# Patient Record
Sex: Female | Born: 1985
Health system: Southern US, Community
[De-identification: ages and names within clinical notes are randomized; demographics above are authoritative.]

## PROBLEM LIST (undated history)

## (undated) DIAGNOSIS — F419 Anxiety disorder, unspecified: Secondary | ICD-10-CM

## (undated) DIAGNOSIS — I10 Essential (primary) hypertension: Secondary | ICD-10-CM

## (undated) HISTORY — PX: LEEP: SHX91

## (undated) HISTORY — PX: COLPOSCOPY: SHX161

---

## 2007-09-13 ENCOUNTER — Emergency Department (HOSPITAL_COMMUNITY): Admission: EM | Admit: 2007-09-13 | Discharge: 2007-09-13 | Payer: Self-pay | Admitting: Emergency Medicine

## 2008-07-02 ENCOUNTER — Emergency Department (HOSPITAL_COMMUNITY): Admission: EM | Admit: 2008-07-02 | Discharge: 2008-07-02 | Payer: Self-pay | Admitting: Emergency Medicine

## 2008-10-20 ENCOUNTER — Emergency Department (HOSPITAL_COMMUNITY): Admission: EM | Admit: 2008-10-20 | Discharge: 2008-10-20 | Payer: Self-pay | Admitting: Family Medicine

## 2009-05-24 ENCOUNTER — Emergency Department (HOSPITAL_COMMUNITY): Admission: EM | Admit: 2009-05-24 | Discharge: 2009-05-25 | Payer: Self-pay | Admitting: Emergency Medicine

## 2009-05-31 ENCOUNTER — Inpatient Hospital Stay (HOSPITAL_COMMUNITY): Admission: AD | Admit: 2009-05-31 | Discharge: 2009-06-01 | Payer: Self-pay | Admitting: Obstetrics and Gynecology

## 2009-07-08 DIAGNOSIS — I10 Essential (primary) hypertension: Secondary | ICD-10-CM | POA: Insufficient documentation

## 2009-07-08 LAB — CONVERTED CEMR LAB

## 2009-07-12 ENCOUNTER — Encounter (INDEPENDENT_AMBULATORY_CARE_PROVIDER_SITE_OTHER): Payer: Self-pay | Admitting: Nurse Practitioner

## 2009-07-14 ENCOUNTER — Emergency Department (HOSPITAL_COMMUNITY): Admission: EM | Admit: 2009-07-14 | Discharge: 2009-07-14 | Payer: Self-pay | Admitting: Emergency Medicine

## 2009-10-14 ENCOUNTER — Ambulatory Visit: Payer: Self-pay | Admitting: Nurse Practitioner

## 2009-10-14 DIAGNOSIS — F17200 Nicotine dependence, unspecified, uncomplicated: Secondary | ICD-10-CM | POA: Insufficient documentation

## 2009-10-20 ENCOUNTER — Encounter (INDEPENDENT_AMBULATORY_CARE_PROVIDER_SITE_OTHER): Payer: Self-pay | Admitting: Nurse Practitioner

## 2009-10-20 LAB — CONVERTED CEMR LAB
ALT: 12 units/L (ref 0–35)
AST: 17 units/L (ref 0–37)
Albumin: 4.3 g/dL (ref 3.5–5.2)
Alkaline Phosphatase: 47 units/L (ref 39–117)
BUN: 10 mg/dL (ref 6–23)
CO2: 17 meq/L — ABNORMAL LOW (ref 19–32)
Calcium: 8.5 mg/dL (ref 8.4–10.5)
Chloride: 108 meq/L (ref 96–112)
Cholesterol: 126 mg/dL (ref 0–200)
Creatinine, Ser: 0.72 mg/dL (ref 0.40–1.20)
Glucose, Bld: 84 mg/dL (ref 70–99)
HDL: 40 mg/dL (ref >39)
LDL Cholesterol: 77 mg/dL (ref 0–99)
Microalb, Ur: 0.5 mg/dL (ref 0.00–1.89)
Potassium: 4.3 meq/L (ref 3.5–5.3)
Sodium: 137 meq/L (ref 135–145)
TSH: 1.123 microintl units/mL (ref 0.350–4.500)
Total Bilirubin: 0.2 mg/dL — ABNORMAL LOW (ref 0.3–1.2)
Total CHOL/HDL Ratio: 3.2
Total Protein: 7 g/dL (ref 6.0–8.3)
Triglycerides: 47 mg/dL (ref <150)
VLDL: 9 mg/dL (ref 0–40)

## 2009-11-11 ENCOUNTER — Ambulatory Visit: Payer: Self-pay | Admitting: Nurse Practitioner

## 2010-02-02 ENCOUNTER — Ambulatory Visit: Payer: Self-pay | Admitting: Nurse Practitioner

## 2010-04-26 ENCOUNTER — Ambulatory Visit: Payer: Self-pay | Admitting: Nurse Practitioner

## 2010-05-22 ENCOUNTER — Emergency Department (HOSPITAL_COMMUNITY): Admission: EM | Admit: 2010-05-22 | Discharge: 2010-05-22 | Payer: Self-pay | Admitting: Family Medicine

## 2010-07-05 ENCOUNTER — Ambulatory Visit: Payer: Self-pay | Admitting: Nurse Practitioner

## 2010-07-05 LAB — CONVERTED CEMR LAB
Basophils Absolute: 0 10*3/uL (ref 0.0–0.1)
Basophils Relative: 0 % (ref 0–1)
Bilirubin Urine: NEGATIVE
Blood in Urine, dipstick: NEGATIVE
Chlamydia, DNA Probe: NEGATIVE
Eosinophils Absolute: 0.1 10*3/uL (ref 0.0–0.7)
Eosinophils Relative: 1 % (ref 0–5)
GC Probe Amp, Genital: NEGATIVE
Glucose, Urine, Semiquant: NEGATIVE
HCT: 36.9 % (ref 36.0–46.0)
Hemoglobin: 12.3 g/dL (ref 12.0–15.0)
KOH Prep: NEGATIVE
Ketones, urine, test strip: NEGATIVE
Lymphocytes Relative: 49 % — ABNORMAL HIGH (ref 12–46)
Lymphs Abs: 3.8 10*3/uL (ref 0.7–4.0)
MCHC: 33.3 g/dL (ref 30.0–36.0)
MCV: 86 fL (ref 78.0–100.0)
Monocytes Absolute: 0.6 10*3/uL (ref 0.1–1.0)
Monocytes Relative: 8 % (ref 3–12)
Neutro Abs: 3.2 10*3/uL (ref 1.7–7.7)
Neutrophils Relative %: 42 % — ABNORMAL LOW (ref 43–77)
Nitrite: NEGATIVE
Platelets: 224 10*3/uL (ref 150–400)
Protein, U semiquant: NEGATIVE
RBC: 4.29 M/uL (ref 3.87–5.11)
RDW: 15 % (ref 11.5–15.5)
Specific Gravity, Urine: <1.005
Urobilinogen, UA: 0.2
WBC Urine, dipstick: NEGATIVE
WBC: 7.7 10*3/uL (ref 4.0–10.5)
pH: 6

## 2010-07-06 ENCOUNTER — Encounter (INDEPENDENT_AMBULATORY_CARE_PROVIDER_SITE_OTHER): Payer: Self-pay | Admitting: Nurse Practitioner

## 2010-07-11 LAB — CONVERTED CEMR LAB: Pap Smear: NEGATIVE

## 2010-07-18 ENCOUNTER — Ambulatory Visit: Payer: Self-pay | Admitting: Nurse Practitioner

## 2010-10-06 ENCOUNTER — Telehealth (INDEPENDENT_AMBULATORY_CARE_PROVIDER_SITE_OTHER): Payer: Self-pay | Admitting: Nurse Practitioner

## 2010-10-13 ENCOUNTER — Ambulatory Visit
Admission: RE | Admit: 2010-10-13 | Discharge: 2010-10-13 | Payer: Self-pay | Source: Home / Self Care | Attending: Nurse Practitioner | Admitting: Nurse Practitioner

## 2010-11-09 NOTE — Assessment & Plan Note (Signed)
Summary: Complete Physical Exam   Vital Signs:  Patient profile:   25 year old female Menstrual status:  depo x 6 years no cycle Weight:      149.1 pounds Temp:     98.7 degrees F oral Pulse rate:   88 / minute Pulse rhythm:   regular Resp:     20 per minute BP sitting:   130 / 76  (left arm) Cuff size:   regular  Vitals Entered By: Levon Hedger (July 05, 2010 9:36 AM) CC: CPP Is Patient Diabetic? No Pain Assessment Patient in pain? no       Does patient need assistance? Functional Status Self care Ambulation Normal   CC:  CPP.  History of Present Illness:  Pt into the office for a complete physical exam.  PAP - last done 1 year ago at [facility]. Pt had some abnormal cells for which returned normal.  No children Pt is sexually active Family Planning - pt is on depoprovera.  No menses.  started in 2005 No family history of cervical or ovarian cancer.  Mammogram - no history of mammogram Pt does do self breast exams at home no family history of breast cancer  Optho - pt does not wear glasses, no current problems with her eyes.  Dental - upcoming dental appt.  last dental appt 4 months ago. she maintains appt every 6 months  Habits & Providers  Alcohol-Tobacco-Diet     Alcohol drinks/day: <1     Alcohol Counseling: to decrease amount and/or frequency of alcohol intake     Tobacco Status: current     Tobacco Counseling: to quit use of tobacco products     Cigarette Packs/Day: <0.25  Exercise-Depression-Behavior     Have you felt down or hopeless? no     Have you felt little pleasure in things? no     Depression Counseling: not indicated; screening negative for depression     Drug Use: never  Comments: PHQ-9 scan 1  Allergies (verified): No Known Drug Allergies  Review of Systems General:  Denies fever. Eyes:  Denies discharge. ENT:  Denies earache. CV:  Denies chest pain or discomfort. Resp:  Denies cough. GI:  Denies abdominal pain, nausea,  and vomiting. Derm:  Denies rash. Neuro:  Denies headaches. Psych:  Denies anxiety and depression.  Physical Exam  General:  alert.  alert.   Head:  normocephalic.  normocephalic.   Eyes:  pupils equal, pupils round, and pupils reactive to light.  pupils equal, pupils round, and pupils reactive to light.   Ears:  bil TM with bony landmarks Nose:  no nasal discharge.  no nasal discharge.   Mouth:  pharynx pink and moist.  pharynx pink and moist.   Neck:  supple.  supple.   Chest Wall:  no mass.  no mass.   Breasts:  skin/areolae normal, no masses, and no abnormal thickening.  skin/areolae normal, no masses, and no abnormal thickening.   Lungs:  normal breath sounds.  normal breath sounds.   Heart:  normal rate and regular rhythm.  normal rate and regular rhythm.   Abdomen:  soft, non-tender, and normal bowel sounds.  soft, non-tender, and normal bowel sounds.   Rectal:  defer Msk:  up to the exam table Pulses:  R radial normal and L radial normal.  R radial normal and L radial normal.   Neurologic:  alert & oriented X3, cranial nerves II-XII intact, and gait normal.  alert & oriented X3, cranial nerves  II-XII intact, and gait normal.   Skin:  color normal.  color normal.   Psych:  Oriented X3.  Oriented X3.    Pelvic Exam  Vulva:      normal appearance.   Urethra and Bladder:      Urethra--normal.   Vagina:      physiologic discharge, curdlike discharge.   Cervix:      midposition.   Uterus:      smooth.   Adnexa:      nontender bilaterally.      Impression & Recommendations:  Problem # 1:  ROUTINE GYNECOLOGICAL EXAMINATION (ICD-V72.31) PHQ- 9 score = 1 labs done previously; will repeat cbc rec optho and dental results  Problem # 2:  HYPERTENSION, BENIGN ESSENTIAL (ICD-401.1)  BP is doing better. Continue current medications DASH diet Her updated medication list for this problem includes:    Lisinopril 10 Mg Tabs (Lisinopril) ..... One tablet by mouth for blood  pressure  Her updated medication list for this problem includes:    Lisinopril 10 Mg Tabs (Lisinopril) ..... One tablet by mouth for blood pressure  Problem # 3:  TOBACCO ABUSE (ICD-305.1) advised total cessation  Problem # 4:  FAMILY PLANNING (ICD-V25.09) pt is on depoprovera answered questions related to long term use pt advised about smoking and depoprovera She would like to transition to oral contraceptives but would like to research further advised condom use to protect against STD  Complete Medication List: 1)  Lisinopril 10 Mg Tabs (Lisinopril) .... One tablet by mouth for blood pressure 2)  Depo-provera 150 Mg/ml Susp (Medroxyprogesterone acetate) .... One injection im  every 12 weeks  Other Orders: UA Dipstick w/o Micro (manual) (91478) KOH/ WET Mount [phone]) Pap Smear, Thin Prep ( Collection of) (Z3086) T-CBC w/Diff [phone]) T- GC Chlamydia (28413)  Patient Instructions: 1)  Depoprovera has been sent to [facility] pharmacy.  You should get this before your next injection is due. 2)  You will be informed of your lab results 3)  follow up as needed Prescriptions: LISINOPRIL 10 MG TABS (LISINOPRIL) One tablet by mouth for blood pressure  #30 x 5   Entered and Authorized by:   Lehman Prom FNP   Signed by:   Lehman Prom FNP on 07/05/2010   Method used:   Print then Give to Patient   RxID:   [id number] DEPO-PROVERA 150 MG/ML SUSP (MEDROXYPROGESTERONE ACETATE) One injection IM  every 12 weeks  #1 x 4   Entered and Authorized by:   Lehman Prom FNP   Signed by:   Lehman Prom FNP on 07/05/2010   Method used:   Faxed to ...       [facility] (retail)       [address] [place], [place]  [address]       Ph: [phone]       Fax: [phone]   RxID:   [id number]   Laboratory Results   Urine Tests  Date/Time Received: July 05, 2010 9:44 AM   Routine Urinalysis   Color: lt.  yellow Appearance: Clear Glucose: negative   (Normal Range: Negative) Bilirubin: negative   (Normal Range: Negative) Ketone: negative   (Normal Range: Negative) Spec. Gravity: <1.005   (Normal Range: 1.003-1.035) Blood: negative   (Normal Range: Negative) pH: 6.0   (Normal Range: 5.0-8.0) Protein: negative   (Normal Range: Negative) Urobilinogen: 0.2   (Normal Range: 0-1) Nitrite: negative   (Normal Range: Negative)  Leukocyte Esterace: negative   (Normal Range: Negative)    Date/Time Received: July 05, 2010 10:35 AM   Wet Mount/KOH Source: vaginal WBC/hpf: 1-5 Bacteria/hpf: rare Clue cells/hpf: none Yeast/hpf: few Trichomonas/hpf: none   Prevention & Chronic Care Immunizations   Influenza vaccine: Fluvax 3+  (10/14/2009)    Tetanus booster: Not documented    Pneumococcal vaccine: Not documented  Other Screening   Pap smear: done at [facility]  (07/08/2009)   Pap smear action/deferral: Ordered  (07/05/2010)   Pap smear due: 07/06/2011   Smoking status: current  (07/05/2010)   Smoking cessation counseling: yes  (07/05/2010)  Hypertension   Last Blood Pressure: 130 / 76  (07/05/2010)   Serum creatinine: 0.72  (10/14/2009)   Serum potassium 4.3  (10/14/2009)  Self-Management Support :    Hypertension self-management support: Not documented   Laboratory Results   Urine Tests    Routine Urinalysis   Color: lt. yellow Appearance: Clear Glucose: negative   (Normal Range: Negative) Bilirubin: negative   (Normal Range: Negative) Ketone: negative   (Normal Range: Negative) Spec. Gravity: <1.005   (Normal Range: 1.003-1.035) Blood: negative   (Normal Range: Negative) pH: 6.0   (Normal Range: 5.0-8.0) Protein: negative   (Normal Range: Negative) Urobilinogen: 0.2   (Normal Range: 0-1) Nitrite: negative   (Normal Range: Negative) Leukocyte Esterace: negative   (Normal Range: Negative)      Wet Mount Wet Mount KOH: Negative

## 2010-11-09 NOTE — Assessment & Plan Note (Signed)
Summary: sched on Feb 4th for bp and depo provera//gk  Nurse Visit   Vital Signs:  Patient profile:   25 year old female Menstrual status:  depo x 6 years no cycle BP sitting:   138 / 82  (left arm) Cuff size:   regular  Vitals Entered By: Geanie Cooley (November 11, 2009 9:41 AM)  Allergies: No Known Drug Allergies  Medication Administration  Injection # 1:    Medication: Depo-Provera 150mg     Diagnosis: FAMILY PLANNING (ICD-V25.09)    Route: IM    Site: LUOQ gluteus    Exp Date: 10/08/2011    Lot #: [id number]    Mfr: Pharmacia    Comments: [id number] Next shot February 02 2010    Patient tolerated injection without complications    Given by: Vesta Mixer CMA (November 11, 2009 10:31 AM)  Orders Added: 1)  Admin of Therapeutic Inj  intramuscular or subcutaneous [96372] 2)  Depo-Provera 150mg  [J1055] 3)  Est. Patient Level I [82956] 4)  EKG w/ Interpretation [93000]   Medication Administration  Injection # 1:    Medication: Depo-Provera 150mg     Diagnosis: FAMILY PLANNING (ICD-V25.09)    Route: IM    Site: LUOQ gluteus    Exp Date: 10/08/2011    Lot #: [id number]    Mfr: Pharmacia    Comments: [id number] Next shot February 02 2010    Patient tolerated injection without complications    Given by: Vesta Mixer CMA (November 11, 2009 10:31 AM)  Orders Added: 1)  Admin of Therapeutic Inj  intramuscular or subcutaneous [96372] 2)  Depo-Provera 150mg  [J1055] 3)  Est. Patient Level I [29528] 4)  EKG w/ Interpretation [93000]

## 2010-11-09 NOTE — Assessment & Plan Note (Signed)
Summary: DEPO/////KT  Nurse Visit   Allergies: No Known Drug Allergies  Medication Administration  Injection # 1:    Medication: Depo-Provera 150mg     Diagnosis: FAMILY PLANNING (ICD-V25.09)    Route: IM    Site: L deltoid    Exp Date: 02/2013    Lot #: [id number]    Mfr: greenstone    Comments: [facility] [id number]       March 30 next inj due.    Patient tolerated injection without complications    Given by: Gaylyn Cheers RN (October 13, 2010 10:58 AM)  Orders Added: 1)  Admin of patients own med IM/SQ [96372M] 2)  Est. Patient Level I [99211]   Medication Administration  Injection # 1:    Medication: Depo-Provera 150mg     Diagnosis: FAMILY PLANNING (ICD-V25.09)    Route: IM    Site: L deltoid    Exp Date: 02/2013    Lot #: [id number]    Mfr: greenstone    Comments: [facility] [id number]       March 30 next inj due.    Patient tolerated injection without complications    Given by: Gaylyn Cheers RN (October 13, 2010 10:58 AM)  Orders Added: 1)  Admin of patients own med IM/SQ [96372M] 2)  Est. Patient Level I [phone]

## 2010-11-09 NOTE — Assessment & Plan Note (Signed)
Summary: DEPO////KT  Nurse Visit   Vitals Entered By: Michelle Nasuti (July 18, 2010 10:31 AM)  Allergies: No Known Drug Allergies  Medication Administration  Injection # 1:    Medication: Depo-Provera 150mg     Diagnosis: FAMILY PLANNING (ICD-V25.09)    Route: IM    Site: RUOQ gluteus    Exp Date: 12/06/2010    Lot #: [id number]    Mfr: TEVA    Comments: NEXT DOSE DUE 10/09/2010    Patient tolerated injection without complications    Given by: Michelle Nasuti (July 18, 2010 10:33 AM)  Orders Added: 1)  Depo-Provera 150mg  [J1055] 2)  Admin of Therapeutic Inj  intramuscular or subcutaneous [96372]   Medication Administration  Injection # 1:    Medication: Depo-Provera 150mg     Diagnosis: FAMILY PLANNING (ICD-V25.09)    Route: IM    Site: RUOQ gluteus    Exp Date: 12/06/2010    Lot #: [id number]    Mfr: TEVA    Comments: NEXT DOSE DUE 10/09/2010    Patient tolerated injection without complications    Given by: Michelle Nasuti (July 18, 2010 10:33 AM)  Orders Added: 1)  Depo-Provera 150mg  [J1055] 2)  Admin of Therapeutic Inj  intramuscular or subcutaneous [78469]

## 2010-11-09 NOTE — Letter (Signed)
Summary: RECORDED BLOOD PRESSURE BY PT.  RECORDED BLOOD PRESSURE BY PT.   Imported By: Arta Bruce 11/29/2009 11:40:03  _____________________________________________________________________  External Attachment:    Type:   Image     Comment:   External Document

## 2010-11-09 NOTE — Letter (Signed)
Summary: PT INFORMATION SHEET  PT INFORMATION SHEET   Imported By: Arta Bruce 12/22/2009 10:45:06  _____________________________________________________________________  External Attachment:    Type:   Image     Comment:   External Document

## 2010-11-09 NOTE — Assessment & Plan Note (Signed)
Summary: New - Establish Care   Vital Signs:  Patient profile:   24 year old female Menstrual status:  depo x 6 years no cycle Height:      63.25 inches Weight:      141.4 pounds BMI:     24.94 BSA:     1.68 Temp:     97.7 degrees F oral Pulse rate:   74 / minute Pulse rhythm:   regular Resp:     20 per minute BP sitting:   133 / 92  (left arm) Cuff size:   regular  Vitals Entered By: Levon Hedger (October 14, 2009 10:30 AM)  O2 Flow:  de CC: new HTN, Hypertension Management Is Patient Diabetic? No Pain Assessment Patient in pain? no       Does patient need assistance? Functional Status Self care Ambulation Normal     Menstrual Status depo x 6 years no cycle Last PAP Result done at [facility]   CC:  new HTN and Hypertension Management.  History of Present Illness:  Pt into the office to establish care. Pt did not have a PCP. [facility]  Family Planning - Pt has been on Depoprovera since 2005  Currently on Depoprovera (presents with card from [facility] department with depoprovera injections) Last was done on 08/19/2009 Next depoprovera was 2/4/22011  Hypertension History:      She denies headache, chest pain, and palpitations.  Pt was prescribed lisinopril/HCTZ 20/12.5 in October and she took it for 1 month and then did not go to f/u appt so she did not take it.        Positive major cardiovascular risk factors include hypertension and current tobacco user.  Negative major cardiovascular risk factors include female age less than 3 years old.     Habits & Providers  Alcohol-Tobacco-Diet     Alcohol drinks/day: <1     Tobacco Status: current     Tobacco Counseling: to quit use of tobacco products     Cigarette Packs/Day: <0.25  Exercise-Depression-Behavior     Have you felt down or hopeless? no     Have you felt little pleasure in things? no     Drug Use: never  Allergies (verified): No Known Drug Allergies  Past  History:  Past Surgical History: none  Family History: father - deceased (murder) mother - healthy  Social History: no children Smoking Status:  current Drug Use:  never Packs/Day:  <0.25  Review of Systems General:  Denies fever. CV:  Denies chest pain or discomfort. Resp:  Denies cough. GI:  Denies abdominal pain, nausea, and vomiting.  Physical Exam  General:  alert.   Head:  normocephalic.   Ears:  ear piercing(s) noted.   Lungs:  normal breath sounds.   Heart:  normal rate and regular rhythm.   Abdomen:  normal bowel sounds.   Msk:  normal ROM.   Neurologic:  alert & oriented X3.     Impression & Recommendations:  Problem # 1:  HYPERTENSION, BENIGN ESSENTIAL (ICD-401.1) pt has started lifestyle modifications will check labs continue bp meds Her updated medication list for this problem includes:    Lisinopril 10 Mg Tabs (Lisinopril) ..... One tablet by mouth for blood pressure  Orders: T-TSH ([address]) T-Comprehensive Metabolic Panel [phone]) T-Lipid Profile (29562-13086) T-CBC w/Diff (57846-96295) T-Syphilis Test (RPR) (28413-24401) Rapid HIV  (92370) UA Dipstick w/o Micro (manual) (02725) T-Urine Microalbumin w/creat. ratio [phone])  Problem # 2:  TOBACCO ABUSE (ICD-305.1) advised cessation  Problem # 3:  FAMILY PLANNING (ICD-V25.09) spoke at length to pt about depoprovera, risk factors with htn and smoking  Complete Medication List: 1)  Lisinopril 10 Mg Tabs (Lisinopril) .... One tablet by mouth for blood pressure 2)  Depo-provera 150 Mg/ml Susp (Medroxyprogesterone acetate)  Hypertension Assessment/Plan:      The patient's hypertensive risk group is category B: At least one risk factor (excluding diabetes) with no target organ damage.  Today's blood pressure is 133/92.  Her blood pressure goal is < 140/90.  Patient Instructions: 1)  Schedule your next appointment on February 4th for high blood pressure and depoprovera 2)   Take your medications before this visit. 3)  You will get an EKG. 4)  You have recieved a flu vaccine today 5)  Stop Smoking Prescriptions: LISINOPRIL 10 MG TABS (LISINOPRIL) One tablet by mouth for blood pressure  #30 x 1   Entered and Authorized by:   Lehman Prom FNP   Signed by:   Lehman Prom FNP on 10/14/2009   Method used:   Print then Give to Patient   RxID:   [id number]   Appended Document: New - Establish Care     Allergies: No Known Drug Allergies   Complete Medication List: 1)  Lisinopril 10 Mg Tabs (Lisinopril) .... One tablet by mouth for blood pressure 2)  Depo-provera 150 Mg/ml Susp (Medroxyprogesterone acetate)  Other Orders: Flu Vaccine 35yrs + (95284) Admin 1st Vaccine (13244) Admin 1st Vaccine [facility]) [phone])    Influenza Vaccine    Vaccine Type: Fluvax 3+    Site: left deltoid    Mfr: Sanofi Pasteur    Dose: 0.5 ml    Route: IM    Given by: Levon Hedger    Exp. Date: 04/06/2010    Lot #: [id number]    VIS given: 05/01/07 version given October 14, 2009.  Flu Vaccine Consent Questions    Do you have a history of severe allergic reactions to this vaccine? no    Any prior history of allergic reactions to egg and/or gelatin? no    Do you have a sensitivity to the preservative Thimersol? no    Do you have a past history of Guillan-Barre Syndrome? no    Do you currently have an acute febrile illness? no    Have you ever had a severe reaction to latex? no    Vaccine information given and explained to patient? yes    Are you currently pregnant? no   Laboratory Results  Date/Time Received: October 14, 2009 4:50 PM  Date/Time Reported: October 14, 2009 4:50 PM   Other Tests  Rapid HIV: negative

## 2010-11-09 NOTE — Progress Notes (Signed)
Summary: Office Visit//DEPRESSION SCREENING  Office Visit//DEPRESSION SCREENING   Imported By: Arta Bruce 07/06/2010 11:45:33  _____________________________________________________________________  External Attachment:    Type:   Image     Comment:   External Document

## 2010-11-09 NOTE — Progress Notes (Signed)
Summary: Needs Depo refills  Phone Note Call from Patient   Summary of Call: PT NEEDS HER DEPO CALLED INTO [address]//[phone] Initial call taken by: Arta Bruce,  October 06, 2010 3:26 PM  Follow-up for Phone Call        Pt. still has refills available.  Left message on answering machine for pt. to return call.  Dutch Quint RN  October 06, 2010 4:01 PM  Left message on answering machine for pt. to return call.  Dutch Quint RN  October 10, 2010 3:32 PM  Pt. advised of available refills -- she will call [place] pharmacy to fill for 10/13/10 depo appt.  Dutch Quint RN  October 10, 2010 4:16 PM

## 2010-11-09 NOTE — Letter (Signed)
Summary: SLIDING SCALE FEE  SLIDING SCALE FEE   Imported By: Arta Bruce 12/22/2009 12:24:42  _____________________________________________________________________  External Attachment:    Type:   Image     Comment:   External Document

## 2010-11-09 NOTE — Assessment & Plan Note (Signed)
Summary: DEPO////////KT  Nurse Visit   Allergies: No Known Drug Allergies  Medication Administration  Injection # 1:    Medication: Depo-Provera 150mg     Diagnosis: FAMILY PLANNING (ICD-V25.09)    Route: IM    Site: LUOQ gluteus    Exp Date: 02/2012    Lot #: [id number]    Mfr: Pharmacia    Comments: next depo shot is Oct. 11    Patient tolerated injection without complications    Given by: Arm[place] Shannon (April 26, 2010 8:45 AM)  Orders Added: 1)  Est. Patient Level I [11914] 2)  Admin of Therapeutic Inj  intramuscular or subcutaneous [96372] 3)  Depo-Provera 150mg  [J1055]   Medication Administration  Injection # 1:    Medication: Depo-Provera 150mg     Diagnosis: FAMILY PLANNING (ICD-V25.09)    Route: IM    Site: LUOQ gluteus    Exp Date: 02/2012    Lot #: [id number]    Mfr: Pharmacia    Comments: next depo shot is Oct. 11    Patient tolerated injection without complications    Given by: Armenia Shannon (April 26, 2010 8:45 AM)  Orders Added: 1)  Est. Patient Level I [78295] 2)  Admin of Therapeutic Inj  intramuscular or subcutaneous [96372] 3)  Depo-Provera 150mg  [J1055]

## 2010-11-09 NOTE — Assessment & Plan Note (Signed)
Summary: DEPO SHOT//GK  Nurse Visit   Allergies: No Known Drug Allergies  Medication Administration  Injection # 1:    Medication: Depo-Provera 150mg     Diagnosis: FAMILY PLANNING (ICD-V25.09)    Route: IM    Site: RUOQ gluteus    Exp Date: 03/07/2012    Lot #: [id number]    Mfr: Pharmacia    Comments: next shot due July 20 [id number]    Patient tolerated injection without complications    Given by: Vesta Mixer CMA (February 02, 2010 8:36 AM)  Orders Added: 1)  Depo-Provera 150mg  [J1055] 2)  Admin of Therapeutic Inj  intramuscular or subcutaneous [96372]   Medication Administration  Injection # 1:    Medication: Depo-Provera 150mg     Diagnosis: FAMILY PLANNING (ICD-V25.09)    Route: IM    Site: RUOQ gluteus    Exp Date: 03/07/2012    Lot #: [id number]    Mfr: Pharmacia    Comments: next shot due July 20 [id number]    Patient tolerated injection without complications    Given by: Vesta Mixer CMA (February 02, 2010 8:36 AM)  Orders Added: 1)  Depo-Provera 150mg  [J1055] 2)  Admin of Therapeutic Inj  intramuscular or subcutaneous [96372]  Appended Document: DEPO SHOT//GK   Appended Document: DEPO SHOT//GK

## 2010-11-09 NOTE — Letter (Signed)
Summary: *HSN Results Follow up  [facility]  [address] [place], [place] [address]   Phone: [phone]  Fax: [phone]      10/20/2009   Jyoti Riehl [address] [place], [place]  [address]   Dear  Ms. Roney Jaffe Rispoli,                            ____S.Drinkard,FNP   ____D. Gore,FNP       ____B. McPherson,MD   ____V. Rankins,MD    ____E. Mulberry,MD    __X__N. Daphine Deutscher, FNP  ____D. Reche Dixon, MD    ____K. Philipp Deputy, MD    ____Other     This letter is to inform you that your recent test(s):  _______Pap Smear    __X_____Lab Test     _______X-ray    ____X___ is within acceptable limits  _______ requires a medication change  _______ requires a follow-up lab visit  _______ requires a follow-up visit with your provider   Comments: Labs done during recent office visit are ok.       _________________________________________________________ If you have any questions, please contact our office [phone].                    Sincerely,    Lehman Prom FNP [facility]

## 2010-11-09 NOTE — Letter (Signed)
Summary: *HSN Results Follow up  [facility]  [address] [place], [place] [address]   Phone: [phone]  Fax: [phone]      07/06/2010   Kelsey Joyce [address] [place], [place]  [address]   Dear  Ms. Roney Jaffe Huante,                            ____S.Drinkard,FNP   ____D. Gore,FNP       ____B. McPherson,MD   ____V. Rankins,MD    ____E. Mulberry,MD    __X__N. Daphine Deutscher, FNP  ____D. Reche Dixon, MD    ____K. Philipp Deputy, MD    ____Other     This letter is to inform you that your recent test(s):  _______Pap Smear    ___X____Lab Test     _______X-ray    ___X____ is within acceptable limits  _______ requires a medication change  _______ requires a follow-up lab visit  _______ requires a follow-up visit with your provider   Comments: Labs done during recent office visit are normal.  Pap Smear results _________________________.       _________________________________________________________ If you have any questions, please contact our office [phone].                    Sincerely,    Lehman Prom FNP [facility]

## 2010-12-22 LAB — POCT URINALYSIS DIPSTICK
Bilirubin Urine: NEGATIVE
Glucose, UA: NEGATIVE mg/dL
Ketones, ur: NEGATIVE mg/dL
Nitrite: NEGATIVE
Protein, ur: NEGATIVE mg/dL
Specific Gravity, Urine: 1.015 (ref 1.005–1.030)
Urobilinogen, UA: 0.2 mg/dL (ref 0.0–1.0)
pH: 6 (ref 5.0–8.0)

## 2010-12-22 LAB — POCT PREGNANCY, URINE: Preg Test, Ur: NEGATIVE

## 2011-01-13 LAB — DIFFERENTIAL
Basophils Absolute: 0 10*3/uL (ref 0.0–0.1)
Basophils Relative: 0 % (ref 0–1)
Eosinophils Absolute: 0 10*3/uL (ref 0.0–0.7)
Eosinophils Relative: 1 % (ref 0–5)
Lymphocytes Relative: 54 % — ABNORMAL HIGH (ref 12–46)
Lymphs Abs: 4.6 10*3/uL — ABNORMAL HIGH (ref 0.7–4.0)
Monocytes Absolute: 0.7 10*3/uL (ref 0.1–1.0)
Monocytes Relative: 8 % (ref 3–12)
Neutro Abs: 3.2 10*3/uL (ref 1.7–7.7)
Neutrophils Relative %: 37 % — ABNORMAL LOW (ref 43–77)

## 2011-01-13 LAB — URINE MICROSCOPIC-ADD ON

## 2011-01-13 LAB — CBC
HCT: 35.5 % — ABNORMAL LOW (ref 36.0–46.0)
Hemoglobin: 12 g/dL (ref 12.0–15.0)
MCHC: 33.9 g/dL (ref 30.0–36.0)
MCV: 88.9 fL (ref 78.0–100.0)
Platelets: 177 10*3/uL (ref 150–400)
RBC: 3.99 MIL/uL (ref 3.87–5.11)
RDW: 13.9 % (ref 11.5–15.5)
WBC: 8.6 10*3/uL (ref 4.0–10.5)

## 2011-01-13 LAB — URINALYSIS, ROUTINE W REFLEX MICROSCOPIC
Bilirubin Urine: NEGATIVE
Bilirubin Urine: NEGATIVE
Glucose, UA: NEGATIVE mg/dL
Glucose, UA: NEGATIVE mg/dL
Hgb urine dipstick: NEGATIVE
Ketones, ur: NEGATIVE mg/dL
Ketones, ur: 15 mg/dL — AB
Leukocytes, UA: NEGATIVE
Nitrite: NEGATIVE
Nitrite: NEGATIVE
Protein, ur: NEGATIVE mg/dL
Protein, ur: NEGATIVE mg/dL
Specific Gravity, Urine: 1.014 (ref 1.005–1.030)
Specific Gravity, Urine: >1.03 — ABNORMAL HIGH (ref 1.005–1.030)
Urobilinogen, UA: 1 mg/dL (ref 0.0–1.0)
Urobilinogen, UA: 1 mg/dL (ref 0.0–1.0)
pH: 6.5 (ref 5.0–8.0)
pH: 6 (ref 5.0–8.0)

## 2011-01-13 LAB — PREGNANCY, URINE: Preg Test, Ur: NEGATIVE

## 2011-01-13 LAB — WET PREP, GENITAL: Trich, Wet Prep: NONE SEEN

## 2011-01-13 LAB — POCT PREGNANCY, URINE: Preg Test, Ur: POSITIVE

## 2011-01-13 LAB — ABO/RH: ABO/RH(D): A POS

## 2011-01-13 LAB — GC/CHLAMYDIA PROBE AMP, GENITAL
Chlamydia, DNA Probe: NEGATIVE
GC Probe Amp, Genital: NEGATIVE

## 2011-01-13 LAB — HCG, QUANTITATIVE, PREGNANCY: hCG, Beta Chain, Quant, S: <2 m[IU]/mL (ref <5)

## 2011-02-11 ENCOUNTER — Inpatient Hospital Stay (INDEPENDENT_AMBULATORY_CARE_PROVIDER_SITE_OTHER)
Admission: RE | Admit: 2011-02-11 | Discharge: 2011-02-11 | Disposition: A | Discharge status: Home / Self Care | Payer: Self-pay | Source: Ambulatory Visit | Attending: Family Medicine | Admitting: Family Medicine

## 2011-02-11 ENCOUNTER — Ambulatory Visit (INDEPENDENT_AMBULATORY_CARE_PROVIDER_SITE_OTHER): Payer: Self-pay

## 2011-02-11 DIAGNOSIS — R197 Diarrhea, unspecified: Secondary | ICD-10-CM

## 2011-02-11 DIAGNOSIS — H6690 Otitis media, unspecified, unspecified ear: Secondary | ICD-10-CM

## 2011-02-11 LAB — POCT URINALYSIS DIP (DEVICE)
Bilirubin Urine: NEGATIVE
Glucose, UA: NEGATIVE mg/dL
Ketones, ur: NEGATIVE mg/dL
Nitrite: NEGATIVE
Protein, ur: NEGATIVE mg/dL
Specific Gravity, Urine: 1.025 (ref 1.005–1.030)
Urobilinogen, UA: 0.2 mg/dL (ref 0.0–1.0)
pH: 6 (ref 5.0–8.0)

## 2011-02-11 LAB — POCT PREGNANCY, URINE: Preg Test, Ur: NEGATIVE

## 2011-05-07 ENCOUNTER — Inpatient Hospital Stay (INDEPENDENT_AMBULATORY_CARE_PROVIDER_SITE_OTHER)
Admission: RE | Admit: 2011-05-07 | Discharge: 2011-05-07 | Disposition: A | Discharge status: Home / Self Care | Payer: Self-pay | Source: Ambulatory Visit | Attending: Family Medicine | Admitting: Family Medicine

## 2011-05-07 DIAGNOSIS — J069 Acute upper respiratory infection, unspecified: Secondary | ICD-10-CM

## 2011-07-09 LAB — PREGNANCY, URINE: Preg Test, Ur: NEGATIVE

## 2011-07-09 LAB — HEPATIC FUNCTION PANEL
ALT: 17
AST: 25
Albumin: 4.2
Alkaline Phosphatase: 54
Bilirubin, Direct: 0.1
Indirect Bilirubin: 0.6
Total Bilirubin: 0.7
Total Protein: 7.6

## 2011-07-09 LAB — CBC
HCT: 38.5
Hemoglobin: 12.5
MCHC: 32.4
MCV: 88
Platelets: 181
RBC: 4.37
RDW: 13.7
WBC: 8.4

## 2011-07-09 LAB — DIFFERENTIAL
Basophils Absolute: 0
Basophils Relative: 0
Eosinophils Absolute: 0
Eosinophils Relative: 0
Lymphocytes Relative: 22
Lymphs Abs: 1.9
Monocytes Absolute: 0.2
Monocytes Relative: 2 — ABNORMAL LOW
Neutro Abs: 6.3
Neutrophils Relative %: 75

## 2011-07-09 LAB — BASIC METABOLIC PANEL
BUN: 7
CO2: 20
Calcium: 9.2
Chloride: 113 — ABNORMAL HIGH
Creatinine, Ser: 0.75
GFR calc Af Amer: >60
GFR calc non Af Amer: >60
Glucose, Bld: 97
Potassium: 3.8
Sodium: 144

## 2011-07-09 LAB — URINALYSIS, ROUTINE W REFLEX MICROSCOPIC
Bilirubin Urine: NEGATIVE
Glucose, UA: NEGATIVE
Hgb urine dipstick: NEGATIVE
Ketones, ur: NEGATIVE
Nitrite: NEGATIVE
Protein, ur: NEGATIVE
Specific Gravity, Urine: 1.014
Urobilinogen, UA: 0.2
pH: 6.5

## 2011-07-09 LAB — GC/CHLAMYDIA PROBE AMP, GENITAL
Chlamydia, DNA Probe: NEGATIVE
GC Probe Amp, Genital: NEGATIVE

## 2011-07-09 LAB — WET PREP, GENITAL
Clue Cells Wet Prep HPF POC: NONE SEEN
Trich, Wet Prep: NONE SEEN
WBC, Wet Prep HPF POC: NONE SEEN
Yeast Wet Prep HPF POC: NONE SEEN

## 2011-07-09 LAB — RPR: RPR Ser Ql: NONREACTIVE

## 2011-07-16 LAB — COMPREHENSIVE METABOLIC PANEL
ALT: 16
AST: 20
Albumin: 3.8
Alkaline Phosphatase: 49
BUN: 4 — ABNORMAL LOW
CO2: 21
Calcium: 9.1
Chloride: 106
Creatinine, Ser: 0.77
GFR calc Af Amer: >60
GFR calc non Af Amer: >60
Glucose, Bld: 93
Potassium: 3.5
Sodium: 136
Total Bilirubin: 0.7
Total Protein: 6.7

## 2011-07-16 LAB — DIFFERENTIAL
Basophils Absolute: 0
Basophils Relative: 0
Eosinophils Absolute: 0 — ABNORMAL LOW
Eosinophils Relative: 0
Lymphocytes Relative: 38
Lymphs Abs: 3.3
Monocytes Absolute: 0.7
Monocytes Relative: 8
Neutro Abs: 4.8
Neutrophils Relative %: 54

## 2011-07-16 LAB — URINALYSIS, ROUTINE W REFLEX MICROSCOPIC
Bilirubin Urine: NEGATIVE
Glucose, UA: NEGATIVE
Hgb urine dipstick: NEGATIVE
Ketones, ur: NEGATIVE
Nitrite: NEGATIVE
Protein, ur: NEGATIVE
Specific Gravity, Urine: 1.005
Urobilinogen, UA: 0.2
pH: 6

## 2011-07-16 LAB — PREGNANCY, URINE: Preg Test, Ur: NEGATIVE

## 2011-07-16 LAB — CBC
HCT: 34.2 — ABNORMAL LOW
Hemoglobin: 11.6 — ABNORMAL LOW
MCHC: 33.8
MCV: 86
Platelets: 175
RBC: 3.98
RDW: 14.1
WBC: 8.8

## 2011-07-16 LAB — URINE MICROSCOPIC-ADD ON

## 2011-07-16 LAB — LIPASE, BLOOD: Lipase: 21

## 2011-07-20 ENCOUNTER — Emergency Department (HOSPITAL_COMMUNITY)
Admission: EM | Admit: 2011-07-20 | Discharge: 2011-07-20 | Disposition: A | Discharge status: Home / Self Care | Payer: Self-pay | Attending: Emergency Medicine | Admitting: Emergency Medicine

## 2011-07-20 DIAGNOSIS — T169XXA Foreign body in ear, unspecified ear, initial encounter: Secondary | ICD-10-CM | POA: Insufficient documentation

## 2011-07-20 DIAGNOSIS — I10 Essential (primary) hypertension: Secondary | ICD-10-CM | POA: Insufficient documentation

## 2011-07-20 DIAGNOSIS — IMO0002 Reserved for concepts with insufficient information to code with codable children: Secondary | ICD-10-CM | POA: Insufficient documentation

## 2011-08-21 ENCOUNTER — Other Ambulatory Visit: Payer: Self-pay | Admitting: Internal Medicine

## 2012-02-19 ENCOUNTER — Encounter (HOSPITAL_COMMUNITY): Payer: Self-pay | Admitting: *Deleted

## 2012-02-19 ENCOUNTER — Emergency Department (INDEPENDENT_AMBULATORY_CARE_PROVIDER_SITE_OTHER)
Admission: EM | Admit: 2012-02-19 | Discharge: 2012-02-19 | Disposition: A | Discharge status: Home / Self Care | Payer: Self-pay | Source: Home / Self Care | Attending: Family Medicine | Admitting: Family Medicine

## 2012-02-19 DIAGNOSIS — J302 Other seasonal allergic rhinitis: Secondary | ICD-10-CM

## 2012-02-19 DIAGNOSIS — J309 Allergic rhinitis, unspecified: Secondary | ICD-10-CM

## 2012-02-19 HISTORY — DX: Essential (primary) hypertension: I10

## 2012-02-19 LAB — POCT RAPID STREP A: Streptococcus, Group A Screen (Direct): NEGATIVE

## 2012-02-19 MED ORDER — FLUTICASONE PROPIONATE 50 MCG/ACT NA SUSP: 1.0000 | Freq: Two times a day (BID) | NASAL | Status: DC

## 2012-02-19 MED ORDER — CETIRIZINE HCL 10 MG PO TABS: 10.0000 mg | ORAL_TABLET | Freq: Every day | ORAL | Status: DC

## 2012-02-19 NOTE — ED Provider Notes (Signed)
History     CSN: [id number]  Arrival date & time 02/19/12  1236   First MD Initiated Contact with Patient 02/19/12 1248      Chief Complaint  Patient presents with  . Sore Throat    (Consider location/radiation/quality/duration/timing/severity/associated sxs/prior treatment) Patient is a 26 y.o. female presenting with pharyngitis. The history is provided by the patient.  Sore Throat This is a new problem. The current episode started 6 to 12 hours ago. The problem has not changed since onset.Pertinent negatives include no chest pain and no abdominal pain. The symptoms are aggravated by swallowing.    Past Medical History  Diagnosis Date  . Hypertension     History reviewed. No pertinent past surgical history.  No family history on file.  History  Substance Use Topics  . Smoking status: Current Everyday Smoker  . Smokeless tobacco: Not on file  . Alcohol Use: Yes    OB History    Grav Para Term Preterm Abortions TAB SAB Ect Mult Living                  Review of Systems  Constitutional: Negative for fever and chills.  HENT: Positive for congestion, sore throat, rhinorrhea and postnasal drip.   Respiratory: Negative.   Cardiovascular: Negative for chest pain.  Gastrointestinal: Negative.  Negative for abdominal pain.  Skin: Negative.     Allergies  Review of patient's allergies indicates not on file.  Home Medications   Current Outpatient Rx  Name Route Sig Dispense Refill  . CETIRIZINE HCL 10 MG PO TABS Oral Take 1 tablet (10 mg total) by mouth daily. One tab daily for allergies 30 tablet 1  . FLUTICASONE PROPIONATE 50 MCG/ACT NA SUSP Nasal Place 1 spray into the nose 2 (two) times daily. 1 g 2    BP 156/105  Pulse 76  Temp(Src) 97.6 F (36.4 C) (Oral)  Resp 18  SpO2 100%  Physical Exam  Nursing note and vitals reviewed. Constitutional: She appears well-developed and well-nourished.  HENT:  Right Ear: Tympanic membrane, external ear and ear canal  normal.  Left Ear: External ear and ear canal normal. Tympanic membrane is not injected, not perforated and not erythematous. A middle ear effusion is present.  Ears:    ED Course  Procedures (including critical care time)   Labs Reviewed  POCT RAPID STREP A ([facility] ONLY)   No results found.   1. Seasonal allergies   2. Serous otitis media, left       MDM          Linna Hoff, MD 02/19/12 1435

## 2012-02-19 NOTE — ED Notes (Signed)
Pt  Reports       Symptoms  Of  sorethroat           Which  Started  Last  Pm  -  She  Also  Reports   Swollen  Glands         As  Well  -       Pt  Is  Sitting  Upright on  Exam table  Speaking in  Complete  sentances         Skin is  Warm    Dry

## 2012-02-21 ENCOUNTER — Emergency Department (HOSPITAL_COMMUNITY)
Admission: EM | Admit: 2012-02-21 | Discharge: 2012-02-21 | Disposition: A | Discharge status: Home / Self Care | Payer: Self-pay | Attending: Emergency Medicine | Admitting: Emergency Medicine

## 2012-02-21 ENCOUNTER — Encounter (HOSPITAL_COMMUNITY): Payer: Self-pay | Admitting: *Deleted

## 2012-02-21 DIAGNOSIS — I10 Essential (primary) hypertension: Secondary | ICD-10-CM | POA: Insufficient documentation

## 2012-02-21 DIAGNOSIS — H9202 Otalgia, left ear: Secondary | ICD-10-CM

## 2012-02-21 DIAGNOSIS — H9209 Otalgia, unspecified ear: Secondary | ICD-10-CM | POA: Insufficient documentation

## 2012-02-21 DIAGNOSIS — Z79899 Other long term (current) drug therapy: Secondary | ICD-10-CM | POA: Insufficient documentation

## 2012-02-21 DIAGNOSIS — F17200 Nicotine dependence, unspecified, uncomplicated: Secondary | ICD-10-CM | POA: Insufficient documentation

## 2012-02-21 MED ORDER — NEOMYCIN-POLYMYXIN-HC 3.5-10000-1 OT SUSP: 4.0000 [drp] | Freq: Three times a day (TID) | OTIC | Status: AC

## 2012-02-21 MED ORDER — PSEUDOEPHEDRINE HCL ER 120 MG PO TB12: 120.0000 mg | ORAL_TABLET | Freq: Two times a day (BID) | ORAL | Status: AC

## 2012-02-21 NOTE — ED Provider Notes (Signed)
History     CSN: [id number]  Arrival date & time 02/21/12  2956   First MD Initiated Contact with Patient 02/21/12 [phone]      Chief Complaint  Patient presents with  . Otalgia    (Consider location/radiation/quality/duration/timing/severity/associated sxs/prior treatment) HPI The patient p/w L ear pain.  She notes a long Hx of pain in the ear, "for years" with episodic exacerbations.  Over the past 2-3d she has had progressive discomfort in the l ear.  The sensation is described as pressure-like with audible liquid-like sounds.  The pain is minimally relieved with Cetirizine, which she was prescribed 2d ago at [facility].  No f/c, dysphagia, HA, neck pain, confusion / disorientation or other focal complaints. Past Medical History  Diagnosis Date  . Hypertension     History reviewed. No pertinent past surgical history.  No family history on file.  History  Substance Use Topics  . Smoking status: Current Everyday Smoker  . Smokeless tobacco: Not on file  . Alcohol Use: Yes    OB History    Grav Para Term Preterm Abortions TAB SAB Ect Mult Living                  Review of Systems  All other systems reviewed and are negative.    Allergies  Review of patient's allergies indicates no known allergies.  Home Medications   Current Outpatient Rx  Name Route Sig Dispense Refill  . CETIRIZINE HCL 10 MG PO TABS Oral Take 1 tablet (10 mg total) by mouth daily. One tab daily for allergies 30 tablet 1  . LISINOPRIL 10 MG PO TABS Oral Take 10 mg by mouth daily.    . NEOMYCIN-POLYMYXIN-HC 3.5-10000-1 OT SUSP Left Ear Place 4 drops into the left ear 3 (three) times daily. 7.5 mL 0  . PSEUDOEPHEDRINE HCL ER 120 MG PO TB12 Oral Take 1 tablet (120 mg total) by mouth every 12 (twelve) hours. 10 tablet 0    BP 171/112  Pulse 82  Temp(Src) 98 F (36.7 C) (Oral)  Resp 17  SpO2 99%  Physical Exam  Nursing note and vitals reviewed. Constitutional: She is oriented to person, place, and  time. She appears well-developed and well-nourished. No distress.  HENT:  Head: Normocephalic.  Right Ear: Tympanic membrane, external ear and ear canal normal.  Left Ear: Hearing normal. There is tenderness. Tympanic membrane is injected. Tympanic membrane is not retracted and not bulging. A middle ear effusion is present.  Eyes: Conjunctivae and EOM are normal. Pupils are equal, round, and reactive to light.  Cardiovascular: Normal rate and regular rhythm.   Pulmonary/Chest: Effort normal and breath sounds normal. No stridor.  Neurological: She is alert and oriented to person, place, and time. No cranial nerve deficit.  Skin: Skin is warm.  Psychiatric: She has a normal mood and affect.    ED Course  Procedures (including critical care time)  Labs Reviewed - No data to display No results found.   1. Otalgia of left ear       MDM  This generally well young F presents for the second time in two days w otalgia.  On exam she is in no distress. L ear notable for effusion / injection.  I counselled the patient on the need for medication use, and specialist f/u for this recurrent problem.  Absent e/o distress she was d/c.      Gerhard Munch, MD 02/21/12 [phone]

## 2012-02-21 NOTE — Discharge Instructions (Signed)
It is very important that you have your ear evaluated by a specialist.  Your description of recurrent ear pain and swelling is concerning for a chronic condition.  Please call to make the next available appointment.

## 2012-02-21 NOTE — ED Notes (Signed)
Patient states she was seen at urgent care and treated for sinus infection  . States her ear woke her up hurting worse at 530 am today

## 2012-09-21 ENCOUNTER — Emergency Department (INDEPENDENT_AMBULATORY_CARE_PROVIDER_SITE_OTHER)
Admission: EM | Admit: 2012-09-21 | Discharge: 2012-09-21 | Disposition: A | Discharge status: Home / Self Care | Payer: Self-pay | Source: Home / Self Care

## 2012-09-21 DIAGNOSIS — K2990 Gastroduodenitis, unspecified, without bleeding: Secondary | ICD-10-CM

## 2012-09-21 DIAGNOSIS — K2970 Gastritis, unspecified, without bleeding: Secondary | ICD-10-CM

## 2012-09-21 DIAGNOSIS — H9209 Otalgia, unspecified ear: Secondary | ICD-10-CM

## 2012-09-21 DIAGNOSIS — H9202 Otalgia, left ear: Secondary | ICD-10-CM

## 2012-09-21 DIAGNOSIS — I10 Essential (primary) hypertension: Secondary | ICD-10-CM

## 2012-09-21 MED ORDER — OMEPRAZOLE 20 MG PO CPDR: 20.0000 mg | DELAYED_RELEASE_CAPSULE | Freq: Every day | ORAL | Status: DC

## 2012-09-21 NOTE — ED Provider Notes (Signed)
Medical screening examination/treatment/procedure(s) were performed by non-physician practitioner and as supervising physician I was immediately available for consultation/collaboration.  Raynald Blend, MD 09/21/12 [phone]

## 2012-09-21 NOTE — ED Provider Notes (Signed)
History     CSN: [id number]  Arrival date & time 09/21/12  1208   None     No chief complaint on file.   (Consider location/radiation/quality/duration/timing/severity/associated sxs/prior treatment) HPI Comments: 26 year old female former patient of [facility] presents with discomfort in the left ear. She has had discomfort for at least one year and has been seen for it once or twice in the urgent care. She has a history of having an ear infection in that ear. She is having some discomfort which she describes as popping and pressure feeling. Little to no change in hearing but sometimes he can be dampened.  Her second complaint is that of epigastric discomfort for 2 days. She describes the discomfort in the epigastrium and right upper quadrant. There is mild intermittent nausea but no vomiting or diarrhea. She denies any alcohol consumption but does admit to eating heavy and spicy foods recently. She admits that that has made it worse. She denies pain in the lower abdomen or pelvis. No vaginal discharge, pain or GU symptoms.   Past Medical History  Diagnosis Date  . Hypertension     No past surgical history on file.  No family history on file.  History  Substance Use Topics  . Smoking status: Current Every Day Smoker  . Smokeless tobacco: Not on file  . Alcohol Use: Yes    OB History    Grav Para Term Preterm Abortions TAB SAB Ect Mult Living                  Review of Systems  Constitutional: Negative for fever, activity change and fatigue.  HENT: Positive for ear pain. Negative for drooling, mouth sores, trouble swallowing and voice change.   Respiratory: Negative for cough, shortness of breath and wheezing.   Cardiovascular: Negative for chest pain and palpitations.  Gastrointestinal: Positive for nausea and abdominal pain. Negative for vomiting, constipation and blood in stool.  Genitourinary: Negative.   Musculoskeletal: Negative.   Skin: Negative for color  change, pallor and rash.  Neurological: Negative.  Negative for dizziness, tremors, syncope, facial asymmetry, speech difficulty, light-headedness and headaches.  Psychiatric/Behavioral: Negative.     Allergies  Review of patient's allergies indicates no known allergies.  Home Medications   Current Outpatient Rx  Name  Route  Sig  Dispense  Refill  . CETIRIZINE HCL 10 MG PO TABS   Oral   Take 1 tablet (10 mg total) by mouth daily. One tab daily for allergies   30 tablet   1   . LISINOPRIL 10 MG PO TABS   Oral   Take 10 mg by mouth daily.         Marland Kitchen OMEPRAZOLE 20 MG PO CPDR   Oral   Take 1 capsule (20 mg total) by mouth daily.   30 capsule   0   . PSEUDOEPHEDRINE HCL ER 120 MG PO TB12   Oral   Take 1 tablet (120 mg total) by mouth every 12 (twelve) hours.   10 tablet   0     BP 169/116  Pulse 71  Temp 98.3 F (36.8 C) (Oral)  Resp 17  SpO2 97%  Physical Exam  Nursing note and vitals reviewed. Constitutional: She is oriented to person, place, and time. She appears well-developed and well-nourished. No distress.  HENT:  Head: Normocephalic and atraumatic.  Mouth/Throat: Oropharynx is clear and moist. No oropharyngeal exudate.       Left TM is pearly gray, translucent without  effusion or discoloration. There is mild retraction. Right TM is normal appearing. Oropharynx is clear and moist without exudates swelling or injection  Eyes: EOM are normal. Pupils are equal, round, and reactive to light.  Neck: Normal range of motion. Neck supple.  Cardiovascular: Normal rate and normal heart sounds.   Pulmonary/Chest: Effort normal and breath sounds normal. No respiratory distress. She has no wheezes. She has no rales.  Abdominal: Soft. She exhibits no distension and no mass. There is no tenderness. There is no rebound and no guarding.       There is mild tenderness in the epigastrium and lesser somewhat equivocal discomfort in the right upper quadrant. Murphy sign is  negative. There is no guarding or rebound or palpable masses no discomfort in the left hemiabdomen or inferior to the umbilicus.  Musculoskeletal: Normal range of motion. She exhibits no edema and no tenderness.  Neurological: She is alert and oriented to person, place, and time. No cranial nerve deficit. She exhibits normal muscle tone.  Skin: Skin is warm and dry.  Psychiatric: She has a normal mood and affect.    ED Course  Procedures (including critical care time)  Labs Reviewed - No data to display No results found.   1. Gastritis   2. Otalgia of left ear   3. HTN (hypertension)       MDM  Examination of the bilateral ears are essentially unremarkable. There is mild retraction the left ear but no other abnormalities. Since the problem has been occurring gradually over the past couple years I think is prudent that she followup with an ENT if possible. Her abdominal exam is remarkable only for mild tenderness in the epigastrium. This association with mild nausea and no other symptoms she is more likely having some probable gastritis possibly viral. I would give her a prescription for omeprazole 20 mg daily. She gets worse or has any increase in pain or distribution of pain typically associated with other symptoms such as vomiting, diarrhea, fever or other symptoms she should return to the emergency department. Her blood pressure is elevated I believe she was on an antihypertensive at 1 point is not taking it now. She denies having any neurologic symptoms such as headache problems with vision speech hearing swallowing, balance or other neurologic symptoms. This regard she is stable. On discharge she  will OBTAIN AN APPOINTMENT FOR THE ADULT CLINIC As SHE USED TO BE A [facility]  PATIENT. We also discussed diet for gastritis, no heavy, spicy or greasy foods. Eat and drink in small frequent amounts and increase clear fluid intake.        Hayden Rasmussen, NP 09/21/12 1452  Hayden Rasmussen,  NP 09/21/12 1453

## 2012-11-13 ENCOUNTER — Encounter (HOSPITAL_COMMUNITY): Payer: Self-pay | Admitting: Emergency Medicine

## 2012-11-13 ENCOUNTER — Emergency Department (INDEPENDENT_AMBULATORY_CARE_PROVIDER_SITE_OTHER)
Admission: EM | Admit: 2012-11-13 | Discharge: 2012-11-13 | Disposition: A | Discharge status: Home / Self Care | Payer: Self-pay | Source: Home / Self Care

## 2012-11-13 DIAGNOSIS — N390 Urinary tract infection, site not specified: Secondary | ICD-10-CM

## 2012-11-13 DIAGNOSIS — H9209 Otalgia, unspecified ear: Secondary | ICD-10-CM

## 2012-11-13 DIAGNOSIS — I10 Essential (primary) hypertension: Secondary | ICD-10-CM

## 2012-11-13 DIAGNOSIS — K2990 Gastroduodenitis, unspecified, without bleeding: Secondary | ICD-10-CM

## 2012-11-13 DIAGNOSIS — K2970 Gastritis, unspecified, without bleeding: Secondary | ICD-10-CM

## 2012-11-13 LAB — POCT URINALYSIS DIP (DEVICE)
Bilirubin Urine: NEGATIVE
Glucose, UA: NEGATIVE mg/dL
Ketones, ur: NEGATIVE mg/dL
Leukocytes, UA: NEGATIVE
Nitrite: NEGATIVE
Protein, ur: NEGATIVE mg/dL
Specific Gravity, Urine: >=1.03 (ref 1.005–1.030)
Urobilinogen, UA: 0.2 mg/dL (ref 0.0–1.0)
pH: 6 (ref 5.0–8.0)

## 2012-11-13 LAB — POCT PREGNANCY, URINE: Preg Test, Ur: NEGATIVE

## 2012-11-13 MED ORDER — PHENAZOPYRIDINE HCL 200 MG PO TABS: 200.0000 mg | ORAL_TABLET | Freq: Three times a day (TID) | ORAL | Status: DC | PRN

## 2012-11-13 MED ORDER — LISINOPRIL 40 MG PO TABS: 40.0000 mg | ORAL_TABLET | Freq: Every day | ORAL | Status: DC

## 2012-11-13 MED ORDER — CEPHALEXIN 500 MG PO CAPS: 500.0000 mg | ORAL_CAPSULE | Freq: Three times a day (TID) | ORAL | Status: DC

## 2012-11-13 NOTE — ED Notes (Signed)
Prior to discharge, discussed patient's blood pressure with dr Lorenz Coaster.  Prior to discharge, dr Lorenz Coaster reassessed blood pressure concerns.

## 2012-11-13 NOTE — ED Notes (Signed)
Notified david mabe, np of patient blood pressure

## 2012-11-13 NOTE — ED Notes (Signed)
Called in all three scripts to [facility] on [address].

## 2012-11-13 NOTE — ED Notes (Signed)
Reports uti symptoms for 2 weeks: reports urine with odor, cloudy, urinating frequently and urgency.  Denies vaginal discharge.

## 2012-11-13 NOTE — ED Provider Notes (Signed)
Chief Complaint  Patient presents with  . Urinary Tract Infection    History of Present Illness:   Kelsey Joyce is a 27 year old female who has had a two-week history of urinary symptoms including malodorous urine, cloudy urine, urinary urgency, and frequent small amounts of urine. She denies any dysuria, suprapubic pain, or lower back pain. She's had no fever or chills. She's also been nauseated but not vomited. She has had no prior history of urinary tract infections. She does have high blood pressure and takes lisinopril 10 mg a day, but this does not seem to be controlling her blood pressure. She denies headaches, dizziness, shortness of breath, or chest pain. She is a cigarette smoker.  Review of Systems:  Other than noted above, the patient denies any of the following symptoms: General:  No fevers, chills, sweats, aches, or fatigue. GI:  No abdominal pain, back pain, nausea, vomiting, diarrhea, or constipation. GU:  No dysuria, frequency, urgency, hematuria, or incontinence. GYN:  No discharge, itching, vulvar pain or lesions, pelvic pain, or abnormal vaginal bleeding.  PMFSH:  Past medical history, family history, social history, meds, and allergies were reviewed.  Physical Exam:   Vital signs:  BP 151/115  Pulse 84  Temp 97.9 F (36.6 C) (Oral)  Resp 18  SpO2 100%  LMP 10/21/2012 Gen:  Alert, oriented, in no distress. Lungs:  Clear to auscultation, no wheezes, rales or rhonchi. Heart:  Regular rhythm, no gallop or murmer. Abdomen:  Flat and soft. There was slight suprapubic pain to palpation.  No guarding, or rebound.  No hepato-splenomegaly or mass.  Bowel sounds were normally active.  No hernia. Back:  No CVA tenderness.  Skin:  Clear, warm and dry.  Labs:   Results for orders placed during the hospital encounter of 11/13/12  POCT URINALYSIS DIP (DEVICE)      Component Value Range   Glucose, UA NEGATIVE  NEGATIVE mg/dL   Bilirubin Urine NEGATIVE  NEGATIVE   Ketones,  ur NEGATIVE  NEGATIVE mg/dL   Specific Gravity, Urine >=1.030  1.005 - 1.030   Hgb urine dipstick TRACE (*) NEGATIVE   pH 6.0  5.0 - 8.0   Protein, ur NEGATIVE  NEGATIVE mg/dL   Urobilinogen, UA 0.2  0.0 - 1.0 mg/dL   Nitrite NEGATIVE  NEGATIVE   Leukocytes, UA NEGATIVE  NEGATIVE  POCT PREGNANCY, URINE      Component Value Range   Preg Test, Ur NEGATIVE  NEGATIVE     Other Labs Obtained at Urgent Care Center:  A urine culture was obtained.  Results are pending at this time and we will call about any positive results.  Assessment: The encounter diagnosis was UTI (lower urinary tract infection).   Plan:   1.  The following meds were prescribed:   New Prescriptions   CEPHALEXIN (KEFLEX) 500 MG CAPSULE    Take 1 capsule (500 mg total) by mouth 3 (three) times daily.   LISINOPRIL (PRINIVIL,ZESTRIL) 40 MG TABLET    Take 1 tablet (40 mg total) by mouth daily.   PHENAZOPYRIDINE (PYRIDIUM) 200 MG TABLET    Take 1 tablet (200 mg total) by mouth 3 (three) times daily as needed for pain.   2.  The patient was instructed in symptomatic care and handouts were given. 3.  The patient was told to return if becoming worse in any way, if no better in 3 or 4 days, and given some red flag symptoms that would indicate earlier return. 4.  The patient  was told to avoid intercourse for 10 days, get extra fluids, and return for a follow up with her primary care doctor at the completion of treatment for a repeat UA and culture. I suggested she get back up with her primary care physician for followup on her blood pressure. Her lisinopril was increased to 40 mg a day.    Reuben Likes, MD 11/13/12 2224

## 2012-11-15 LAB — URINE CULTURE: Colony Count: 40000

## 2012-11-20 NOTE — ED Notes (Signed)
Urine culture: 40,000 colonies Citrobacter Koseri.  Pt. adequately treated with keflex. Vassie Moselle 11/20/2012

## 2012-12-02 NOTE — ED Notes (Signed)
Pt  Phoned  That  Her  meds  Were  Stolen      Recently -  She   Stated  That       About 1/2  Of  Her meds  Were   Missing        And  That    She  Also  Needed  Refills  On  Her  bp  meds      Dr  Artis Flock  Notified   Give  pts    15   Pills     Tid  nr  And  Her  lisonopril   Was      authrized  Originally  For  3  Refills    This  Was  Phoned  In to    [facility]   On  [address]    [phone]    And  Spoke with  The  Pharmacist

## 2013-01-05 ENCOUNTER — Emergency Department (INDEPENDENT_AMBULATORY_CARE_PROVIDER_SITE_OTHER)
Admission: EM | Admit: 2013-01-05 | Discharge: 2013-01-05 | Disposition: A | Discharge status: Home / Self Care | Payer: Self-pay | Source: Home / Self Care | Attending: Emergency Medicine | Admitting: Emergency Medicine

## 2013-01-05 ENCOUNTER — Encounter (HOSPITAL_COMMUNITY): Payer: Self-pay | Admitting: Emergency Medicine

## 2013-01-05 ENCOUNTER — Other Ambulatory Visit (HOSPITAL_COMMUNITY)
Admission: RE | Admit: 2013-01-05 | Discharge: 2013-01-05 | Disposition: A | Discharge status: Home / Self Care | Payer: Self-pay | Source: Ambulatory Visit | Attending: Emergency Medicine | Admitting: Emergency Medicine

## 2013-01-05 DIAGNOSIS — N390 Urinary tract infection, site not specified: Secondary | ICD-10-CM

## 2013-01-05 DIAGNOSIS — I10 Essential (primary) hypertension: Secondary | ICD-10-CM

## 2013-01-05 DIAGNOSIS — K2970 Gastritis, unspecified, without bleeding: Secondary | ICD-10-CM

## 2013-01-05 DIAGNOSIS — H9209 Otalgia, unspecified ear: Secondary | ICD-10-CM

## 2013-01-05 DIAGNOSIS — Z113 Encounter for screening for infections with a predominantly sexual mode of transmission: Secondary | ICD-10-CM | POA: Insufficient documentation

## 2013-01-05 DIAGNOSIS — N760 Acute vaginitis: Secondary | ICD-10-CM

## 2013-01-05 DIAGNOSIS — K2990 Gastroduodenitis, unspecified, without bleeding: Secondary | ICD-10-CM

## 2013-01-05 LAB — POCT PREGNANCY, URINE: Preg Test, Ur: NEGATIVE

## 2013-01-05 MED ORDER — METRONIDAZOLE 500 MG PO TABS: 500.0000 mg | ORAL_TABLET | Freq: Two times a day (BID) | ORAL | Status: AC

## 2013-01-05 NOTE — ED Provider Notes (Addendum)
History     CSN: [id number]  Arrival date & time 01/05/13  1509   First MD Initiated Contact with Patient 01/05/13 1556      Chief Complaint  Patient presents with  . Vaginal Discharge    (Consider location/radiation/quality/duration/timing/severity/associated sxs/prior treatment) HPI Comments: Patient presents urgent care describing that since Friday she's been having a vaginal discharge associated with a strong odor. Patient denies any urinary symptoms such as increased frequency burning pressure with urination no pelvic pain nausea vomiting or fevers. Patient expresses that she has very low concern for haven't been exposed to sexually transmitted illness. Denies any antibiotic usage within the last couple weeks.  Patient is a 27 y.o. female presenting with vaginal discharge. The history is provided by the patient.  Vaginal Discharge This is a new problem. The current episode started 2 days ago. The problem occurs constantly. The problem has not changed since onset.Pertinent negatives include no chest pain, no abdominal pain, no headaches and no shortness of breath. Nothing aggravates the symptoms. Nothing relieves the symptoms. She has tried nothing for the symptoms. The treatment provided no relief.    Past Medical History  Diagnosis Date  . Hypertension     History reviewed. No pertinent past surgical history.  History reviewed. No pertinent family history.  History  Substance Use Topics  . Smoking status: Current Every Day Smoker  . Smokeless tobacco: Not on file  . Alcohol Use: Yes    OB History   Grav Para Term Preterm Abortions TAB SAB Ect Mult Living                  Review of Systems  Constitutional: Negative for activity change and appetite change.  Respiratory: Negative for shortness of breath.   Cardiovascular: Negative for chest pain, palpitations and leg swelling.  Gastrointestinal: Negative for abdominal pain.  Genitourinary: Positive for vaginal  discharge. Negative for dysuria, urgency, frequency, hematuria, flank pain, vaginal bleeding, menstrual problem and pelvic pain.  Skin: Negative for color change, pallor and rash.  Neurological: Negative for headaches.    Allergies  Review of patient's allergies indicates no known allergies.  Home Medications   Current Outpatient Rx  Name  Route  Sig  Dispense  Refill  . lisinopril (PRINIVIL,ZESTRIL) 40 MG tablet   Oral   Take 1 tablet (40 mg total) by mouth daily.   30 tablet   3   . cephALEXin (KEFLEX) 500 MG capsule   Oral   Take 1 capsule (500 mg total) by mouth 3 (three) times daily.   30 capsule   0   . cetirizine (ZYRTEC) 10 MG tablet   Oral   Take 1 tablet (10 mg total) by mouth daily. One tab daily for allergies   30 tablet   1   . lisinopril (PRINIVIL,ZESTRIL) 10 MG tablet   Oral   Take 10 mg by mouth daily.         . metroNIDAZOLE (FLAGYL) 500 MG tablet   Oral   Take 1 tablet (500 mg total) by mouth 2 (two) times daily.   14 tablet   0   . omeprazole (PRILOSEC) 20 MG capsule   Oral   Take 1 capsule (20 mg total) by mouth daily.   30 capsule   0   . phenazopyridine (PYRIDIUM) 200 MG tablet   Oral   Take 1 tablet (200 mg total) by mouth 3 (three) times daily as needed for pain.   15 tablet  0   . pseudoephedrine (SUDAFED 12 HOUR) 120 MG 12 hr tablet   Oral   Take 1 tablet (120 mg total) by mouth every 12 (twelve) hours.   10 tablet   0     LMP 11/17/2012  Physical Exam  Constitutional: She appears well-developed and well-nourished.  Non-toxic appearance. She does not have a sickly appearance. She does not appear ill. No distress.  HENT:  Head: Normocephalic.  Abdominal: Normal appearance. There is no tenderness.  Genitourinary: Guaiac negative stool. Vaginal discharge found.  Neurological: She is alert.  Skin: No rash noted. No erythema.    ED Course  Procedures (including critical care time)  Labs Reviewed  POCT PREGNANCY, URINE   URINE CYTOLOGY ANCILLARY ONLY  CERVICOVAGINAL ANCILLARY ONLY   No results found.   1. Vaginitis       MDM  Vaginal discharge- today sample collected for STD screening as well as gardenella and candida screening- patient opted for empirical treatment for bacterial vaginosis. She has been informed that if abnormal results she will be contacted for further treatment       Jimmie Molly, MD 01/05/13 2042  Jimmie Molly, MD 01/08/13 1726

## 2013-01-05 NOTE — ED Notes (Signed)
Pt c/o Vaginal discharge since Friday. Discharge is foul smelling. Denies any burning or itching or blood in urine. Pt is alert and oriented.

## 2013-01-08 ENCOUNTER — Telehealth (HOSPITAL_COMMUNITY): Payer: Self-pay | Admitting: *Deleted

## 2013-01-08 MED ORDER — AZITHROMYCIN 250 MG PO TABS: 1000.0000 mg | ORAL_TABLET | Freq: Once | ORAL | Status: DC

## 2013-01-08 NOTE — ED Notes (Signed)
GC neg., Chlamydia pos., Trich neg., Affirm: Gardnerella and Prevotella Bivia pos. and Candida neg. Pt. adequately treated with Flagyl for bacterial vaginosis. 4/3 Dr. Ladon Applebaum e- prescribed Zithromax 1 gm po to [facility] at [employer]. I called pt. and left a message to call. Vassie Moselle 01/08/2013

## 2013-01-09 ENCOUNTER — Telehealth (HOSPITAL_COMMUNITY): Payer: Self-pay | Admitting: *Deleted

## 2013-01-09 NOTE — ED Notes (Signed)
Pt. called back on VM. I called pt. back.  Pt. verified x 2 and given results.  Pt. told she was adequately treated for bacterial vaginosis with Flagyl.  Pt. told she needs Zithromax for the Chlamydia and it was at the [place] [facility].  Pt. instructed to notify her partner, no sex for 1 week and to practice safe sex. Pt. told she can get HIV testing at the [facility]. STD clinic, by appointment.  DHHS form completed and faxed to the [facility] Department. Vassie Moselle 01/09/2013

## 2013-04-27 ENCOUNTER — Other Ambulatory Visit (HOSPITAL_COMMUNITY)
Admission: RE | Admit: 2013-04-27 | Discharge: 2013-04-27 | Disposition: A | Discharge status: Home / Self Care | Payer: Self-pay | Source: Ambulatory Visit | Attending: Family Medicine | Admitting: Family Medicine

## 2013-04-27 ENCOUNTER — Emergency Department (INDEPENDENT_AMBULATORY_CARE_PROVIDER_SITE_OTHER)
Admission: EM | Admit: 2013-04-27 | Discharge: 2013-04-27 | Disposition: A | Discharge status: Home / Self Care | Payer: Self-pay | Source: Home / Self Care | Attending: Family Medicine | Admitting: Family Medicine

## 2013-04-27 ENCOUNTER — Encounter (HOSPITAL_COMMUNITY): Payer: Self-pay

## 2013-04-27 DIAGNOSIS — N760 Acute vaginitis: Secondary | ICD-10-CM

## 2013-04-27 DIAGNOSIS — Z113 Encounter for screening for infections with a predominantly sexual mode of transmission: Secondary | ICD-10-CM | POA: Insufficient documentation

## 2013-04-27 DIAGNOSIS — L738 Other specified follicular disorders: Secondary | ICD-10-CM

## 2013-04-27 DIAGNOSIS — L678 Other hair color and hair shaft abnormalities: Secondary | ICD-10-CM

## 2013-04-27 LAB — POCT URINALYSIS DIP (DEVICE)
Bilirubin Urine: NEGATIVE
Glucose, UA: NEGATIVE mg/dL
Hgb urine dipstick: NEGATIVE
Leukocytes, UA: NEGATIVE
Nitrite: NEGATIVE
Protein, ur: NEGATIVE mg/dL
Specific Gravity, Urine: 1.025 (ref 1.005–1.030)
Urobilinogen, UA: 0.2 mg/dL (ref 0.0–1.0)
pH: 7 (ref 5.0–8.0)

## 2013-04-27 LAB — POCT PREGNANCY, URINE: Preg Test, Ur: NEGATIVE

## 2013-04-27 MED ORDER — METRONIDAZOLE 500 MG PO TABS: 500.0000 mg | ORAL_TABLET | Freq: Two times a day (BID) | ORAL | Status: DC

## 2013-04-27 MED ORDER — DOXYCYCLINE HYCLATE 100 MG PO CAPS: 100.0000 mg | ORAL_CAPSULE | Freq: Two times a day (BID) | ORAL | Status: DC

## 2013-04-27 NOTE — ED Provider Notes (Addendum)
History    CSN: [id number] Arrival date & time 04/27/13  1839  First MD Initiated Contact with Patient 04/27/13 1842     Chief Complaint  Patient presents with  . SEXUALLY TRANSMITTED DISEASE   (Consider location/radiation/quality/duration/timing/severity/associated sxs/prior Treatment) Patient is a 27 y.o. female presenting with vaginal discharge. The history is provided by the patient.  Vaginal Discharge Quality:  White Severity:  Moderate Duration:  1 week Timing:  Constant Progression:  Worsening Chronicity:  New Context comment:  Assoc with boils on buttocks. Risk factors: STI exposure    Past Medical History  Diagnosis Date  . Hypertension    History reviewed. No pertinent past surgical history. History reviewed. No pertinent family history. History  Substance Use Topics  . Smoking status: Current Every Day Smoker  . Smokeless tobacco: Not on file  . Alcohol Use: Yes   OB History   Grav Para Term Preterm Abortions TAB SAB Ect Mult Living                 Review of Systems  Constitutional: Negative.   Genitourinary: Positive for vaginal discharge.    Allergies  Review of patient's allergies indicates no known allergies.  Home Medications   Current Outpatient Rx  Name  Route  Sig  Dispense  Refill  . azithromycin (ZITHROMAX) 250 MG tablet   Oral   Take 4 tablets (1,000 mg total) by mouth once. Take first 2 tablets together, then 1 every day until finished.   4 tablet   0   . cephALEXin (KEFLEX) 500 MG capsule   Oral   Take 1 capsule (500 mg total) by mouth 3 (three) times daily.   30 capsule   0   . EXPIRED: cetirizine (ZYRTEC) 10 MG tablet   Oral   Take 1 tablet (10 mg total) by mouth daily. One tab daily for allergies   30 tablet   1   . doxycycline (VIBRAMYCIN) 100 MG capsule   Oral   Take 1 capsule (100 mg total) by mouth 2 (two) times daily.   20 capsule   0   . lisinopril (PRINIVIL,ZESTRIL) 10 MG tablet   Oral   Take 10 mg by  mouth daily.         Marland Kitchen lisinopril (PRINIVIL,ZESTRIL) 40 MG tablet   Oral   Take 1 tablet (40 mg total) by mouth daily.   30 tablet   3   . metroNIDAZOLE (FLAGYL) 500 MG tablet   Oral   Take 1 tablet (500 mg total) by mouth 2 (two) times daily.   14 tablet   0   . omeprazole (PRILOSEC) 20 MG capsule   Oral   Take 1 capsule (20 mg total) by mouth daily.   30 capsule   0   . phenazopyridine (PYRIDIUM) 200 MG tablet   Oral   Take 1 tablet (200 mg total) by mouth 3 (three) times daily as needed for pain.   15 tablet   0    BP 171/116  Pulse 78  Temp(Src) 98.3 F (36.8 C) (Oral)  Resp 16  SpO2 100%  LMP 04/10/2013 Physical Exam  Nursing note and vitals reviewed. Constitutional: She is oriented to person, place, and time. She appears well-developed and well-nourished.  Genitourinary: Uterus normal.    Cervix exhibits discharge. Cervix exhibits no motion tenderness. Right adnexum displays no tenderness. Left adnexum displays no tenderness. Vaginal discharge found.  Neurological: She is alert and oriented to person, place, and time.  Skin: Skin is warm and dry.    ED Course  Procedures (including critical care time) Labs Reviewed  POCT URINALYSIS DIP (DEVICE) - Abnormal; Notable for the following:    Ketones, ur TRACE (*)    All other components within normal limits  POCT PREGNANCY, URINE  CERVICOVAGINAL ANCILLARY ONLY   No results found. 1. Vaginitis and vulvovaginitis   2. Bacterial folliculitis     MDM    Linna Hoff, MD 04/27/13 Serena Croissant  Linna Hoff, MD 04/27/13 1944

## 2013-04-27 NOTE — ED Notes (Signed)
rx x 2  called in to [facility], [place] at pt request; left message on answering machine

## 2013-04-27 NOTE — ED Notes (Signed)
Reports she has been having issues w boil on both buttocks and vaginal d/c and odor for for 1 week

## 2013-04-29 NOTE — ED Notes (Signed)
GC/Chlamydia neg., Affirm: Candida and Trich neg., Gardnerella pos.  Pt. adequately treated with Flagyl. Vassie Moselle 04/29/2013

## 2013-10-21 ENCOUNTER — Emergency Department (INDEPENDENT_AMBULATORY_CARE_PROVIDER_SITE_OTHER)
Admission: EM | Admit: 2013-10-21 | Discharge: 2013-10-21 | Disposition: A | Discharge status: Home / Self Care | Payer: Self-pay | Source: Home / Self Care | Attending: Emergency Medicine | Admitting: Emergency Medicine

## 2013-10-21 ENCOUNTER — Other Ambulatory Visit (HOSPITAL_COMMUNITY)
Admission: RE | Admit: 2013-10-21 | Discharge: 2013-10-21 | Disposition: A | Discharge status: Home / Self Care | Payer: No Typology Code available for payment source | Source: Ambulatory Visit | Attending: Emergency Medicine | Admitting: Emergency Medicine

## 2013-10-21 ENCOUNTER — Encounter (HOSPITAL_COMMUNITY): Payer: Self-pay | Admitting: Emergency Medicine

## 2013-10-21 DIAGNOSIS — N760 Acute vaginitis: Secondary | ICD-10-CM

## 2013-10-21 DIAGNOSIS — I10 Essential (primary) hypertension: Secondary | ICD-10-CM

## 2013-10-21 DIAGNOSIS — Z113 Encounter for screening for infections with a predominantly sexual mode of transmission: Secondary | ICD-10-CM | POA: Insufficient documentation

## 2013-10-21 MED ORDER — FLUCONAZOLE 150 MG PO TABS: 150.0000 mg | ORAL_TABLET | Freq: Once | ORAL | Status: DC

## 2013-10-21 NOTE — ED Notes (Signed)
C/o yeast infection States she has discharge and a little irration Admits to vaginal itch

## 2013-10-21 NOTE — ED Provider Notes (Signed)
Medical screening examination/treatment/procedure(s) were performed by non-physician practitioner and as supervising physician I was immediately available for consultation/collaboration.  Geordan Xu, M.D.  Marlee Armenteros C Billy Turvey, MD 10/21/13 2058 

## 2013-10-21 NOTE — ED Provider Notes (Signed)
CSN: [id number]     Arrival date & time 10/21/13  1601 History   First MD Initiated Contact with Patient 10/21/13 1731     No chief complaint on file.  (Consider location/radiation/quality/duration/timing/severity/associated sxs/prior Treatment) Patient is a 28 y.o. female presenting with vaginal discharge. The history is provided by the patient.  Vaginal Discharge Quality:  Thick and white Onset quality:  Gradual Duration:  1 week Timing:  Constant Progression:  Worsening Chronicity:  New Relieved by:  None tried Worsened by:  Nothing tried Ineffective treatments:  None tried Associated symptoms: vaginal itching   Associated symptoms: no abdominal pain, no dyspareunia, no dysuria, no fever, no genital lesions, no nausea, no rash, no urinary frequency, no urinary hesitancy, no urinary incontinence and no vomiting     Past Medical History  Diagnosis Date  . Hypertension    No past surgical history on file. No family history on file. History  Substance Use Topics  . Smoking status: Current Every Day Smoker  . Smokeless tobacco: Not on file  . Alcohol Use: Yes   OB History   Grav Para Term Preterm Abortions TAB SAB Ect Mult Living                 Review of Systems  Constitutional: Negative for fever.  Gastrointestinal: Negative for nausea, vomiting and abdominal pain.  Genitourinary: Positive for vaginal discharge. Negative for bladder incontinence, dysuria, hesitancy and dyspareunia.  All other systems reviewed and are negative.    Allergies  Review of patient's allergies indicates no known allergies.  Home Medications   Current Outpatient Rx  Name  Route  Sig  Dispense  Refill  . azithromycin (ZITHROMAX) 250 MG tablet   Oral   Take 4 tablets (1,000 mg total) by mouth once. Take first 2 tablets together, then 1 every day until finished.   4 tablet   0   . cephALEXin (KEFLEX) 500 MG capsule   Oral   Take 1 capsule (500 mg total) by mouth 3 (three) times  daily.   30 capsule   0   . EXPIRED: cetirizine (ZYRTEC) 10 MG tablet   Oral   Take 1 tablet (10 mg total) by mouth daily. One tab daily for allergies   30 tablet   1   . doxycycline (VIBRAMYCIN) 100 MG capsule   Oral   Take 1 capsule (100 mg total) by mouth 2 (two) times daily.   20 capsule   0   . lisinopril (PRINIVIL,ZESTRIL) 10 MG tablet   Oral   Take 10 mg by mouth daily.         Marland Kitchen. lisinopril (PRINIVIL,ZESTRIL) 40 MG tablet   Oral   Take 1 tablet (40 mg total) by mouth daily.   30 tablet   3   . metroNIDAZOLE (FLAGYL) 500 MG tablet   Oral   Take 1 tablet (500 mg total) by mouth 2 (two) times daily.   14 tablet   0   . omeprazole (PRILOSEC) 20 MG capsule   Oral   Take 1 capsule (20 mg total) by mouth daily.   30 capsule   0   . phenazopyridine (PYRIDIUM) 200 MG tablet   Oral   Take 1 tablet (200 mg total) by mouth 3 (three) times daily as needed for pain.   15 tablet   0    There were no vitals taken for this visit. Physical Exam  Vitals reviewed. Constitutional: She is oriented to person, place, and time.  She appears well-developed and well-nourished. No distress.  Eyes: Conjunctivae are normal. No scleral icterus.  Neck: Normal range of motion. Neck supple.  Cardiovascular: Normal rate.   Pulmonary/Chest: Effort normal.  Abdominal: Soft. Bowel sounds are normal. She exhibits no distension. There is no tenderness.  Genitourinary: Uterus normal. Pelvic exam was performed with patient supine. There is no rash, tenderness, lesion or injury on the right labia. There is no rash, tenderness, lesion or injury on the left labia. Cervix exhibits discharge. Cervix exhibits no motion tenderness and no friability. Right adnexum displays no mass, no tenderness and no fullness. Left adnexum displays no mass, no tenderness and no fullness. No erythema, tenderness or bleeding around the vagina. No signs of injury around the vagina. Vaginal discharge found.  Moderate  swelling of bilateral labia   Musculoskeletal: Normal range of motion.  Neurological: She is alert and oriented to person, place, and time.  Skin: Skin is warm and dry.  Psychiatric: She has a normal mood and affect. Her behavior is normal.    ED Course  Procedures (including critical care time) Labs Review Labs Reviewed - No data to display Imaging Review No results found.  EKG Interpretation    Date/Time:    Ventricular Rate:    PR Interval:    QRS Duration:   QT Interval:    QTC Calculation:   R Axis:     Text Interpretation:              MDM   Exam suggests a yeast vaginitis. Will treat with single dose of Diflucan 150mg . Additional cervicovaginal swabs sent with any additional treatment to be based upon results. Also provided patient with [facility] clinic information as her blood pressure is quite high despite her taking lisinopril. Patient endorses that she has not done a good job of keeping up with her PCP follow up for her HTN. Strongly encouraged to re-establish primary care.   Jess Barters [place], [place] 10/21/13 [phone]

## 2013-10-23 NOTE — ED Notes (Signed)
Gc/Chlamydia neg., Affirm: Candida pos., Gardnerella and Trich neg.  Pt. adequately treated with Diflucan. Vassie MoselleYork, Danton Palmateer M 10/23/2013

## 2014-07-06 ENCOUNTER — Encounter (HOSPITAL_COMMUNITY): Payer: Self-pay | Admitting: Emergency Medicine

## 2014-07-06 ENCOUNTER — Emergency Department (INDEPENDENT_AMBULATORY_CARE_PROVIDER_SITE_OTHER)
Admission: EM | Admit: 2014-07-06 | Discharge: 2014-07-06 | Disposition: A | Discharge status: Home / Self Care | Payer: PRIVATE HEALTH INSURANCE | Source: Home / Self Care | Attending: Family Medicine | Admitting: Family Medicine

## 2014-07-06 ENCOUNTER — Other Ambulatory Visit (HOSPITAL_COMMUNITY)
Admission: RE | Admit: 2014-07-06 | Discharge: 2014-07-06 | Disposition: A | Discharge status: Home / Self Care | Payer: No Typology Code available for payment source | Source: Ambulatory Visit | Attending: Family Medicine | Admitting: Family Medicine

## 2014-07-06 DIAGNOSIS — N760 Acute vaginitis: Secondary | ICD-10-CM | POA: Insufficient documentation

## 2014-07-06 DIAGNOSIS — T700XXA Otitic barotrauma, initial encounter: Secondary | ICD-10-CM

## 2014-07-06 DIAGNOSIS — Z113 Encounter for screening for infections with a predominantly sexual mode of transmission: Secondary | ICD-10-CM | POA: Insufficient documentation

## 2014-07-06 DIAGNOSIS — IMO0002 Reserved for concepts with insufficient information to code with codable children: Secondary | ICD-10-CM

## 2014-07-06 DIAGNOSIS — X58XXXA Exposure to other specified factors, initial encounter: Secondary | ICD-10-CM

## 2014-07-06 DIAGNOSIS — I10 Essential (primary) hypertension: Secondary | ICD-10-CM

## 2014-07-06 MED ORDER — FLUCONAZOLE 150 MG PO TABS: 150.0000 mg | ORAL_TABLET | Freq: Once | ORAL | Status: DC

## 2014-07-06 MED ORDER — ANTIPYRINE-BENZOCAINE 5.4-1.4 % OT SOLN: 3.0000 [drp] | OTIC | Status: DC | PRN

## 2014-07-06 MED ORDER — FLUTICASONE PROPIONATE 50 MCG/ACT NA SUSP: 2.0000 | Freq: Every day | NASAL | Status: DC

## 2014-07-06 NOTE — Discharge Instructions (Signed)
Thank you for coming in today. Call or go to the emergency room if you get worse, have trouble breathing, have chest pains, or palpitations.  If your belly pain worsens, or you have high fever, bad vomiting, blood in your stool or black tarry stool go to the Emergency Room.   Vaginitis Vaginitis is an inflammation of the vagina. It is most often caused by a change in the normal balance of the bacteria and yeast that live in the vagina. This change in balance causes an overgrowth of certain bacteria or yeast, which causes the inflammation. There are different types of vaginitis, but the most common types are:  Bacterial vaginosis.  Yeast infection (candidiasis).  Trichomoniasis vaginitis. This is a sexually transmitted infection (STI).  Viral vaginitis.  Atropic vaginitis.  Allergic vaginitis. CAUSES  The cause depends on the type of vaginitis. Vaginitis can be caused by:  Bacteria (bacterial vaginosis).  Yeast (yeast infection).  A parasite (trichomoniasis vaginitis)  A virus (viral vaginitis).  Low hormone levels (atrophic vaginitis). Low hormone levels can occur during pregnancy, breastfeeding, or after menopause.  Irritants, such as bubble baths, scented tampons, and feminine sprays (allergic vaginitis). Other factors can change the normal balance of the yeast and bacteria that live in the vagina. These include:  Antibiotic medicines.  Poor hygiene.  Diaphragms, vaginal sponges, spermicides, birth control pills, and intrauterine devices (IUD).  Sexual intercourse.  Infection.  Uncontrolled diabetes.  A weakened immune system. SYMPTOMS  Symptoms can vary depending on the cause of the vaginitis. Common symptoms include:  Abnormal vaginal discharge.  The discharge is white, gray, or yellow with bacterial vaginosis.  The discharge is thick, white, and cheesy with a yeast infection.  The discharge is frothy and yellow or greenish with trichomoniasis.  A bad  vaginal odor.  The odor is fishy with bacterial vaginosis.  Vaginal itching, pain, or swelling.  Painful intercourse.  Pain or burning when urinating. Sometimes, there are no symptoms. TREATMENT  Treatment will vary depending on the type of infection.   Bacterial vaginosis and trichomoniasis are often treated with antibiotic creams or pills.  Yeast infections are often treated with antifungal medicines, such as vaginal creams or suppositories.  Viral vaginitis has no cure, but symptoms can be treated with medicines that relieve discomfort. Your sexual partner should be treated as well.  Atrophic vaginitis may be treated with an estrogen cream, pill, suppository, or vaginal ring. If vaginal dryness occurs, lubricants and moisturizing creams may help. You may be told to avoid scented soaps, sprays, or douches.  Allergic vaginitis treatment involves quitting the use of the product that is causing the problem. Vaginal creams can be used to treat the symptoms. HOME CARE INSTRUCTIONS   Take all medicines as directed by your caregiver.  Keep your genital area clean and dry. Avoid soap and only rinse the area with water.  Avoid douching. It can remove the healthy bacteria in the vagina.  Do not use tampons or have sexual intercourse until your vaginitis has been treated. Use sanitary pads while you have vaginitis.  Wipe from front to back. This avoids the spread of bacteria from the rectum to the vagina.  Let air reach your genital area.  Wear cotton underwear to decrease moisture buildup.  Avoid wearing underwear while you sleep until your vaginitis is gone.  Avoid tight pants and underwear or nylons without a cotton panel.  Take off wet clothing (especially bathing suits) as soon as possible.  Use mild, non-scented products.  Avoid using irritants, such as:  Scented feminine sprays.  Fabric softeners.  Scented detergents.  Scented tampons.  Scented soaps or bubble  baths.  Practice safe sex and use condoms. Condoms may prevent the spread of trichomoniasis and viral vaginitis. SEEK MEDICAL CARE IF:   You have abdominal pain.  You have a fever or persistent symptoms for more than 2-3 days.  You have a fever and your symptoms suddenly get worse. Document Released: 07/22/2007 Document Revised: 06/18/2012 Document Reviewed: 03/06/2012 [facility] Patient Information 2015 [place], [place]. This information is not intended to replace advice given to you by your health care provider. Make sure you discuss any questions you have with your health care provider.   Barotitis Media Barotitis media is inflammation of your middle ear. This occurs when the auditory tube (eustachian tube) leading from the back of your nose (nasopharynx) to your eardrum is blocked. This blockage may result from a cold, environmental allergies, or an upper respiratory infection. Unresolved barotitis media may lead to damage or hearing loss (barotrauma), which may become permanent. HOME CARE INSTRUCTIONS   Use medicines as recommended by your health care provider. Over-the-counter medicines will help unblock the canal and can help during times of air travel.  Do not put anything into your ears to clean or unplug them. Eardrops will not be helpful.  Do not swim, dive, or fly until your health care provider says it is all right to do so. If these activities are necessary, chewing gum with frequent, forceful swallowing may help. It is also helpful to hold your nose and gently blow to pop your ears for equalizing pressure changes. This forces air into the eustachian tube.  Only take over-the-counter or prescription medicines for pain, discomfort, or fever as directed by your health care provider.  A decongestant may be helpful in decongesting the middle ear and make pressure equalization easier. SEEK MEDICAL CARE IF:  You experience a serious form of dizziness in which you feel as if the room  is spinning and you feel nauseated (vertigo).  Your symptoms only involve one ear. SEEK IMMEDIATE MEDICAL CARE IF:   You develop a severe headache, dizziness, or severe ear pain.  You have bloody or pus-like drainage from your ears.  You develop a fever.  Your problems do not improve or become worse. MAKE SURE YOU:   Understand these instructions.  Will watch your condition.  Will get help right away if you are not doing well or get worse. Document Released: 09/21/2000 Document Revised: 07/15/2013 Document Reviewed: 04/21/2013 [facility] Patient Information 2015 [place], [place]. This information is not intended to replace advice given to you by your health care provider. Make sure you discuss any questions you have with your health care provider.

## 2014-07-06 NOTE — ED Notes (Signed)
C/o  Left sided ear pain.  States "with moving my head I can feel fluid".  Denies fever.   Also c/o vaginal irritation.  Noticed shortly after changing soaps.  Symptoms present x 1 wk.   No otc treatments tried.

## 2014-07-06 NOTE — ED Provider Notes (Signed)
Toya Smothersameshia Solorzano is a 28 y.o. female who presents to Urgent Care today for left ear pain and pressure present for one week. No injury. No fevers or chills. Patient has a subjective feeling that there is fluid in her ear. She does not note any change in hearing. Additionally she is a one week history of vaginal discharge and itching consistent with previous episodes of yeast vaginitis. She's not tried any medications yet. No chest pains palpitations or shortness of breath.   Past Medical History  Diagnosis Date  . Hypertension    History  Substance Use Topics  . Smoking status: Current Every Day Smoker  . Smokeless tobacco: Not on file  . Alcohol Use: Yes   ROS as above Medications: No current facility-administered medications for this encounter.   Current Outpatient Prescriptions  Medication Sig Dispense Refill  . lisinopril (PRINIVIL,ZESTRIL) 40 MG tablet Take 1 tablet (40 mg total) by mouth daily.  30 tablet  3  . antipyrine-benzocaine (AURALGAN) otic solution Place 3-4 drops into the left ear every 2 (two) hours as needed for ear pain.  10 mL  0  . azithromycin (ZITHROMAX) 250 MG tablet Take 4 tablets (1,000 mg total) by mouth once. Take first 2 tablets together, then 1 every day until finished.  4 tablet  0  . cetirizine (ZYRTEC) 10 MG tablet Take 1 tablet (10 mg total) by mouth daily. One tab daily for allergies  30 tablet  1  . fluconazole (DIFLUCAN) 150 MG tablet Take 1 tablet (150 mg total) by mouth once.  1 tablet  1  . fluticasone (FLONASE) 50 MCG/ACT nasal spray Place 2 sprays into both nostrils daily.  16 g  2  . lisinopril (PRINIVIL,ZESTRIL) 10 MG tablet Take 10 mg by mouth daily.      . metroNIDAZOLE (FLAGYL) 500 MG tablet Take 1 tablet (500 mg total) by mouth 2 (two) times daily.  14 tablet  0  . omeprazole (PRILOSEC) 20 MG capsule Take 1 capsule (20 mg total) by mouth daily.  30 capsule  0  . phenazopyridine (PYRIDIUM) 200 MG tablet Take 1 tablet (200 mg total) by mouth 3  (three) times daily as needed for pain.  15 tablet  0    Exam:  BP 161/109  Pulse 76  Resp 16  SpO2 98%  LMP 06/21/2014 Gen: Well NAD HEENT: EOMI,  MMM left tympanic membrane retracted without erythema. Right is also retracted without erythema. Nontender bilaterally. Lungs: Normal work of breathing. CTABL Heart: RRR no MRG Abd: NABS, Soft. Nondistended, Nontender Exts: Brisk capillary refill, warm and well perfused.  GYN: Normal external genitalia. Vaginal canal with thick white discharge. Normal-appearing cervix. Nontender.  No results found for this or any previous visit (from the past 24 hour(s)). No results found.  Assessment and Plan: 28 y.o. female with  1) Barotis media: Flonase and Auralgan.  2) yeast vaginitis. Cytology pending. Treatment with fluconazole 3) hypertension: Followup with PCP  Discussed warning signs or symptoms. Please see discharge instructions. Patient expresses understanding.     Rodolph BongEvan S Jamayia Croker, MD 07/06/14 2001

## 2014-07-07 LAB — CERVICOVAGINAL ANCILLARY ONLY
Chlamydia: NEGATIVE
Neisseria Gonorrhea: NEGATIVE
Wet Prep (BD Affirm): NEGATIVE
Wet Prep (BD Affirm): NEGATIVE
Wet Prep (BD Affirm): NEGATIVE

## 2014-09-13 ENCOUNTER — Other Ambulatory Visit (HOSPITAL_COMMUNITY)
Admission: RE | Admit: 2014-09-13 | Discharge: 2014-09-13 | Disposition: A | Discharge status: Home / Self Care | Payer: No Typology Code available for payment source | Source: Ambulatory Visit | Attending: Emergency Medicine | Admitting: Emergency Medicine

## 2014-09-13 ENCOUNTER — Emergency Department (INDEPENDENT_AMBULATORY_CARE_PROVIDER_SITE_OTHER)
Admission: EM | Admit: 2014-09-13 | Discharge: 2014-09-13 | Disposition: A | Discharge status: Home / Self Care | Payer: PRIVATE HEALTH INSURANCE | Source: Home / Self Care | Attending: Emergency Medicine | Admitting: Emergency Medicine

## 2014-09-13 ENCOUNTER — Encounter (HOSPITAL_COMMUNITY): Payer: Self-pay | Admitting: Emergency Medicine

## 2014-09-13 DIAGNOSIS — Z113 Encounter for screening for infections with a predominantly sexual mode of transmission: Secondary | ICD-10-CM | POA: Diagnosis not present

## 2014-09-13 DIAGNOSIS — N760 Acute vaginitis: Secondary | ICD-10-CM

## 2014-09-13 DIAGNOSIS — B9689 Other specified bacterial agents as the cause of diseases classified elsewhere: Secondary | ICD-10-CM

## 2014-09-13 DIAGNOSIS — I10 Essential (primary) hypertension: Secondary | ICD-10-CM

## 2014-09-13 DIAGNOSIS — A499 Bacterial infection, unspecified: Secondary | ICD-10-CM

## 2014-09-13 LAB — HIV ANTIBODY (ROUTINE TESTING W REFLEX): HIV 1&2 Ab, 4th Generation: NONREACTIVE

## 2014-09-13 LAB — POCT I-STAT, CHEM 8
BUN: 16 mg/dL (ref 6–23)
Calcium, Ion: 1.18 mmol/L (ref 1.12–1.23)
Chloride: 106 mEq/L (ref 96–112)
Creatinine, Ser: 0.9 mg/dL (ref 0.50–1.10)
Glucose, Bld: 89 mg/dL (ref 70–99)
HCT: 43 % (ref 36.0–46.0)
Hemoglobin: 14.6 g/dL (ref 12.0–15.0)
Potassium: 4 mEq/L (ref 3.7–5.3)
Sodium: 141 mEq/L (ref 137–147)
TCO2: 22 mmol/L (ref 0–100)

## 2014-09-13 MED ORDER — LISINOPRIL 40 MG PO TABS: 40.0000 mg | ORAL_TABLET | Freq: Every day | ORAL | Status: DC

## 2014-09-13 MED ORDER — DICLOFENAC SODIUM 75 MG PO TBEC: DELAYED_RELEASE_TABLET | ORAL | Status: DC

## 2014-09-13 MED ORDER — METRONIDAZOLE 500 MG PO TABS: 500.0000 mg | ORAL_TABLET | Freq: Two times a day (BID) | ORAL | Status: DC

## 2014-09-13 MED ORDER — FLUCONAZOLE 150 MG PO TABS: ORAL_TABLET | ORAL | Status: DC

## 2014-09-13 NOTE — ED Provider Notes (Signed)
Chief Complaint   Hypertension and Vaginal Discharge   History of Present Illness   Kelsey Joyce is a 28 year old female who presents today because of vaginal discharge and refill on blood pressure medication.  She has been on lisinopril 40 mg a day for her blood pressure without a medication side effects. She's been out for about a week. She's had some headache and dizziness. She denies any blurry vision, shortness of breath, chest pain, PND, orthopnea, ankle edema, or strokelike symptoms.  She's also had a one-week history of vaginal discharge, odor, and slight itching. She denies any pelvic pain. No fever, chills, or vomiting. She has felt somewhat nauseated. The patient was seen about a month ago for similar discharge. She was diagnosed as having candida vaginitis at that time since she had it last January. DNA probes were negative for Candida, Gardnerella, and Trichomonas as well as negative for GC and chlamydia. She was treated with a single Diflucan tablet, but cannot tell that she is any better.  Review of Systems   Other than as noted above, the patient denies any of the following symptoms: Systemic:  No fever or chills GI:  No abdominal pain, nausea, vomiting, diarrhea, constipation, melena or hematochezia. GU:  No dysuria, frequency, urgency, hematuria, vaginal discharge, itching, or abnormal vaginal bleeding.  PMFSH   Past medical history, family history, social history, meds, and allergies were reviewed.    Physical Examination    Vital signs:  BP 150/103 mmHg  Pulse 96  Temp(Src) 97.7 F (36.5 C) (Oral)  Resp 18  Ht 5\' 3"  (1.6 m)  Wt 135 lb (61.236 kg)  BMI 23.92 kg/m2  SpO2 100%  LMP 08/18/2014 General:  Alert, oriented and in no distress. Lungs:  Breath sounds clear and equal bilaterally.  No wheezes, rales or rhonchi. Heart:  Regular rhythm.  No gallops or murmers. Abdomen:  Soft, flat and non-distended.  No organomegaly or mass.  No tenderness, guarding  or rebound.  Bowel sounds normally active. Pelvic exam:  Normal external genitalia. There is a scant, malodorous, homogeneous, thin, white discharge. No pain on cervical motion. Uterus is normal in size and shape and nontender. No adnexal tenderness or mass.  DNA probes for gonorrhea, Chlamydia, Trichomonas, Gardnerella, Candida were obtained. Skin:  Clear, warm and dry.  Chaperoned by Blima Ledgeriffany Lytle, RN, who was present throughout the pelvic exam.   Labs   Results for orders placed or performed during the hospital encounter of 09/13/14  I-STAT, chem 8  Result Value Ref Range   Sodium 141 137 - 147 mEq/L   Potassium 4.0 3.7 - 5.3 mEq/L   Chloride 106 96 - 112 mEq/L   BUN 16 6 - 23 mg/dL   Creatinine, Ser 1.610.90 0.50 - 1.10 mg/dL   Glucose, Bld 89 70 - 99 mg/dL   Calcium, Ion 0.961.18 0.451.12 - 1.23 mmol/L   TCO2 22 0 - 100 mmol/L   Hemoglobin 14.6 12.0 - 15.0 g/dL   HCT 40.943.0 81.136.0 - 91.446.0 %    Assessment   The primary encounter diagnosis was Bacterial vaginosis. A diagnosis of Essential hypertension was also pertinent to this visit.       Plan    1.  Meds:  The following meds were prescribed:   Discharge Medication List as of 09/13/2014 12:49 PM    START taking these medications   Details  diclofenac (VOLTAREN) 75 MG EC tablet 1 every 12 hours as needed for headache, Normal  She was also given prescriptions for lisinopril 40 mg, #30, 1 daily with 5 additional refills, metronidazole 500 mg, #14, one twice a day, and Diflucan 150 mg #4, one every other day for 1 week.  2.  Patient Education/Counseling:  The patient was given appropriate handouts, self care instructions, and instructed in symptomatic relief.    3.  Follow up:  The patient was told to follow up here if no better in 3 to 4 days, or sooner if becoming worse in any way, and given some red flag symptoms such as worsening pain, fever, persistent vomiting, or heavy vaginal bleeding which would prompt immediate return.   Follow-up with a primary care physician for her blood pressure as soon as possible.     Reuben Likesavid C Mattheus Rauls, MD 09/13/14 [phone]

## 2014-09-13 NOTE — ED Notes (Signed)
Pt states that she has not had her blood pressure medication in over a week. And that she has had vaginal discharge for over 2 weeks with foul smelling odor.

## 2014-09-13 NOTE — Discharge Instructions (Signed)
Bacterial Vaginosis Bacterial vaginosis is a vaginal infection that occurs when the normal balance of bacteria in the vagina is disrupted. It results from an overgrowth of certain bacteria. This is the most common vaginal infection in women of childbearing age. Treatment is important to prevent complications, especially in pregnant women, as it can cause a premature delivery. CAUSES  Bacterial vaginosis is caused by an increase in harmful bacteria that are normally present in smaller amounts in the vagina. Several different kinds of bacteria can cause bacterial vaginosis. However, the reason that the condition develops is not fully understood. RISK FACTORS Certain activities or behaviors can put you at an increased risk of developing bacterial vaginosis, including:  Having a new sex partner or multiple sex partners.  Douching.  Using an intrauterine device (IUD) for contraception. Women do not get bacterial vaginosis from toilet seats, bedding, swimming pools, or contact with objects around them. SIGNS AND SYMPTOMS  Some women with bacterial vaginosis have no signs or symptoms. Common symptoms include: 1. Grey vaginal discharge. 2. A fishlike odor with discharge, especially after sexual intercourse. 3. Itching or burning of the vagina and vulva. 4. Burning or pain with urination. DIAGNOSIS  Your health care provider will take a medical history and examine the vagina for signs of bacterial vaginosis. A sample of vaginal fluid may be taken. Your health care provider will look at this sample under a microscope to check for bacteria and abnormal cells. A vaginal pH test may also be done.  TREATMENT  Bacterial vaginosis may be treated with antibiotic medicines. These may be given in the form of a pill or a vaginal cream. A second round of antibiotics may be prescribed if the condition comes back after treatment.  HOME CARE INSTRUCTIONS  1. Only take over-the-counter or prescription medicines as  directed by your health care provider. 2. If antibiotic medicine was prescribed, take it as directed. Make sure you finish it even if you start to feel better. 3. Do not have sex until treatment is completed. 4. Tell all sexual partners that you have a vaginal infection. They should see their health care provider and be treated if they have problems, such as a mild rash or itching. 5. Practice safe sex by using condoms and only having one sex partner. SEEK MEDICAL CARE IF:  1. Your symptoms are not improving after 3 days of treatment. 2. You have increased discharge or pain. 3. You have a fever. MAKE SURE YOU:  1. Understand these instructions. 2. Will watch your condition. 3. Will get help right away if you are not doing well or get worse. FOR MORE INFORMATION  Centers for Disease Control and Prevention, Division of STD Prevention: SolutionApps.co.zawww.cdc.gov/std American Sexual Health Association (ASHA): www.ashastd.org  Document Released: 09/24/2005 Document Revised: 07/15/2013 Document Reviewed: 05/06/2013 [facility] Patient Information 2015 [employer] [place]. This information is not intended to replace advice given to you by your health care provider. Make sure you discuss any questions you have with your health care provider.  To restore the normal balance of "good bacteria" in your system.  Take a probiotic once daily.  These can be gotten over the counter at the drug store without a prescription and come under various brand names such as Culturelle, Align, Florastore, and [employer].  The best thing to do is to ask your pharmacist to recommend a good probiotic that is not too expensive.   Blood pressure over the ideal can put you at higher risk for stroke, heart disease, and  kidney failure.  For this reason, it's important to try to get your blood pressure as close as possible to the ideal.  The ideal blood pressure is 120/80.  Blood pressures from 120-139 systolic over 80-89 diastolic are labeled as  "prehypertension."  This means you are at higher risk of developing hypertension in the future.  Blood pressures in this range are not treated with medication, but lifestyle changes are recommended to prevent progression to hypertension.  Blood pressures of 140 and above systolic over 90 and above diastolic are classified as hypertension and are treated with medications.  Lifestyle changes which can benefit both prehypertension and hypertension include the following:   Salt and sodium restriction.  Weight loss.  Regular exercise.  Avoidance of tobacco.  Avoidance of excess alcohol.  The "D.A.S.H" diet.   People with hypertension and prehypertension should limit their salt intake to less than 1500 mg daily.  Reading the nutrition information on the label of many prepared foods can give you an idea of how much sodium you're consuming at each meal.  Remember that the most important number on the nutrition information is the serving size.  It may be smaller than you think.  Try to avoid adding extra salt at the table.  You may add small amounts of salt while cooking.  Remember that salt is an acquired taste and you may get used to a using a whole lot less salt than you are using now.  Using less salt lets the food's natural flavors come through.  You might want to consider using salt substitutes, potassium chloride, pepper, or blends of herbs and spices to enhance the flavor of your food.  Foods that contain the most salt include: processed meats (like ham, bacon, lunch meat, sausage, hot dogs, and breakfast meat), chips, pretzels, salted nuts, soups, salty snacks, canned foods, junk food, fast food, restaurant food, mustard, pickles, pizza, popcorn, soy sauce, and worcestershire sauce--quite a list!  You might ask, "Is there anything I can eat?"  The answer is, "yes."  Fruits and vegetables are usually low in salt.  Fresh is better than frozen which is better than canned.  If you have canned  vegetables, you can cut down on the salt content by rinsing them in tap water 3 times before cooking.     Weight loss is the second thing you can do to lower your blood pressure.  Getting to and maintaining ideal weight will often normalize your blood pressure and allow you to avoid medications, entirely, cut way down on your dosage of medications, or allow to wean off your meds.  (Note, this should only be done under the supervision of your primary care doctor.)  Of course, weight loss takes time and you may need to be on medication in the meantime.  You shoot for a body mass index of 20-25.  When you go to the urgent care or to your primary care doctor, they should calculate your BMI.  If you don't know what it is, ask.  You can calculate your BMI with the following formula:  Weight in pounds x 703/ (height in inches) x (height in inches).  There are many good diets out there: Weight Watchers and the D.A.S.H. Diet are the best, but often, just modifying a few factors can be helpful:  Don't skip meals, don't eat out, and keeping a food diary.  I do not recommend fad diets or diet pills which often raise blood pressure.    Everyone should  get regular exercise, but this is particularly important for people with high blood pressure.  Just about any exercise is good.  The only exercise which may be harmful is lifting extreme heavy weights.  I recommend moderate exercise such as walking for 30 minutes 5 days a week.  Going to the gym for a 50 minute workout 3 times a week is also good.  This amounts to 150 minutes of exercise weekly.   Anyone with high blood pressure should avoid any use of tobacco.  Tobacco use does not elevate blood pressure, but it increases the risk of heart disease and stroke.  If you are interested in quitting, discuss with your doctor how to quit.  If you are not interested in quitting, ask yourself, "What would my life be like in 10 years if I continue to smoke?"  "How will I know when  it is time to quit?"  "How would my life be better if I were to quit."   Excess alcohol intake can raise the blood pressure.  The safe alcohol intake is 2 drinks or less per day for men and 1 drink per day or less for women.   There is a very good diet which I recommend that has been designed for people with blood pressure called the D.A.S.H. Diet (dietary approaches to stop hypertension).  It consists of fruits, vegetables, lean meats, low fat dairy, whole grains, nuts and seeds.  It is very low in salt and sodium.  It has also been found to have other beneficial health effects such as lowering cholesterol and helping lose weight.  It has been developed by the [employer] and can be downloaded from the internet without any cost. Just do a Programmer, multimedia on "D.A.S.H. Diet." or go the NIH website (GolfingPosters.tn).  There are also cookbooks and diet plans that can be gotten from [place] to help you with this diet.    DASH Eating Plan DASH stands for "Dietary Approaches to Stop Hypertension." The DASH eating plan is a healthy eating plan that has been shown to reduce high blood pressure (hypertension). Additional health benefits may include reducing the risk of type 2 diabetes mellitus, heart disease, and stroke. The DASH eating plan may also help with weight loss. WHAT DO I NEED TO KNOW ABOUT THE DASH EATING PLAN? For the DASH eating plan, you will follow these general guidelines:  Choose foods with a percent daily value for sodium of less than 5% (as listed on the food label).  Use salt-free seasonings or herbs instead of table salt or sea salt.  Check with your health care provider or pharmacist before using salt substitutes.  Eat lower-sodium products, often labeled as "lower sodium" or "no salt added."  Eat fresh foods.  Eat more vegetables, fruits, and low-fat dairy products.  Choose whole grains. Look for the word "whole" as the first word in the ingredient list.  Choose  fish and skinless chicken or [place] more often than red meat. Limit fish, poultry, and meat to 6 oz (170 g) each day.  Limit sweets, desserts, sugars, and sugary drinks.  Choose heart-healthy fats.  Limit cheese to 1 oz (28 g) per day.  Eat more home-cooked food and less restaurant, buffet, and fast food.  Limit fried foods.  Cook foods using methods other than frying.  Limit canned vegetables. If you do use them, rinse them well to decrease the sodium.  When eating at a restaurant, ask that your food be  prepared with less salt, or no salt if possible. WHAT FOODS CAN I EAT? Seek help from a dietitian for individual calorie needs. Grains Whole grain or whole wheat bread. Brown rice. Whole grain or whole wheat pasta. Quinoa, bulgur, and whole grain cereals. Low-sodium cereals. Corn or whole wheat flour tortillas. Whole grain cornbread. Whole grain crackers. Low-sodium crackers. Vegetables Fresh or frozen vegetables (raw, steamed, roasted, or grilled). Low-sodium or reduced-sodium tomato and vegetable juices. Low-sodium or reduced-sodium tomato sauce and paste. Low-sodium or reduced-sodium canned vegetables.  Fruits All fresh, canned (in natural juice), or frozen fruits. Meat and Other Protein Products Ground beef (85% or leaner), grass-fed beef, or beef trimmed of fat. Skinless chicken or [place]. Ground chicken or [place]. Pork trimmed of fat. All fish and seafood. Eggs. Dried beans, peas, or lentils. Unsalted nuts and seeds. Unsalted canned beans. Dairy Low-fat dairy products, such as skim or 1% milk, 2% or reduced-fat cheeses, low-fat ricotta or cottage cheese, or plain low-fat yogurt. Low-sodium or reduced-sodium cheeses. Fats and Oils Tub margarines without trans fats. Light or reduced-fat mayonnaise and salad dressings (reduced sodium). Avocado. Safflower, olive, or canola oils. Natural peanut or almond butter. Other Unsalted popcorn and pretzels. The items listed above may not be  a complete list of recommended foods or beverages. Contact your dietitian for more options. WHAT FOODS ARE NOT RECOMMENDED? Grains White bread. White pasta. White rice. Refined cornbread. Bagels and croissants. Crackers that contain trans fat. Vegetables Creamed or fried vegetables. Vegetables in a cheese sauce. Regular canned vegetables. Regular canned tomato sauce and paste. Regular tomato and vegetable juices. Fruits Dried fruits. Canned fruit in light or heavy syrup. Fruit juice. Meat and Other Protein Products Fatty cuts of meat. Ribs, chicken wings, bacon, sausage, bologna, salami, chitterlings, fatback, hot dogs, bratwurst, and packaged luncheon meats. Salted nuts and seeds. Canned beans with salt. Dairy Whole or 2% milk, cream, half-and-half, and cream cheese. Whole-fat or sweetened yogurt. Full-fat cheeses or blue cheese. Nondairy creamers and whipped toppings. Processed cheese, cheese spreads, or cheese curds. Condiments Onion and garlic salt, seasoned salt, table salt, and sea salt. Canned and packaged gravies. Worcestershire sauce. Tartar sauce. Barbecue sauce. Teriyaki sauce. Soy sauce, including reduced sodium. Steak sauce. Fish sauce. Oyster sauce. Cocktail sauce. Horseradish. Ketchup and mustard. Meat flavorings and tenderizers. Bouillon cubes. Hot sauce. Tabasco sauce. Marinades. Taco seasonings. Relishes. Fats and Oils Butter, stick margarine, lard, shortening, ghee, and bacon fat. Coconut, palm kernel, or palm oils. Regular salad dressings. Other Pickles and olives. Salted popcorn and pretzels. The items listed above may not be a complete list of foods and beverages to avoid. Contact your dietitian for more information. WHERE CAN I FIND MORE INFORMATION? National Heart, Lung, and Blood Institute: CablePromo.it Document Released: 09/13/2011 Document Revised: 02/08/2014 Document Reviewed: 07/29/2013 [facility] Patient Information 2015  [place], [place]. This information is not intended to replace advice given to you by your health care provider. Make sure you discuss any questions you have with your health care provider.

## 2014-09-14 LAB — CERVICOVAGINAL ANCILLARY ONLY
Chlamydia: NEGATIVE
Neisseria Gonorrhea: NEGATIVE
Wet Prep (BD Affirm): NEGATIVE
Wet Prep (BD Affirm): NEGATIVE
Wet Prep (BD Affirm): POSITIVE — AB

## 2014-09-14 NOTE — ED Notes (Signed)
GC/Chlamydia neg., Affrim: Candida and Trich neg., Gardnerella pos., HIV non-reactive.  Pt. adequately treated with Flagyl. Vassie MoselleYork, Jaicey Sweaney M 09/14/2014

## 2014-11-02 ENCOUNTER — Encounter (HOSPITAL_COMMUNITY): Payer: Self-pay | Admitting: Emergency Medicine

## 2014-11-02 ENCOUNTER — Other Ambulatory Visit (HOSPITAL_COMMUNITY)
Admission: RE | Admit: 2014-11-02 | Discharge: 2014-11-02 | Disposition: A | Discharge status: Home / Self Care | Payer: No Typology Code available for payment source | Source: Ambulatory Visit | Attending: Emergency Medicine | Admitting: Emergency Medicine

## 2014-11-02 ENCOUNTER — Emergency Department (INDEPENDENT_AMBULATORY_CARE_PROVIDER_SITE_OTHER)
Admission: EM | Admit: 2014-11-02 | Discharge: 2014-11-02 | Disposition: A | Discharge status: Home / Self Care | Payer: PRIVATE HEALTH INSURANCE | Source: Home / Self Care | Attending: Emergency Medicine | Admitting: Emergency Medicine

## 2014-11-02 DIAGNOSIS — Z113 Encounter for screening for infections with a predominantly sexual mode of transmission: Secondary | ICD-10-CM | POA: Diagnosis not present

## 2014-11-02 DIAGNOSIS — R030 Elevated blood-pressure reading, without diagnosis of hypertension: Secondary | ICD-10-CM

## 2014-11-02 DIAGNOSIS — N760 Acute vaginitis: Secondary | ICD-10-CM | POA: Diagnosis present

## 2014-11-02 DIAGNOSIS — Z202 Contact with and (suspected) exposure to infections with a predominantly sexual mode of transmission: Secondary | ICD-10-CM

## 2014-11-02 DIAGNOSIS — IMO0001 Reserved for inherently not codable concepts without codable children: Secondary | ICD-10-CM

## 2014-11-02 LAB — POCT URINALYSIS DIP (DEVICE)
Bilirubin Urine: NEGATIVE
Glucose, UA: NEGATIVE mg/dL
Hgb urine dipstick: NEGATIVE
Ketones, ur: NEGATIVE mg/dL
Leukocytes, UA: NEGATIVE
Nitrite: NEGATIVE
Protein, ur: NEGATIVE mg/dL
Specific Gravity, Urine: 1.01 (ref 1.005–1.030)
Urobilinogen, UA: 0.2 mg/dL (ref 0.0–1.0)
pH: 6.5 (ref 5.0–8.0)

## 2014-11-02 LAB — POCT PREGNANCY, URINE: Preg Test, Ur: NEGATIVE

## 2014-11-02 MED ORDER — AZITHROMYCIN 250 MG PO TABS
ORAL_TABLET | ORAL | Status: AC
  Filled 2014-11-02: qty 4

## 2014-11-02 MED ORDER — CEFTRIAXONE SODIUM 250 MG IJ SOLR
INTRAMUSCULAR | Status: AC
  Filled 2014-11-02: qty 250

## 2014-11-02 MED ORDER — AZITHROMYCIN 250 MG PO TABS
1000.0000 mg | ORAL_TABLET | Freq: Once | ORAL | Status: AC
  Administered 2014-11-02: 1000 mg via ORAL

## 2014-11-02 MED ORDER — LIDOCAINE HCL (PF) 1 % IJ SOLN
INTRAMUSCULAR | Status: AC
  Filled 2014-11-02: qty 5

## 2014-11-02 MED ORDER — CEFTRIAXONE SODIUM 250 MG IJ SOLR
250.0000 mg | Freq: Once | INTRAMUSCULAR | Status: AC
  Administered 2014-11-02: 250 mg via INTRAMUSCULAR

## 2014-11-02 MED ORDER — METRONIDAZOLE 500 MG PO TABS
500.0000 mg | ORAL_TABLET | Freq: Two times a day (BID) | ORAL | Status: DC
Start: 2014-11-02 — End: 2015-02-16

## 2014-11-02 NOTE — Discharge Instructions (Signed)
You have been treated at [facility] for gonorrhea and chlamydia. You have been given a prescription to treat bacterial vaginosis. Please take medication as directed. If remainder of your lab results indicate the need for additional treatment, you will be notified by phone. If symptoms do not improve, [facility] You will also need repeat HIV testing in 2-3 mos at [facility]. No sex for 2 weeks and only if symptoms have resolved. Practice safe sex using condoms.  Lastly, your blood pressure is quite elevated at today's visit and I would recommend follow up with the primary care provider of your choice as soon as possible to have this evaluated and treated.  Bacterial Vaginosis Bacterial vaginosis is a vaginal infection that occurs when the normal balance of bacteria in the vagina is disrupted. It results from an overgrowth of certain bacteria. This is the most common vaginal infection in women of childbearing age. Treatment is important to prevent complications, especially in pregnant women, as it can cause a premature delivery. CAUSES  Bacterial vaginosis is caused by an increase in harmful bacteria that are normally present in smaller amounts in the vagina. Several different kinds of bacteria can cause bacterial vaginosis. However, the reason that the condition develops is not fully understood. RISK FACTORS Certain activities or behaviors can put you at an increased risk of developing bacterial vaginosis, including:  Having a new sex partner or multiple sex partners.  Douching.  Using an intrauterine device (IUD) for contraception. Women do not get bacterial vaginosis from toilet seats, bedding, swimming pools, or contact with objects around them. SIGNS AND SYMPTOMS  Some women with bacterial vaginosis have no signs or symptoms. Common symptoms include:  Grey vaginal discharge.  A fishlike odor with discharge, especially after sexual intercourse.  Itching or burning of the  vagina and vulva.  Burning or pain with urination. DIAGNOSIS  Your health care provider will take a medical history and examine the vagina for signs of bacterial vaginosis. A sample of vaginal fluid may be taken. Your health care provider will look at this sample under a microscope to check for bacteria and abnormal cells. A vaginal pH test may also be done.  TREATMENT  Bacterial vaginosis may be treated with antibiotic medicines. These may be given in the form of a pill or a vaginal cream. A second round of antibiotics may be prescribed if the condition comes back after treatment.  HOME CARE INSTRUCTIONS   Only take over-the-counter or prescription medicines as directed by your health care provider.  If antibiotic medicine was prescribed, take it as directed. Make sure you finish it even if you start to feel better.  Do not have sex until treatment is completed.  Tell all sexual partners that you have a vaginal infection. They should see their health care provider and be treated if they have problems, such as a mild rash or itching.  Practice safe sex by using condoms and only having one sex partner. SEEK MEDICAL CARE IF:   Your symptoms are not improving after 3 days of treatment.  You have increased discharge or pain.  You have a fever. MAKE SURE YOU:   Understand these instructions.  Will watch your condition.  Will get help right away if you are not doing well or get worse. FOR MORE INFORMATION  Centers for Disease Control and Prevention, Division of STD Prevention: SolutionApps.co.za American Sexual Health Association (ASHA): www.ashastd.org  Document Released: 09/24/2005 Document Revised: 07/15/2013 Document Reviewed: 05/06/2013 ExitCare  Patient Information 2015 [place], [place]. This information is not intended to replace advice given to you by your health care provider. Make sure you discuss any questions you have with your health care provider.  Vaginitis Vaginitis is an  inflammation of the vagina. It is most often caused by a change in the normal balance of the bacteria and yeast that live in the vagina. This change in balance causes an overgrowth of certain bacteria or yeast, which causes the inflammation. There are different types of vaginitis, but the most common types are:  Bacterial vaginosis.  Yeast infection (candidiasis).  Trichomoniasis vaginitis. This is a sexually transmitted infection (STI).  Viral vaginitis.  Atropic vaginitis.  Allergic vaginitis. CAUSES  The cause depends on the type of vaginitis. Vaginitis can be caused by:  Bacteria (bacterial vaginosis).  Yeast (yeast infection).  A parasite (trichomoniasis vaginitis)  A virus (viral vaginitis).  Low hormone levels (atrophic vaginitis). Low hormone levels can occur during pregnancy, breastfeeding, or after menopause.  Irritants, such as bubble baths, scented tampons, and feminine sprays (allergic vaginitis). Other factors can change the normal balance of the yeast and bacteria that live in the vagina. These include:  Antibiotic medicines.  Poor hygiene.  Diaphragms, vaginal sponges, spermicides, birth control pills, and intrauterine devices (IUD).  Sexual intercourse.  Infection.  Uncontrolled diabetes.  A weakened immune system. SYMPTOMS  Symptoms can vary depending on the cause of the vaginitis. Common symptoms include:  Abnormal vaginal discharge.  The discharge is white, gray, or yellow with bacterial vaginosis.  The discharge is thick, white, and cheesy with a yeast infection.  The discharge is frothy and yellow or greenish with trichomoniasis.  A bad vaginal odor.  The odor is fishy with bacterial vaginosis.  Vaginal itching, pain, or swelling.  Painful intercourse.  Pain or burning when urinating. Sometimes, there are no symptoms. TREATMENT  Treatment will vary depending on the type of infection.   Bacterial vaginosis and trichomoniasis are  often treated with antibiotic creams or pills.  Yeast infections are often treated with antifungal medicines, such as vaginal creams or suppositories.  Viral vaginitis has no cure, but symptoms can be treated with medicines that relieve discomfort. Your sexual partner should be treated as well.  Atrophic vaginitis may be treated with an estrogen cream, pill, suppository, or vaginal ring. If vaginal dryness occurs, lubricants and moisturizing creams may help. You may be told to avoid scented soaps, sprays, or douches.  Allergic vaginitis treatment involves quitting the use of the product that is causing the problem. Vaginal creams can be used to treat the symptoms. HOME CARE INSTRUCTIONS   Take all medicines as directed by your caregiver.  Keep your genital area clean and dry. Avoid soap and only rinse the area with water.  Avoid douching. It can remove the healthy bacteria in the vagina.  Do not use tampons or have sexual intercourse until your vaginitis has been treated. Use sanitary pads while you have vaginitis.  Wipe from front to back. This avoids the spread of bacteria from the rectum to the vagina.  Let air reach your genital area.  Wear cotton underwear to decrease moisture buildup.  Avoid wearing underwear while you sleep until your vaginitis is gone.  Avoid tight pants and underwear or nylons without a cotton panel.  Take off wet clothing (especially bathing suits) as soon as possible.  Use mild, non-scented products. Avoid using irritants, such as:  Scented feminine sprays.  Fabric softeners.  Scented detergents.  Scented  tampons.  Scented soaps or bubble baths.  Practice safe sex and use condoms. Condoms may prevent the spread of trichomoniasis and viral vaginitis. SEEK MEDICAL CARE IF:   You have abdominal pain.  You have a fever or persistent symptoms for more than 2-3 days.  You have a fever and your symptoms suddenly get worse. Document Released:  07/22/2007 Document Revised: 06/18/2012 Document Reviewed: 03/06/2012 [facility] Patient Information 2015 [place], [place]. This information is not intended to replace advice given to you by your health care provider. Make sure you discuss any questions you have with your health care provider.  Hypertension Hypertension, commonly called high blood pressure, is when the force of blood pumping through your arteries is too strong. Your arteries are the blood vessels that carry blood from your heart throughout your body. A blood pressure reading consists of a higher number over a lower number, such as 110/72. The higher number (systolic) is the pressure inside your arteries when your heart pumps. The lower number (diastolic) is the pressure inside your arteries when your heart relaxes. Ideally you want your blood pressure below 120/80. Hypertension forces your heart to work harder to pump blood. Your arteries may become narrow or stiff. Having hypertension puts you at risk for heart disease, stroke, and other problems.  RISK FACTORS Some risk factors for high blood pressure are controllable. Others are not.  Risk factors you cannot control include:   Race. You may be at higher risk if you are African American.  Age. Risk increases with age.  Gender. Men are at higher risk than women before age 29 years. After age 965, women are at higher risk than men. Risk factors you can control include:  Not getting enough exercise or physical activity.  Being overweight.  Getting too much fat, sugar, calories, or salt in your diet.  Drinking too much alcohol. SIGNS AND SYMPTOMS Hypertension does not usually cause signs or symptoms. Extremely high blood pressure (hypertensive crisis) may cause headache, anxiety, shortness of breath, and nosebleed. DIAGNOSIS  To check if you have hypertension, your health care provider will measure your blood pressure while you are seated, with your arm held at the level of your  heart. It should be measured at least twice using the same arm. Certain conditions can cause a difference in blood pressure between your right and left arms. A blood pressure reading that is higher than normal on one occasion does not mean that you need treatment. If one blood pressure reading is high, ask your health care provider about having it checked again. TREATMENT  Treating high blood pressure includes making lifestyle changes and possibly taking medicine. Living a healthy lifestyle can help lower high blood pressure. You may need to change some of your habits. Lifestyle changes may include:  Following the DASH diet. This diet is high in fruits, vegetables, and whole grains. It is low in salt, red meat, and added sugars.  Getting at least 2 hours of brisk physical activity every week.  Losing weight if necessary.  Not smoking.  Limiting alcoholic beverages.  Learning ways to reduce stress. If lifestyle changes are not enough to get your blood pressure under control, your health care provider may prescribe medicine. You may need to take more than one. Work closely with your health care provider to understand the risks and benefits. HOME CARE INSTRUCTIONS  Have your blood pressure rechecked as directed by your health care provider.   Take medicines only as directed by your health care  provider. Follow the directions carefully. Blood pressure medicines must be taken as prescribed. The medicine does not work as well when you skip doses. Skipping doses also puts you at risk for problems.   Do not smoke.   Monitor your blood pressure at home as directed by your health care provider. SEEK MEDICAL CARE IF:   You think you are having a reaction to medicines taken.  You have recurrent headaches or feel dizzy.  You have swelling in your ankles.  You have trouble with your vision. SEEK IMMEDIATE MEDICAL CARE IF:  You develop a severe headache or confusion.  You have unusual  weakness, numbness, or feel faint.  You have severe chest or abdominal pain.  You vomit repeatedly.  You have trouble breathing. MAKE SURE YOU:   Understand these instructions.  Will watch your condition.  Will get help right away if you are not doing well or get worse. Document Released: 09/24/2005 Document Revised: 02/08/2014 Document Reviewed: 07/17/2013 [facility] Patient Information 2015 [place], [place]. This information is not intended to replace advice given to you by your health care provider. Make sure you discuss any questions you have with your health care provider.

## 2014-11-02 NOTE — ED Notes (Signed)
C/o  Vaginal discharge with a foul odor.  States "my vagina smells like a garbage dump".  Mild irritation of the labia majora.  Concerns for std's.  Also having headache off/on.  Pt hx of hypertension.

## 2014-11-02 NOTE — ED Provider Notes (Signed)
CSN: [id number]     Arrival date & time 11/02/14  1854 History   First MD Initiated Contact with Patient 11/02/14 1945     Chief Complaint  Patient presents with  . Exposure to STD  . Vaginal Itching    with odor   (Consider location/radiation/quality/duration/timing/severity/associated sxs/prior Treatment) HPI Comments: Expresses concerns for possible STI LNMP: 09/14/2014  Patient is a 29 y.o. female presenting with vaginal itching and vaginal discharge. The history is provided by the patient.  Vaginal Itching Pertinent negatives include no abdominal pain.  Vaginal Discharge Quality:  White, yellow and malodorous Onset quality:  Gradual Duration:  1 week Timing:  Constant Progression:  Worsening Chronicity:  New Associated symptoms: vaginal itching   Associated symptoms: no abdominal pain, no dysuria, no fever, no nausea, no rash, no urinary frequency and no vomiting   Risk factors: unprotected sex     Past Medical History  Diagnosis Date  . Hypertension    History reviewed. No pertinent past surgical history. History reviewed. No pertinent family history. History  Substance Use Topics  . Smoking status: Current Every Day Smoker -- 0.20 packs/day    Types: Cigarettes  . Smokeless tobacco: Not on file  . Alcohol Use: Yes   OB History    No data available     Review of Systems  Constitutional: Negative for fever.  Gastrointestinal: Negative.  Negative for nausea, vomiting and abdominal pain.  Genitourinary: Positive for vaginal discharge. Negative for dysuria, urgency, frequency, hematuria, flank pain, decreased urine volume, vaginal bleeding, difficulty urinating, vaginal pain, menstrual problem and pelvic pain.  Musculoskeletal: Negative.   Skin: Negative.   Neurological: Negative for dizziness.    Allergies  Review of patient's allergies indicates no known allergies.  Home Medications   Prior to Admission medications   Medication Sig Start Date End Date  Taking? Authorizing Provider  lisinopril (PRINIVIL,ZESTRIL) 40 MG tablet Take 1 tablet (40 mg total) by mouth daily. 09/13/14  Yes Reuben Likes, MD  antipyrine-benzocaine Lyla Son) otic solution Place 3-4 drops into the left ear every 2 (two) hours as needed for ear pain. 07/06/14   Rodolph Bong, MD  azithromycin (ZITHROMAX) 250 MG tablet Take 4 tablets (1,000 mg total) by mouth once. Take first 2 tablets together, then 1 every day until finished. 01/08/13   Jimmie Molly, MD  cetirizine (ZYRTEC) 10 MG tablet Take 1 tablet (10 mg total) by mouth daily. One tab daily for allergies 02/19/12 02/18/13  Linna Hoff, MD  diclofenac (VOLTAREN) 75 MG EC tablet 1 every 12 hours as needed for headache 09/13/14   Reuben Likes, MD  fluconazole (DIFLUCAN) 150 MG tablet Take every other day for 7 days. 09/13/14   Reuben Likes, MD  fluticasone (FLONASE) 50 MCG/ACT nasal spray Place 2 sprays into both nostrils daily. 07/06/14   Rodolph Bong, MD  metroNIDAZOLE (FLAGYL) 500 MG tablet Take 1 tablet (500 mg total) by mouth 2 (two) times daily. 11/02/14   Mathis Fare Presson, PA  omeprazole (PRILOSEC) 20 MG capsule Take 1 capsule (20 mg total) by mouth daily. 09/21/12   Hayden Rasmussen, NP  phenazopyridine (PYRIDIUM) 200 MG tablet Take 1 tablet (200 mg total) by mouth 3 (three) times daily as needed for pain. 11/13/12   Reuben Likes, MD   BP 156/104 mmHg  Pulse 68  Temp(Src) 98.3 F (36.8 C) (Oral)  Resp 16  SpO2 100%  LMP 09/14/2014 Physical Exam  Constitutional: She is oriented to  person, place, and time. She appears well-developed and well-nourished. No distress.  HENT:  Head: Normocephalic and atraumatic.  Eyes: Conjunctivae are normal. No scleral icterus.  Cardiovascular: Normal rate.   Pulmonary/Chest: Effort normal.  Abdominal: Soft. Bowel sounds are normal. She exhibits no distension. There is no tenderness.  Genitourinary: Uterus normal. Pelvic exam was performed with patient supine. There is no rash,  tenderness, lesion or injury on the right labia. There is no rash, tenderness, lesion or injury on the left labia. Cervix exhibits no motion tenderness, no discharge and no friability. Right adnexum displays no mass, no tenderness and no fullness. Left adnexum displays no mass, no tenderness and no fullness. No erythema, tenderness or bleeding in the vagina. No foreign body around the vagina. No signs of injury around the vagina. Vaginal discharge found.  Moderate thin white discharge  Musculoskeletal: Normal range of motion.  Neurological: She is alert and oriented to person, place, and time.  Skin: Skin is warm and dry.  Psychiatric: She has a normal mood and affect. Her behavior is normal.  Nursing note and vitals reviewed.   ED Course  Procedures (including critical care time) Labs Review Labs Reviewed  RPR  HIV ANTIBODY (ROUTINE TESTING)  POCT URINALYSIS DIP (DEVICE)  POCT PREGNANCY, URINE  CERVICOVAGINAL ANCILLARY ONLY    Imaging Review No results found.   MDM   1. Vaginitis   2. Elevated blood pressure     Patient treated with ceftriaxone 250 mg IM and [facility] 1000[facility]. [facility]. You have been given a prescription to treat bacterial vaginosis. Please take medication as directed. If remainder of your lab results indicate the need for additional treatment, you will be notified by phone. If symptoms do not improve, [facility] You will also need repeat HIV testing in 2-3 mos at [facility]. No sex for 2 weeks and only if symptoms have resolved. Practice safe sex using condoms.  Lastly, your blood pressure is quite elevated at today's visit and I would recommend follow up with the primary care provider of your choice as soon as possible to have this evaluated and treated.    Ria ClockJennifer Lee H Presson, [place]PA 11/02/14 2033

## 2014-11-03 LAB — CERVICOVAGINAL ANCILLARY ONLY
Chlamydia: NEGATIVE
Neisseria Gonorrhea: NEGATIVE
Wet Prep (BD Affirm): NEGATIVE
Wet Prep (BD Affirm): NEGATIVE
Wet Prep (BD Affirm): POSITIVE — AB

## 2014-11-04 NOTE — ED Notes (Addendum)
GC/Chlamydia neg., Affirm: Candida and Trich neg., Gardnerella pos. Pt. adequately treated with Flagyl. Vassie MoselleYork, Elmo Shumard M 11/04/2014 HIVRPR non-reactive. 11/08/2014

## 2014-11-05 LAB — RPR: RPR Ser Ql: NONREACTIVE

## 2014-11-05 LAB — HIV ANTIBODY (ROUTINE TESTING W REFLEX): HIV Screen 4th Generation wRfx: NONREACTIVE

## 2014-11-17 ENCOUNTER — Ambulatory Visit: Payer: PRIVATE HEALTH INSURANCE

## 2015-02-16 ENCOUNTER — Emergency Department (HOSPITAL_COMMUNITY): Payer: No Typology Code available for payment source

## 2015-02-16 ENCOUNTER — Emergency Department (INDEPENDENT_AMBULATORY_CARE_PROVIDER_SITE_OTHER)
Admission: EM | Admit: 2015-02-16 | Discharge: 2015-02-16 | Disposition: A | Discharge status: Nursing Home (Includes ICF) | Payer: No Typology Code available for payment source | Source: Home / Self Care | Attending: Family Medicine | Admitting: Family Medicine

## 2015-02-16 ENCOUNTER — Encounter (HOSPITAL_COMMUNITY): Payer: Self-pay | Admitting: *Deleted

## 2015-02-16 ENCOUNTER — Encounter (HOSPITAL_COMMUNITY): Payer: Self-pay | Admitting: Emergency Medicine

## 2015-02-16 ENCOUNTER — Emergency Department (HOSPITAL_COMMUNITY)
Admission: EM | Admit: 2015-02-16 | Discharge: 2015-02-16 | Disposition: A | Discharge status: Home / Self Care | Payer: No Typology Code available for payment source | Attending: Emergency Medicine | Admitting: Emergency Medicine

## 2015-02-16 DIAGNOSIS — Z79899 Other long term (current) drug therapy: Secondary | ICD-10-CM | POA: Diagnosis not present

## 2015-02-16 DIAGNOSIS — I160 Hypertensive urgency: Secondary | ICD-10-CM

## 2015-02-16 DIAGNOSIS — I10 Essential (primary) hypertension: Secondary | ICD-10-CM

## 2015-02-16 DIAGNOSIS — K2970 Gastritis, unspecified, without bleeding: Secondary | ICD-10-CM | POA: Insufficient documentation

## 2015-02-16 DIAGNOSIS — R197 Diarrhea, unspecified: Secondary | ICD-10-CM | POA: Diagnosis not present

## 2015-02-16 DIAGNOSIS — R110 Nausea: Secondary | ICD-10-CM

## 2015-02-16 DIAGNOSIS — R1030 Lower abdominal pain, unspecified: Secondary | ICD-10-CM

## 2015-02-16 DIAGNOSIS — Z720 Tobacco use: Secondary | ICD-10-CM | POA: Insufficient documentation

## 2015-02-16 LAB — POCT URINALYSIS DIP (DEVICE)
Bilirubin Urine: NEGATIVE
Glucose, UA: NEGATIVE mg/dL
Ketones, ur: NEGATIVE mg/dL
Leukocytes, UA: NEGATIVE
Nitrite: NEGATIVE
Protein, ur: NEGATIVE mg/dL
Specific Gravity, Urine: 1.02 (ref 1.005–1.030)
Urobilinogen, UA: 0.2 mg/dL (ref 0.0–1.0)
pH: 6 (ref 5.0–8.0)

## 2015-02-16 LAB — POCT PREGNANCY, URINE: Preg Test, Ur: NEGATIVE

## 2015-02-16 LAB — COMPREHENSIVE METABOLIC PANEL
ALT: 16 U/L (ref 14–54)
AST: 22 U/L (ref 15–41)
Albumin: 2.8 g/dL — ABNORMAL LOW (ref 3.5–5.0)
Alkaline Phosphatase: 44 U/L (ref 38–126)
Anion gap: 5 (ref 5–15)
BUN: 7 mg/dL (ref 6–20)
CO2: 19 mmol/L — ABNORMAL LOW (ref 22–32)
Calcium: 7.2 mg/dL — ABNORMAL LOW (ref 8.9–10.3)
Chloride: 116 mmol/L — ABNORMAL HIGH (ref 101–111)
Creatinine, Ser: 0.63 mg/dL (ref 0.44–1.00)
GFR calc Af Amer: >60 mL/min (ref >60)
GFR calc non Af Amer: >60 mL/min (ref >60)
Glucose, Bld: 84 mg/dL (ref 70–99)
Potassium: 3.2 mmol/L — ABNORMAL LOW (ref 3.5–5.1)
Sodium: 140 mmol/L (ref 135–145)
Total Bilirubin: 0.3 mg/dL (ref 0.3–1.2)
Total Protein: 5.1 g/dL — ABNORMAL LOW (ref 6.5–8.1)

## 2015-02-16 LAB — CBC WITH DIFFERENTIAL/PLATELET
Basophils Absolute: 0 10*3/uL (ref 0.0–0.1)
Basophils Relative: 0 % (ref 0–1)
Eosinophils Absolute: 0 10*3/uL (ref 0.0–0.7)
Eosinophils Relative: 0 % (ref 0–5)
HCT: 33.5 % — ABNORMAL LOW (ref 36.0–46.0)
Hemoglobin: 11.7 g/dL — ABNORMAL LOW (ref 12.0–15.0)
Lymphocytes Relative: 37 % (ref 12–46)
Lymphs Abs: 3.1 10*3/uL (ref 0.7–4.0)
MCH: 28.8 pg (ref 26.0–34.0)
MCHC: 34.9 g/dL (ref 30.0–36.0)
MCV: 82.5 fL (ref 78.0–100.0)
Monocytes Absolute: 0.7 10*3/uL (ref 0.1–1.0)
Monocytes Relative: 8 % (ref 3–12)
Neutro Abs: 4.6 10*3/uL (ref 1.7–7.7)
Neutrophils Relative %: 55 % (ref 43–77)
Platelets: 180 10*3/uL (ref 150–400)
RBC: 4.06 MIL/uL (ref 3.87–5.11)
RDW: 15.6 % — ABNORMAL HIGH (ref 11.5–15.5)
WBC: 8.5 10*3/uL (ref 4.0–10.5)

## 2015-02-16 LAB — LIPASE, BLOOD: Lipase: 22 U/L (ref 22–51)

## 2015-02-16 MED ORDER — ONDANSETRON HCL 4 MG/2ML IJ SOLN
4.0000 mg | Freq: Once | INTRAMUSCULAR | Status: AC
  Administered 2015-02-16: 4 mg via INTRAVENOUS
  Filled 2015-02-16: qty 2

## 2015-02-16 MED ORDER — SODIUM CHLORIDE 0.9 % IV BOLUS (SEPSIS)
1000.0000 mL | Freq: Once | INTRAVENOUS | Status: AC
  Administered 2015-02-16: 1000 mL via INTRAVENOUS

## 2015-02-16 MED ORDER — ONDANSETRON HCL 4 MG/2ML IJ SOLN
4.0000 mg | Freq: Once | INTRAMUSCULAR | Status: AC
  Administered 2015-02-16: 4 mg via INTRAVENOUS

## 2015-02-16 MED ORDER — ONDANSETRON 4 MG PO TBDP: 4.0000 mg | ORAL_TABLET | Freq: Three times a day (TID) | ORAL | Status: DC | PRN

## 2015-02-16 MED ORDER — LISINOPRIL 10 MG PO TABS
10.0000 mg | ORAL_TABLET | Freq: Once | ORAL | Status: AC
  Administered 2015-02-16: 10 mg via ORAL
  Filled 2015-02-16: qty 1

## 2015-02-16 MED ORDER — MORPHINE SULFATE 4 MG/ML IJ SOLN
4.0000 mg | Freq: Once | INTRAMUSCULAR | Status: AC
Start: 2015-02-16 — End: 2015-02-16
  Administered 2015-02-16: 4 mg via INTRAVENOUS
  Filled 2015-02-16: qty 1

## 2015-02-16 MED ORDER — IOHEXOL 300 MG/ML  SOLN
80.0000 mL | Freq: Once | INTRAMUSCULAR | Status: AC | PRN
  Administered 2015-02-16: 80 mL via INTRAVENOUS

## 2015-02-16 MED ORDER — IOHEXOL 300 MG/ML  SOLN
25.0000 mL | INTRAMUSCULAR | Status: AC
  Administered 2015-02-16: 25 mL via ORAL

## 2015-02-16 MED ORDER — OXYCODONE-ACETAMINOPHEN 5-325 MG PO TABS: 2.0000 | ORAL_TABLET | ORAL | Status: DC | PRN

## 2015-02-16 MED ORDER — ONDANSETRON HCL 4 MG/2ML IJ SOLN
INTRAMUSCULAR | Status: AC
  Filled 2015-02-16: qty 2

## 2015-02-16 MED ORDER — PANTOPRAZOLE SODIUM 40 MG IV SOLR
40.0000 mg | INTRAVENOUS | Status: AC
  Administered 2015-02-16: 40 mg via INTRAVENOUS
  Filled 2015-02-16: qty 40

## 2015-02-16 MED ORDER — LISINOPRIL 40 MG PO TABS: 40.0000 mg | ORAL_TABLET | Freq: Every day | ORAL | Status: DC

## 2015-02-16 MED ORDER — POTASSIUM CHLORIDE CRYS ER 20 MEQ PO TBCR
40.0000 meq | EXTENDED_RELEASE_TABLET | Freq: Once | ORAL | Status: AC
  Administered 2015-02-16: 40 meq via ORAL
  Filled 2015-02-16: qty 2

## 2015-02-16 MED ORDER — ONDANSETRON HCL 4 MG/2ML IJ SOLN: 4.0000 mg | Freq: Once | INTRAMUSCULAR | Status: DC

## 2015-02-16 MED ORDER — PANTOPRAZOLE SODIUM 20 MG PO TBEC: 20.0000 mg | DELAYED_RELEASE_TABLET | Freq: Every day | ORAL | Status: DC

## 2015-02-16 NOTE — Discharge Instructions (Signed)
Take protonix daily for stomach acid control. Take zofran for nausea. Take Percocet for pain. Refer to attached documents for more information. Follow up with the recommended GI doctor.

## 2015-02-16 NOTE — ED Provider Notes (Addendum)
CSN: [id number]     Arrival date & time 02/16/15  [id number] History   First MD Initiated Contact with Patient 02/16/15 [phone]     Chief Complaint  Patient presents with  . Abdominal Pain   (Consider location/radiation/quality/duration/timing/severity/associated sxs/prior Treatment) HPI Lower abd pain started at 06:00 today. Constant. Improves w/ rest. Getting worse. BC powder w/o improvement. Diarrhea x5 today. Typically with regular daily bowel movements. Watery and non-bloody. Surgery with nausea. Patient endorses marijuana use last night. Denies fevers, vaginal discharge or irritation, dysuria, frequency, rash, chest pain, shortness of breath, palpitations, headache, LOC. Patient reports being in her usual state of health until acute onset of symptoms this morning. This issue has occurred several years ago w/o a formal Dx.   Patient states that she ran out of her lisinopril several days ago and has not tried to get it refilled. States that she has refills at the pharmacy but is just not picked them up. Denies chest pain, palpitations, shortness of breath   Past Medical History  Diagnosis Date  . Hypertension    History reviewed. No pertinent past surgical history. Family History  Problem Relation Age of Onset  . Cancer Other     cervical   History  Substance Use Topics  . Smoking status: Current Every Day Smoker -- 0.20 packs/day    Types: Cigarettes  . Smokeless tobacco: Not on file  . Alcohol Use: Yes   OB History    No data available     Review of Systems Per HPI with all other pertinent systems negative.   Allergies  Review of patient's allergies indicates no known allergies.  Home Medications   Prior to Admission medications   Medication Sig Start Date End Date Taking? Authorizing Provider  cetirizine (ZYRTEC) 10 MG tablet Take 1 tablet (10 mg total) by mouth daily. One tab daily for allergies 02/19/12 02/18/13  Linna HoffJames D Kindl, MD  lisinopril (PRINIVIL,ZESTRIL) 40 MG  tablet Take 1 tablet (40 mg total) by mouth daily. 09/13/14   Reuben Likesavid C Keller, MD   BP 176/118 mmHg  Pulse 78  Temp(Src) 97.4 F (36.3 C) (Oral)  Resp 16  SpO2 99%  LMP 01/21/2015 (Approximate) Physical Exam Physical Exam  Constitutional: oriented to person, place, and time. appears well-developed and well-nourished. No distress.  HENT:  Head: Normocephalic and atraumatic.  Eyes: EOMI. PERRL.  Neck: Normal range of motion.  Cardiovascular: RRR, no m/r/g, 2+ distal pulses,  Pulmonary/Chest: Effort normal and breath sounds normal. No respiratory distress.  Abdominal: Normoactive bowel sounds, nondistended, intermittently tender to deep palpation different regions of the lower abdomen with greatest area of tenderness in the suprapubic region. Nontender at McBurney's point. No rebound or guarding, negative Murphy sign. Negative psoas sign..  Musculoskeletal: Normal range of motion. Non ttp, no effusion.  Neurological: alert and oriented to person, place, and time.  Skin: Skin is warm. No rash noted. non diaphoretic.  Psychiatric: normal mood and affect. behavior is normal. Judgment and thought content normal.   ED Course  Procedures (including critical care time) Labs Review Labs Reviewed  POCT URINALYSIS DIP (DEVICE) - Abnormal; Notable for the following:    Hgb urine dipstick TRACE (*)    All other components within normal limits  POCT PREGNANCY, URINE    Imaging Review No results found.   MDM   1. Lower abdominal pain   2. Nausea   3. Diarrhea   4. Hypertensive urgency    Urinalysis unremarkable. Urine pregnancy negative.  Patient  writhing on exam room table in pain and tearful. Etiology abdominal pain is not immediately clear but given profuse diarrhea per patient history and current presentation there is concern for colitis or other acute abdominal pathology. Cannot exclude psychosomatic etiology given multiple similar episodes in the distant past per mother with  negative workups. Of note use of marijuana may also be contributing. Recommending patient received further workup in the emergency room. Patient started on a 1 L normal saline bolus and given 4 mg of Zofran IV prior to transfer to emergency room.  Patient's marked elevation in blood pressure likely secondary to not taking her lisinopril as prescribed, and being in extreme abdominal pain. Patient with further refills at the pharmacy and saphenous to get these refilled. No medications were given for her acute HTN as her blood pressure will likely improve significantly once her pain is improved. Patient to continue her outpatient medications were to receive medications per the ED stay discussion if blood pressure remains elevated.    Ozella Rocksavid J Merrell, MD 02/16/15 1020  Ozella Rocksavid J Merrell, MD 02/16/15 1021

## 2015-02-16 NOTE — ED Notes (Signed)
C/o constant "severe" abd pain onset 0600 this am Reports x5 loose stools Denies f/n/v, urinary sx Alert, panting and crying; no signs of acute distress.

## 2015-02-16 NOTE — ED Provider Notes (Signed)
CSN: [id number]     Arrival date & time 02/16/15  1044 History   First MD Initiated Contact with Patient 02/16/15 1102     Chief Complaint  Patient presents with  . Abdominal Pain     (Consider location/radiation/quality/duration/timing/severity/associated sxs/prior Treatment) HPI   Kelsey Joyce is a 29 y/o female complaining of abdominal pain.  She was sent via ambulance from urgent care.  The pain began at 6:00 this morning and is described as a burning sensation in her lower abdomen.  She says she woke up with a sudden sensation that she needed to have a bowel movement.  She has had 5 episodes of non-bloody diarrhea this morning.  Her mother states that she was in so much pain this morning she couldn't walk or speak.  The pain has been constant since it began this morning.  This is not the first time she has had this pain, but she states that it is the worse episode she has had.  She has previously seen a GI specialist several years back at [facility] for similar but does not remember what the results were.  She has had nausea today without vomiting.  She took Goody's powder prior to going to urgent care without any improvement.  She recieved Zofran prior to transfer from urgent care to the ED with significant relief.    She denies fever, chills, urinary symptoms, vaginal discharge, chest pain, shortness of breath, dizziness, headache, melena or hematochezia, or any abdominal surgeries in the past.  She still has her appendix and gall bladder.    Past Medical History  Diagnosis Date  . Hypertension    History reviewed. No pertinent past surgical history. Family History  Problem Relation Age of Onset  . Cancer Other     cervical   History  Substance Use Topics  . Smoking status: Current Every Day Smoker -- 0.20 packs/day    Types: Cigarettes  . Smokeless tobacco: Not on file  . Alcohol Use: Yes   OB History    No data available     Review of Systems  Constitutional: Negative for  fever and chills.  Respiratory: Negative for chest tightness, shortness of breath and wheezing.   Cardiovascular: Negative for chest pain and palpitations.  Gastrointestinal: Positive for nausea, abdominal pain and diarrhea. Negative for vomiting.       5 episodes of diarrhea since 6:00 this morning  Genitourinary: Negative for dysuria, frequency, flank pain, vaginal discharge and difficulty urinating.  Musculoskeletal: Negative for back pain.  Neurological: Negative for dizziness and headaches.  All other systems reviewed and are negative.     Allergies  Review of patient's allergies indicates no known allergies.  Home Medications   Prior to Admission medications   Medication Sig Start Date End Date Taking? Authorizing Provider  lisinopril (PRINIVIL,ZESTRIL) 40 MG tablet Take 1 tablet (40 mg total) by mouth daily. 09/13/14  Yes Reuben Likesavid C Keller, MD  cetirizine (ZYRTEC) 10 MG tablet Take 1 tablet (10 mg total) by mouth daily. One tab daily for allergies 02/19/12 02/18/13  Linna HoffJames D Kindl, MD   BP 162/106 mmHg  Pulse 64  Temp(Src) 97.7 F (36.5 C) (Oral)  Resp 17  SpO2 100%  LMP 01/21/2015 (Approximate) Physical Exam  Constitutional: She appears well-developed and well-nourished. No distress.  HENT:  Head: Normocephalic and atraumatic.  Eyes: Conjunctivae are normal.  Cardiovascular: Normal rate, regular rhythm and normal heart sounds.  Exam reveals no gallop and no friction rub.   No  murmur heard. Pulmonary/Chest: Effort normal and breath sounds normal. No respiratory distress. She has no wheezes. She has no rales. She exhibits no tenderness.  Abdominal: Soft. Bowel sounds are normal. She exhibits no distension. There is tenderness. There is no rebound and no guarding.  Generalized abdominal tenderness with no focal tenderness  Musculoskeletal: Normal range of motion.  Neurological: She is alert. Coordination normal.  Speech is goal-oriented. Moves limbs without ataxia.   Skin:  Skin is warm and dry.  Psychiatric: She has a normal mood and affect. Her behavior is normal.  Nursing note and vitals reviewed.   ED Course  Procedures (including critical care time) Labs Review Labs Reviewed  CBC WITH DIFFERENTIAL/PLATELET - Abnormal; Notable for the following:    Hemoglobin 11.7 (*)    HCT 33.5 (*)    RDW 15.6 (*)    All other components within normal limits  COMPREHENSIVE METABOLIC PANEL - Abnormal; Notable for the following:    Potassium 3.2 (*)    Chloride 116 (*)    CO2 19 (*)    Calcium 7.2 (*)    Total Protein 5.1 (*)    Albumin 2.8 (*)    All other components within normal limits  LIPASE, BLOOD    Imaging Review Ct Abdomen Pelvis W Contrast  02/16/2015   CLINICAL DATA:  Generalized abdominal pain beginning at 5 a.m., diarrhea x5 days  EXAM: CT ABDOMEN AND PELVIS WITH CONTRAST  TECHNIQUE: Multidetector CT imaging of the abdomen and pelvis was performed using the standard protocol following bolus administration of intravenous contrast.  CONTRAST:  80mL OMNIPAQUE IOHEXOL 300 MG/ML  SOLN  COMPARISON:  07/02/2008  FINDINGS: Lower chest:  Lung bases are clear.  Hepatobiliary: Liver is within normal limits. No suspicious/enhancing hepatic lesions.  Gallbladder is within normal limits. No intrahepatic or extrahepatic ductal dilatation.  Pancreas: Within normal limits.  Spleen: Within normal limits.  Adrenals/Urinary Tract: Adrenal glands are within normal limits.  Kidneys are within normal limits.  No hydronephrosis.  Bladder is thick-walled.  Stomach/Bowel: Stomach is notable for mild narrowing/subcutaneous edema along the distal greater curvature (Series 2/image 19).  No evidence of bowel obstruction.  Normal appendix.  Vascular/Lymphatic: No evidence of abdominal aortic aneurysm.  No suspicious abdominopelvic lymphadenopathy.  Reproductive: Uterus is unremarkable.  Right ovary is within normal limits.  Dilated tubular structure in the left adnexa (series 2/image 60),  favored to reflect a hydrosalpinx.  Other: Trace pelvic ascites (series 2/ image 63).  Musculoskeletal: Visualized osseous structures are within normal limits.  IMPRESSION: No evidence of bowel obstruction.  Normal appendix.  Mildly thick-walled bladder, correlate for cystitis.  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  Mild subcutaneous edema along the distal greater curvature of the stomach. Correlate for gastritis or gastric ulcer.   Electronically Signed   By: Charline BillsSriyesh  Krishnan M.D.   On: 02/16/2015 14:28     EKG Interpretation None      MDM   Final diagnoses:  Gastritis    11:54 AM Labs pending. Vitals stable and patient afebrile.   3:35 PM CT shows gastritis possibly from a gastric ulcer. Patient feeling better. Labs unremarkable. Patient will have GI follow up and protonix Rx. No further evaluation needed at this time.   Emilia BeckKaitlyn Verner Mccrone, PA-C 02/16/15 1535  Jerelyn ScottMartha Linker, MD 02/16/15 [phone]

## 2015-02-16 NOTE — ED Notes (Signed)
Patient ambulated to restroom without difficulty.

## 2015-02-16 NOTE — ED Notes (Signed)
Pt presents via [facility] from [facility] c/o generalized abdominal pain beginning this AM at 0500.  Pt last ate last night at dinner time.  Pt also reports diarrhea x 5, no recent abx.  Per report pt had similar episode 1 year ago, never diagnosed.  Also pt reports running out of her lisinopril a few days ago.  Pt a x 4, tearful on arrival.

## 2015-03-17 ENCOUNTER — Encounter (HOSPITAL_COMMUNITY): Payer: Self-pay | Admitting: Emergency Medicine

## 2015-03-17 ENCOUNTER — Emergency Department (HOSPITAL_COMMUNITY)
Admission: EM | Admit: 2015-03-17 | Discharge: 2015-03-17 | Disposition: A | Discharge status: Home / Self Care | Payer: No Typology Code available for payment source | Attending: Emergency Medicine | Admitting: Emergency Medicine

## 2015-03-17 DIAGNOSIS — R519 Headache, unspecified: Secondary | ICD-10-CM

## 2015-03-17 DIAGNOSIS — Z79899 Other long term (current) drug therapy: Secondary | ICD-10-CM | POA: Diagnosis not present

## 2015-03-17 DIAGNOSIS — Z720 Tobacco use: Secondary | ICD-10-CM | POA: Diagnosis not present

## 2015-03-17 DIAGNOSIS — R030 Elevated blood-pressure reading, without diagnosis of hypertension: Secondary | ICD-10-CM | POA: Insufficient documentation

## 2015-03-17 DIAGNOSIS — I10 Essential (primary) hypertension: Secondary | ICD-10-CM | POA: Insufficient documentation

## 2015-03-17 DIAGNOSIS — IMO0001 Reserved for inherently not codable concepts without codable children: Secondary | ICD-10-CM

## 2015-03-17 DIAGNOSIS — R51 Headache: Secondary | ICD-10-CM | POA: Diagnosis present

## 2015-03-17 MED ORDER — KETOROLAC TROMETHAMINE 30 MG/ML IJ SOLN
30.0000 mg | Freq: Once | INTRAMUSCULAR | Status: AC
  Administered 2015-03-17: 30 mg via INTRAVENOUS
  Filled 2015-03-17: qty 1

## 2015-03-17 MED ORDER — METOCLOPRAMIDE HCL 5 MG/ML IJ SOLN
10.0000 mg | Freq: Once | INTRAMUSCULAR | Status: AC
  Administered 2015-03-17: 10 mg via INTRAVENOUS
  Filled 2015-03-17: qty 2

## 2015-03-17 MED ORDER — DIPHENHYDRAMINE HCL 50 MG/ML IJ SOLN
25.0000 mg | Freq: Once | INTRAMUSCULAR | Status: AC
  Administered 2015-03-17: 25 mg via INTRAVENOUS
  Filled 2015-03-17: qty 1

## 2015-03-17 NOTE — ED Notes (Signed)
Pt states she takes lisinipril for BP. She has not missed a dose, but feels like her BP is elevated. Pt complaining of HA.

## 2015-03-17 NOTE — ED Provider Notes (Signed)
CSN: [id number]     Arrival date & time 03/17/15  1057 History   First MD Initiated Contact with Patient 03/17/15 1126     Chief Complaint  Patient presents with  . Hypertension     (Consider location/radiation/quality/duration/timing/severity/associated sxs/prior Treatment) HPI Comments: Patient with a history of HTN and Migraine Headache presents today with a chief complaint of headache.  She states that the headache has been present since yesterday.  Headache gradual in onset.  She reports that the headache feels similar to headaches that she has had in the past when her blood pressure has been elevated.  She attempted to take an Excedrin Migraine for the headache, but does not feel that it helped.  She takes 40 mg Lisinopril for her HTN and reports that she has been compliant with her medications.  She denies vision changes, fever, chills, nausea, vomiting, neck pain/stiffness, dizziness, lightheadedness, or syncope.  She also denies chest pain or SOB.  The history is provided by the patient.    Past Medical History  Diagnosis Date  . Hypertension    History reviewed. No pertinent past surgical history. Family History  Problem Relation Age of Onset  . Cancer Other     cervical   History  Substance Use Topics  . Smoking status: Current Some Day Smoker -- 0.20 packs/day    Types: Cigarettes  . Smokeless tobacco: Not on file  . Alcohol Use: Yes   OB History    No data available     Review of Systems  All other systems reviewed and are negative.     Allergies  Review of patient's allergies indicates no known allergies.  Home Medications   Prior to Admission medications   Medication Sig Start Date End Date Taking? Authorizing Provider  cetirizine (ZYRTEC) 10 MG tablet Take 1 tablet (10 mg total) by mouth daily. One tab daily for allergies 02/19/12 02/18/13  Linna Hoff, MD  lisinopril (PRINIVIL,ZESTRIL) 40 MG tablet Take 1 tablet (40 mg total) by mouth daily. 02/16/15    Kaitlyn Szekalski, PA-C  ondansetron (ZOFRAN ODT) 4 MG disintegrating tablet Take 1 tablet (4 mg total) by mouth every 8 (eight) hours as needed for nausea or vomiting. 02/16/15   Emilia Beck, PA-C  oxyCODONE-acetaminophen (PERCOCET/ROXICET) 5-325 MG per tablet Take 2 tablets by mouth every 4 (four) hours as needed for severe pain. 02/16/15   Kaitlyn Szekalski, PA-C  pantoprazole (PROTONIX) 20 MG tablet Take 1 tablet (20 mg total) by mouth daily. 02/16/15   Kaitlyn Szekalski, PA-C   BP 145/78 mmHg  Pulse 76  Temp(Src) 98 F (36.7 C) (Oral)  Resp 19  Ht 5\' 3"  (1.6 m)  Wt 125 lb (56.7 kg)  BMI 22.15 kg/m2  SpO2 97%  LMP 03/04/2015 Physical Exam  Constitutional: She appears well-developed and well-nourished.  HENT:  Head: Normocephalic and atraumatic.  Mouth/Throat: Oropharynx is clear and moist.  Eyes: EOM are normal. Pupils are equal, round, and reactive to light.  Neck: Normal range of motion. Neck supple.  Cardiovascular: Normal rate, regular rhythm and normal heart sounds.   Pulmonary/Chest: Effort normal and breath sounds normal.  Musculoskeletal: Normal range of motion.  Neurological: She is alert. She has normal strength. No cranial nerve deficit or sensory deficit. Coordination and gait normal.  Normal gait, no ataxia Normal rapid alternating movements Normal finger to nose testing  Skin: Skin is warm and dry.  Psychiatric: She has a normal mood and affect.  Nursing note and vitals reviewed.  ED Course  Procedures (including critical care time) Labs Review Labs Reviewed - No data to display  Imaging Review No results found.   EKG Interpretation None     Today's Vitals   03/17/15 1200 03/17/15 1215 03/17/15 1221 03/17/15 1302  BP: 143/83 145/78    Pulse: 62 76    Temp:      TempSrc:      Resp: 16 19    Height:      Weight:      SpO2: 98% 97%    PainSc:   10-Worst pain ever 7    MDM   Final diagnoses:  None   Patient presents today with a  headache.  Blood pressure only mildly elevated.  Doubt hypertensive urgency or emergency.  Blood pressure came down to 145/78 without medications.  She has a normal neurological exam.  Headache improved with migraine cocktail.  Pt  Presentation is like pts typical HA and non concerning for [facility], ICH, Meningitis, or temporal arteritis. Pt is afebrile with no focal neuro deficits, nuchal rigidity, or change in vision. Pt stable for discharge.  Return precautions given.     Santiago Glad, PA-C 03/18/15 1626  Arby Barrette, MD 03/19/15 [phone]

## 2015-04-20 ENCOUNTER — Ambulatory Visit: Payer: No Typology Code available for payment source | Admitting: Gastroenterology

## 2015-04-25 ENCOUNTER — Ambulatory Visit: Payer: No Typology Code available for payment source | Attending: Internal Medicine | Admitting: Internal Medicine

## 2015-04-25 ENCOUNTER — Encounter: Payer: Self-pay | Admitting: Internal Medicine

## 2015-04-25 ENCOUNTER — Ambulatory Visit: Payer: No Typology Code available for payment source | Admitting: Internal Medicine

## 2015-04-25 VITALS — BP 140/80 | HR 79 | Temp 98.0°F | Resp 16 | Wt 125.6 lb

## 2015-04-25 DIAGNOSIS — K219 Gastro-esophageal reflux disease without esophagitis: Secondary | ICD-10-CM | POA: Diagnosis not present

## 2015-04-25 DIAGNOSIS — F17200 Nicotine dependence, unspecified, uncomplicated: Secondary | ICD-10-CM

## 2015-04-25 DIAGNOSIS — Z720 Tobacco use: Secondary | ICD-10-CM

## 2015-04-25 DIAGNOSIS — I10 Essential (primary) hypertension: Secondary | ICD-10-CM | POA: Insufficient documentation

## 2015-04-25 DIAGNOSIS — F17210 Nicotine dependence, cigarettes, uncomplicated: Secondary | ICD-10-CM | POA: Insufficient documentation

## 2015-04-25 DIAGNOSIS — Z833 Family history of diabetes mellitus: Secondary | ICD-10-CM | POA: Insufficient documentation

## 2015-04-25 DIAGNOSIS — Z130 Encounter for screening for diseases of the blood and blood-forming organs and certain disorders involving the immune mechanism: Secondary | ICD-10-CM | POA: Insufficient documentation

## 2015-04-25 DIAGNOSIS — Z79899 Other long term (current) drug therapy: Secondary | ICD-10-CM | POA: Insufficient documentation

## 2015-04-25 DIAGNOSIS — Z139 Encounter for screening, unspecified: Secondary | ICD-10-CM

## 2015-04-25 LAB — COMPLETE METABOLIC PANEL WITH GFR
ALT: 14 U/L (ref 0–35)
AST: 17 U/L (ref 0–37)
Albumin: 3.8 g/dL (ref 3.5–5.2)
Alkaline Phosphatase: 50 U/L (ref 39–117)
BUN: 15 mg/dL (ref 6–23)
CO2: 26 mEq/L (ref 19–32)
Calcium: 9.4 mg/dL (ref 8.4–10.5)
Chloride: 106 mEq/L (ref 96–112)
Creat: 0.94 mg/dL (ref 0.50–1.10)
GFR, Est African American: >89 mL/min
GFR, Est Non African American: 82 mL/min
Glucose, Bld: 114 mg/dL — ABNORMAL HIGH (ref 70–99)
Potassium: 4.3 mEq/L (ref 3.5–5.3)
Sodium: 140 mEq/L (ref 135–145)
Total Bilirubin: 0.3 mg/dL (ref 0.2–1.2)
Total Protein: 7.1 g/dL (ref 6.0–8.3)

## 2015-04-25 LAB — HEMOGLOBIN A1C
Hgb A1c MFr Bld: 5.8 % — ABNORMAL HIGH (ref <5.7)
Mean Plasma Glucose: 120 mg/dL — ABNORMAL HIGH (ref <117)

## 2015-04-25 MED ORDER — PANTOPRAZOLE SODIUM 20 MG PO TBEC: 20.0000 mg | DELAYED_RELEASE_TABLET | Freq: Every day | ORAL | Status: DC

## 2015-04-25 MED ORDER — LISINOPRIL-HYDROCHLOROTHIAZIDE 20-25 MG PO TABS
1.0000 | ORAL_TABLET | Freq: Every day | ORAL | Status: DC
Start: 2015-04-25 — End: 2015-07-29

## 2015-04-25 NOTE — Patient Instructions (Signed)
DASH Eating Plan °DASH stands for "Dietary Approaches to Stop Hypertension." The DASH eating plan is a healthy eating plan that has been shown to reduce high blood pressure (hypertension). Additional health benefits may include reducing the risk of type 2 diabetes mellitus, heart disease, and stroke. The DASH eating plan may also help with weight loss. °WHAT DO I NEED TO KNOW ABOUT THE DASH EATING PLAN? °For the DASH eating plan, you will follow these general guidelines: °· Choose foods with a percent daily value for sodium of less than 5% (as listed on the food label). °· Use salt-free seasonings or herbs instead of table salt or sea salt. °· Check with your health care provider or pharmacist before using salt substitutes. °· Eat lower-sodium products, often labeled as "lower sodium" or "no salt added." °· Eat fresh foods. °· Eat more vegetables, fruits, and low-fat dairy products. °· Choose whole grains. Look for the word "whole" as the first word in the ingredient list. °· Choose fish and skinless chicken or turkey more often than red meat. Limit fish, poultry, and meat to 6 oz (170 g) each day. °· Limit sweets, desserts, sugars, and sugary drinks. °· Choose heart-healthy fats. °· Limit cheese to 1 oz (28 g) per day. °· Eat more home-cooked food and less restaurant, buffet, and fast food. °· Limit fried foods. °· Cook foods using methods other than frying. °· Limit canned vegetables. If you do use them, rinse them well to decrease the sodium. °· When eating at a restaurant, ask that your food be prepared with less salt, or no salt if possible. °WHAT FOODS CAN I EAT? °Seek help from a dietitian for individual calorie needs. °Grains °Whole grain or whole wheat bread. Brown rice. Whole grain or whole wheat pasta. Quinoa, bulgur, and whole grain cereals. Low-sodium cereals. Corn or whole wheat flour tortillas. Whole grain cornbread. Whole grain crackers. Low-sodium crackers. °Vegetables °Fresh or frozen vegetables  (raw, steamed, roasted, or grilled). Low-sodium or reduced-sodium tomato and vegetable juices. Low-sodium or reduced-sodium tomato sauce and paste. Low-sodium or reduced-sodium canned vegetables.  °Fruits °All fresh, canned (in natural juice), or frozen fruits. °Meat and Other Protein Products °Ground beef (85% or leaner), grass-fed beef, or beef trimmed of fat. Skinless chicken or turkey. Ground chicken or turkey. Pork trimmed of fat. All fish and seafood. Eggs. Dried beans, peas, or lentils. Unsalted nuts and seeds. Unsalted canned beans. °Dairy °Low-fat dairy products, such as skim or 1% milk, 2% or reduced-fat cheeses, low-fat ricotta or cottage cheese, or plain low-fat yogurt. Low-sodium or reduced-sodium cheeses. °Fats and Oils °Tub margarines without trans fats. Light or reduced-fat mayonnaise and salad dressings (reduced sodium). Avocado. Safflower, olive, or canola oils. Natural peanut or almond butter. °Other °Unsalted popcorn and pretzels. °The items listed above may not be a complete list of recommended foods or beverages. Contact your dietitian for more options. °WHAT FOODS ARE NOT RECOMMENDED? °Grains °White bread. White pasta. White rice. Refined cornbread. Bagels and croissants. Crackers that contain trans fat. °Vegetables °Creamed or fried vegetables. Vegetables in a cheese sauce. Regular canned vegetables. Regular canned tomato sauce and paste. Regular tomato and vegetable juices. °Fruits °Dried fruits. Canned fruit in light or heavy syrup. Fruit juice. °Meat and Other Protein Products °Fatty cuts of meat. Ribs, chicken wings, bacon, sausage, bologna, salami, chitterlings, fatback, hot dogs, bratwurst, and packaged luncheon meats. Salted nuts and seeds. Canned beans with salt. °Dairy °Whole or 2% milk, cream, half-and-half, and cream cheese. Whole-fat or sweetened yogurt. Full-fat   cheeses or blue cheese. Nondairy creamers and whipped toppings. Processed cheese, cheese spreads, or cheese  curds. °Condiments °Onion and garlic salt, seasoned salt, table salt, and sea salt. Canned and packaged gravies. Worcestershire sauce. Tartar sauce. Barbecue sauce. Teriyaki sauce. Soy sauce, including reduced sodium. Steak sauce. Fish sauce. Oyster sauce. Cocktail sauce. Horseradish. Ketchup and mustard. Meat flavorings and tenderizers. Bouillon cubes. Hot sauce. Tabasco sauce. Marinades. Taco seasonings. Relishes. °Fats and Oils °Butter, stick margarine, lard, shortening, ghee, and bacon fat. Coconut, palm kernel, or palm oils. Regular salad dressings. °Other °Pickles and olives. Salted popcorn and pretzels. °The items listed above may not be a complete list of foods and beverages to avoid. Contact your dietitian for more information. °WHERE CAN I FIND MORE INFORMATION? °National Heart, Lung, and [employer] Institute: www.nhlbi.nih.gov/health/health-topics/topics/dash/ °Document Released: 09/13/2011 Document Revised: 02/08/2014 Document Reviewed: 07/29/2013 °ExitCare® Patient Information ©2015 [employer]. This information is not intended to replace advice given to you by your health care provider. Make sure you discuss any questions you have with your health care provider. ° °

## 2015-04-25 NOTE — Progress Notes (Signed)
Patient here to establish care Patient is concerned about her blood pressure Has been running high and feels the mediation is not helping Patient did state she took some of her moms HCTZ and it made her feel great

## 2015-04-25 NOTE — Progress Notes (Signed)
Patient Demographics  Kelsey Joyce, is a 29 y.o. female  ZOX:[id number]  [id number]  DOB - [birth year]  CC:  Chief Complaint  Patient presents with  . Establish Care       HPI: Kelsey Joyce is a 29 y.o. female here today to establish medical care.patient has history of hypertension, currently on lisinopril 40 mg daily, as per patient her blood pressure has been running high and she has tried her mother's blood pressure medication hydrochlorothiazide which helps her to control the blood pressure, today her blood pressure was elevated, was given clonidine repeat manual blood pressure is 140/80, she denies any headache dizziness chest and shortness of breath, she also history of GERD currently on Protonix, she does smoke cigarettes, counseled patient quit smoking. Patient has No headache, No chest pain, No abdominal pain - No Nausea, No new weakness tingling or numbness, No Cough - SOB.  No Known Allergies Past Medical History  Diagnosis Date  . Hypertension    Current Outpatient Prescriptions on File Prior to Visit  Medication Sig Dispense Refill  . oxyCODONE-acetaminophen (PERCOCET/ROXICET) 5-325 MG per tablet Take 2 tablets by mouth every 4 (four) hours as needed for severe pain. 15 tablet 0  . ondansetron (ZOFRAN ODT) 4 MG disintegrating tablet Take 1 tablet (4 mg total) by mouth every 8 (eight) hours as needed for nausea or vomiting. (Patient not taking: Reported on 03/17/2015) 10 tablet 0   No current facility-administered medications on file prior to visit.   Family History  Problem Relation Age of Onset  . Cancer Other     cervical  . Hypertension Mother   . Hypertension Paternal Aunt    History   Social History  . Marital Status: Single    Spouse Name: N/A  . Number of Children: N/A  . Years of Education: N/A   Occupational History  . Not on file.   Social History Main Topics  . Smoking status: Current Some Day Smoker -- 0.20 packs/day    Types:  Cigarettes  . Smokeless tobacco: Not on file  . Alcohol Use: Yes  . Drug Use: Not on file  . Sexual Activity: Yes   Other Topics Concern  . Not on file   Social History Narrative    Review of Systems: Constitutional: Negative for fever, chills, diaphoresis, activity change, appetite change and fatigue. HENT: Negative for ear pain, nosebleeds, congestion, facial swelling, rhinorrhea, neck pain, neck stiffness and ear discharge.  Eyes: Negative for pain, discharge, redness, itching and visual disturbance. Respiratory: Negative for cough, choking, chest tightness, shortness of breath, wheezing and stridor.  Cardiovascular: Negative for chest pain, palpitations and leg swelling. Gastrointestinal: Negative for abdominal distention. Genitourinary: Negative for dysuria, urgency, frequency, hematuria, flank pain, decreased urine volume, difficulty urinating and dyspareunia.  Musculoskeletal: Negative for back pain, joint swelling, arthralgia and gait problem. Neurological: Negative for dizziness, tremors, seizures, syncope, facial asymmetry, speech difficulty, weakness, light-headedness, numbness and headaches.  Hematological: Negative for adenopathy. Does not bruise/bleed easily. Psychiatric/Behavioral: Negative for hallucinations, behavioral problems, confusion, dysphoric mood, decreased concentration and agitation.    Objective:   Filed Vitals:   04/25/15 1555  BP: 140/80  Pulse:   Temp:   Resp:     Physical Exam: Constitutional: Patient appears well-developed and well-nourished. No distress. HENT: Normocephalic, atraumatic, External right and left ear normal. Oropharynx is clear and moist.  Eyes: Conjunctivae and EOM are normal. PERRLA, no scleral icterus. Neck: Normal ROM. Neck supple. No JVD. No tracheal deviation.  No thyromegaly. CVS: RRR, S1/S2 +, no murmurs, no gallops, no carotid bruit.  Pulmonary: Effort and breath sounds normal, no stridor, rhonchi, wheezes, rales.    Abdominal: Soft. BS +, no distension, tenderness, rebound or guarding.  Musculoskeletal: Normal range of motion. No edema and no tenderness.  Neuro: Alert. Normal reflexes, muscle tone coordination. No cranial nerve deficit. Skin: Skin is warm and dry. No rash noted. Not diaphoretic. No erythema. No pallor. Psychiatric: Normal mood and affect. Behavior, judgment, thought content normal.  Lab Results  Component Value Date   WBC 8.5 02/16/2015   HGB 11.7* 02/16/2015   HCT 33.5* 02/16/2015   MCV 82.5 02/16/2015   PLT 180 02/16/2015   Lab Results  Component Value Date   CREATININE 0.63 02/16/2015   BUN 7 02/16/2015   NA 140 02/16/2015   K 3.2* 02/16/2015   CL 116* 02/16/2015   CO2 19* 02/16/2015    No results found for: HGBA1C Lipid Panel     Component Value Date/Time   CHOL 126 10/14/2009 2108   TRIG 47 10/14/2009 2108   HDL 40 10/14/2009 2108   CHOLHDL 3.2 Ratio 10/14/2009 2108   VLDL 9 10/14/2009 2108   LDLCALC 77 10/14/2009 2108       Assessment and plan:   1. HYPERTENSION, BENIGN ESSENTIAL Advise patient for DASH diet, I have discontinued lisinopril, switched her to combination of  Lisinopril/hydrochlorothiazide, she will come back in 2 weeks per nurse visit BP check - lisinopril-hydrochlorothiazide (PRINZIDE,ZESTORETIC) 20-25 MG per tablet; Take 1 tablet by mouth daily.  Dispense: 90 tablet; Refill: 3  2. Gastroesophageal reflux disease, esophagitis presence not specified Advise patient for lifestyle modification, continue with Protonix - pantoprazole (PROTONIX) 20 MG tablet; Take 1 tablet (20 mg total) by mouth daily.  Dispense: 30 tablet; Refill: 3  3. Smoking Counseled patient to quit smoking  4. Family history of diabetes mellitus (DM) We'll check - Hemoglobin A1c  5. Screening  - COMPLETE METABOLIC PANEL WITH GFR - Vit D  25 hydroxy (rtn osteoporosis monitoring)    Return in about 3 months (around 07/26/2015), or if symptoms worsen or fail to  improve, for BP check in 2 weeks/Nurse Visit.    The patient was given clear instructions to go to ER or return to medical center if symptoms don't improve, worsen or new problems develop. The patient verbalized understanding. The patient was told to call to get lab results if they haven't heard anything in the next week.    This note has been created with Education officer, environmental. Any transcriptional errors are unintentional.   Doris Cheadle, MD

## 2015-04-26 ENCOUNTER — Telehealth: Payer: Self-pay

## 2015-04-26 LAB — VITAMIN D 25 HYDROXY (VIT D DEFICIENCY, FRACTURES): Vit D, 25-Hydroxy: 31 ng/mL (ref 30–100)

## 2015-04-26 NOTE — Telephone Encounter (Signed)
Patient not available Left message on voice mail to return our call 

## 2015-04-26 NOTE — Telephone Encounter (Signed)
-----   Message from Doris Cheadleeepak Advani, MD sent at 04/26/2015 11:40 AM EDT ----- Blood work reviewed noticed hemoglobin A1c of 5.8%, patient has prediabetes, call and advise patient for low carbohydrate diet.

## 2015-04-26 NOTE — Telephone Encounter (Signed)
Patient is returning phone call, please f/u °

## 2015-04-27 ENCOUNTER — Telehealth: Payer: Self-pay

## 2015-04-27 NOTE — Telephone Encounter (Signed)
Returned patient phone call and she is aware of her lab results 

## 2015-05-18 ENCOUNTER — Ambulatory Visit: Payer: No Typology Code available for payment source | Attending: Internal Medicine | Admitting: *Deleted

## 2015-05-18 VITALS — BP 119/79 | HR 81 | Temp 98.2°F | Resp 16 | Ht 63.0 in | Wt 122.8 lb

## 2015-05-18 DIAGNOSIS — I10 Essential (primary) hypertension: Secondary | ICD-10-CM | POA: Insufficient documentation

## 2015-05-18 DIAGNOSIS — Z720 Tobacco use: Secondary | ICD-10-CM | POA: Insufficient documentation

## 2015-05-18 MED ORDER — ONDANSETRON 4 MG PO TBDP: 4.0000 mg | ORAL_TABLET | Freq: Three times a day (TID) | ORAL | Status: DC | PRN

## 2015-05-18 NOTE — Patient Instructions (Signed)

## 2015-05-18 NOTE — Progress Notes (Signed)
Patient presents for BP check after starting lisinopril-HCTZ 20-25 mg daily Med list reviewed; states taking all meds as directed. Was unable to afford zofran at [place]. Rx resent to [facility]. Patient is not adding salt to foods or cooking with salt and using Mrs Sharilyn Sites as alternative to salt. Encouraged patient to choose foods with 5% or less of daily value for sodium. Patient eats fresh foods only Discussed walking 30 minutes per day for exercise. 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 Patient denies headaches, blurred vision, SHOB, chest pain  States she has a couple of episodes of bending over and rising quickly that have caused vertigo and nausea. Patient states she is taking BP med first thing in AM on empty stomach. Discussed taking after AM meal Discussed transitioning slowly from lying/reclining to sitting before standing to prevent lightheadedness Patient has home BP monitoring device. Patient instructed on most accurate way to take BP, to keep BP log with date and time and how she's feeling and bring log to all future visits. Discussed pre-diabetes and need for low carb choices. Patient states she loves soda and candy. Discussed keeping these as treats rather than mainstay. Patient states she will try.  Filed Vitals:   05/18/15 1036  BP: 119/79  Pulse: 81  Temp: 98.2 F (36.8 C)  Resp: 16    Patient advised to call for med refills at least 7 days before running out so as not to go without.  Patient aware that she is to f/u with PCP 3 months from last visit (Due 07/26/15) or sooner if vertigo continues  Patient given literature on Basic Carb Counting

## 2015-05-27 ENCOUNTER — Ambulatory Visit: Payer: No Typology Code available for payment source | Attending: Family Medicine

## 2015-07-29 ENCOUNTER — Encounter: Payer: Self-pay | Admitting: Family Medicine

## 2015-07-29 ENCOUNTER — Other Ambulatory Visit (HOSPITAL_COMMUNITY)
Admission: RE | Admit: 2015-07-29 | Discharge: 2015-07-29 | Disposition: A | Discharge status: Home / Self Care | Payer: No Typology Code available for payment source | Source: Ambulatory Visit | Attending: Family Medicine | Admitting: Family Medicine

## 2015-07-29 ENCOUNTER — Ambulatory Visit: Payer: No Typology Code available for payment source | Attending: Family Medicine | Admitting: Family Medicine

## 2015-07-29 VITALS — BP 107/74 | HR 81 | Temp 98.0°F | Resp 16 | Ht 62.0 in | Wt 123.0 lb

## 2015-07-29 DIAGNOSIS — N760 Acute vaginitis: Secondary | ICD-10-CM | POA: Insufficient documentation

## 2015-07-29 DIAGNOSIS — Z124 Encounter for screening for malignant neoplasm of cervix: Secondary | ICD-10-CM | POA: Diagnosis not present

## 2015-07-29 DIAGNOSIS — Z113 Encounter for screening for infections with a predominantly sexual mode of transmission: Secondary | ICD-10-CM

## 2015-07-29 DIAGNOSIS — Z0000 Encounter for general adult medical examination without abnormal findings: Secondary | ICD-10-CM | POA: Insufficient documentation

## 2015-07-29 DIAGNOSIS — Z79899 Other long term (current) drug therapy: Secondary | ICD-10-CM | POA: Insufficient documentation

## 2015-07-29 DIAGNOSIS — Z803 Family history of malignant neoplasm of breast: Secondary | ICD-10-CM | POA: Insufficient documentation

## 2015-07-29 DIAGNOSIS — Z1151 Encounter for screening for human papillomavirus (HPV): Secondary | ICD-10-CM | POA: Diagnosis present

## 2015-07-29 DIAGNOSIS — Z8049 Family history of malignant neoplasm of other genital organs: Secondary | ICD-10-CM | POA: Insufficient documentation

## 2015-07-29 DIAGNOSIS — Z01411 Encounter for gynecological examination (general) (routine) with abnormal findings: Secondary | ICD-10-CM | POA: Diagnosis present

## 2015-07-29 DIAGNOSIS — I10 Essential (primary) hypertension: Secondary | ICD-10-CM | POA: Diagnosis not present

## 2015-07-29 MED ORDER — LISINOPRIL-HYDROCHLOROTHIAZIDE 20-25 MG PO TABS: 1.0000 | ORAL_TABLET | Freq: Every day | ORAL | Status: DC

## 2015-07-29 MED ORDER — FLUCONAZOLE 150 MG PO TABS: 150.0000 mg | ORAL_TABLET | ORAL | Status: DC

## 2015-07-29 NOTE — Progress Notes (Signed)
Patient ID: Kelsey Joyce, female   DOB: [birth year], 29 y.o.   MRN: [id number] SUBJECTIVE:  29 y.o. female for annual routine Pap and checkup.  Family History  Problem Relation Age of Onset  . Cervical cancer  60    Grandmother  . Hypertension Mother   . Hypertension Paternal Aunt   . Breast cancer      Aunt  . Breast cancer      Great Aunt     Current Outpatient Prescriptions  Medication Sig Dispense Refill  . lisinopril-hydrochlorothiazide (PRINZIDE,ZESTORETIC) 20-25 MG per tablet Take 1 tablet by mouth daily. 90 tablet 3  . pantoprazole (PROTONIX) 20 MG tablet Take 1 tablet (20 mg total) by mouth daily. 30 tablet 3  . ondansetron (ZOFRAN ODT) 4 MG disintegrating tablet Take 1 tablet (4 mg total) by mouth every 8 (eight) hours as needed for nausea or vomiting. (Patient not taking: Reported on 07/29/2015) 10 tablet 0  . oxyCODONE-acetaminophen (PERCOCET/ROXICET) 5-325 MG per tablet Take 2 tablets by mouth every 4 (four) hours as needed for severe pain. (Patient not taking: Reported on 07/29/2015) 15 tablet 0   No current facility-administered medications for this visit.   Allergies: Acetaminophen  Patient's last menstrual period was 07/04/2015.  ROS:  Feeling well. No dyspnea or chest pain on exertion.  No abdominal pain, change in bowel habits, black or bloody stools.  No urinary tract symptoms. GYN ROS: normal menses, no abnormal bleeding, pelvic pain or discharge, no breast pain or new or enlarging lumps on self exam. No neurological complaints.  OBJECTIVE:  The patient appears well, alert, oriented x 3, in no distress. BP 107/74 mmHg  Pulse 81  Temp(Src) 98 F (36.7 C) (Oral)  Resp 16  Ht 5\' 2"  (1.575 m)  Wt 123 lb (55.792 kg)  BMI 22.49 kg/m2  SpO2 100%  LMP 07/04/2015 ENT normal.  Neck supple. No adenopathy or thyromegaly. PERLA. Lungs are clear, good air entry, no wheezes, rhonchi or rales. S1 and S2 normal, no murmurs, regular rate and rhythm. Abdomen soft without  tenderness, guarding, mass or organomegaly. Extremities show no edema, normal peripheral pulses. Neurological is normal, no focal findings.  BREAST EXAM: breasts appear normal, no suspicious masses, no skin or nipple changes or axillary nodes  PELVIC EXAM: VULVA: normal appearing vulva with no masses, tenderness or lesions, vulvar tenderness b.l, vulvar edema on R, vulvar excoriation on R, VAGINA: normal appearing vagina with normal color and discharge, no lesions, vaginal discharge - white, creamy and curd-like, CERVIX: normal appearing cervix without discharge or lesions, UTERUS: uterus is normal size, shape, consistency and nontender, ADNEXA: normal adnexa in size, nontender and no masses  ASSESSMENT:  well woman  PLAN:  pap smear Smoking cessation  return annually for physical Return in 3 months for HTN

## 2015-07-29 NOTE — Patient Instructions (Addendum)
Kelsey Joyce was seen today for vaginal itching.  Diagnoses and all orders for this visit:  Cervical cancer screening -     Cytology - PAP [place]  Screen for STD (sexually transmitted disease) -     Cytology - PAP [place] -     HIV antibody (with reflex) -     RPR  Vaginitis -     fluconazole (DIFLUCAN) 150 MG tablet; Take 1 tablet (150 mg total) by mouth every 3 (three) days.   Normal exam except  You do have inflammation of the vulva I believe this is from yeast  Treating with diflucan  F/u in 3 months for HTN F/u sooner to discuss birth control if you are interested   Dr. Armen PickupFunches

## 2015-07-29 NOTE — Progress Notes (Signed)
C/C vaginal itchy, possible boil on vaginal  No vaginal odor, no discharge  Breast exam  Hx breast cancer in the family No pain today  Hx tobacco 2 cigarette per day

## 2015-07-30 LAB — HIV ANTIBODY (ROUTINE TESTING W REFLEX): HIV 1&2 Ab, 4th Generation: NONREACTIVE

## 2015-07-30 LAB — RPR

## 2015-08-01 LAB — CERVICOVAGINAL ANCILLARY ONLY
Chlamydia: NEGATIVE
Neisseria Gonorrhea: NEGATIVE
Trichomonas: NEGATIVE

## 2015-08-01 LAB — CYTOLOGY - PAP

## 2015-08-04 ENCOUNTER — Telehealth: Payer: Self-pay | Admitting: Family Medicine

## 2015-08-04 DIAGNOSIS — R87612 Low grade squamous intraepithelial lesion on cytologic smear of cervix (LGSIL): Secondary | ICD-10-CM | POA: Insufficient documentation

## 2015-08-04 DIAGNOSIS — R87810 Cervical high risk human papillomavirus (HPV) DNA test positive: Principal | ICD-10-CM

## 2015-08-04 DIAGNOSIS — N760 Acute vaginitis: Secondary | ICD-10-CM

## 2015-08-04 DIAGNOSIS — R87610 Atypical squamous cells of undetermined significance on cytologic smear of cervix (ASC-US): Secondary | ICD-10-CM

## 2015-08-04 NOTE — Telephone Encounter (Signed)
Patient called  Kelsey Joyce is abnormal with ASCUS, HPV positive F/u colposcopy needed  [facility]   Patient's questions answered. Patient advised to quit smoking completely to improve chance of clearing HPV  Patient request Monday or Friday morning for colposcopy if available

## 2015-08-04 NOTE — Assessment & Plan Note (Signed)
[facility]

## 2015-08-08 NOTE — Telephone Encounter (Signed)
Patient called and requested to speak to PCP, patient stated that Difulcan is not working and she is still having the same symptoms. Please f/u

## 2015-08-11 MED ORDER — METRONIDAZOLE 500 MG PO TABS: 500.0000 mg | ORAL_TABLET | Freq: Two times a day (BID) | ORAL | Status: DC

## 2015-08-11 MED ORDER — FLUCONAZOLE 150 MG PO TABS
150.0000 mg | ORAL_TABLET | ORAL | Status: DC
Start: 2015-08-11 — End: 2015-11-08

## 2015-08-11 NOTE — Telephone Encounter (Signed)
Please inform patient  I have sent in flagyl as BV might be present this causes discharge which could cause irritaton. This is not an STD.  Do not take flagyl with alcohol.  Followed by diflucan  If symptoms do not improve f/u exam needed

## 2015-08-17 NOTE — Telephone Encounter (Signed)
Pt advised to pick up Flagyl  Avoid alcohol  If Sx do not improved f/u with PCP

## 2015-08-18 ENCOUNTER — Ambulatory Visit (INDEPENDENT_AMBULATORY_CARE_PROVIDER_SITE_OTHER): Payer: Self-pay | Admitting: Family Medicine

## 2015-08-18 VITALS — BP 120/89 | HR 86 | Temp 98.3°F | Ht 62.0 in | Wt 121.9 lb

## 2015-08-18 DIAGNOSIS — R87610 Atypical squamous cells of undetermined significance on cytologic smear of cervix (ASC-US): Secondary | ICD-10-CM

## 2015-08-18 DIAGNOSIS — R87810 Cervical high risk human papillomavirus (HPV) DNA test positive: Secondary | ICD-10-CM

## 2015-08-18 NOTE — Assessment & Plan Note (Signed)
Clinically normal colposcopy I would repeat PAP in one year

## 2015-08-18 NOTE — Progress Notes (Signed)
Patient ID: Kelsey Joyce, female   DOB: Jun 21, 1986, 29 y.o.   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  Placed in lithotomy position. Cervix viewed with speculum and colposcope after application of acetic acid.   Colposcopy adequate (entire squamocolumnar junctions seen  in entirety) ?  Yes easily Acetowhite lesions?no Punctation?no Mosaicism?  no Abnormal vasculature?  no Biopsies?no ECC?no Complications? none  COMMENTS: Patient was given post procedure instructions.  Clinically normal pap smear Repeat PAP 1 year for follow up..Marland Kitchen

## 2015-10-25 MED FILL — LISINOPRIL-HCTZ 20-25 MG TA: 20-25 | 30 days supply | Qty: 30 | Fill #3

## 2015-11-04 ENCOUNTER — Telehealth: Payer: Self-pay | Admitting: Family Medicine

## 2015-11-04 NOTE — Telephone Encounter (Signed)
Possibly UTI/yeast infection.Marland KitchenMarland KitchenMarland KitchenMarland Kitchenexperiencing irritation, small discharge...started 4 days ago....patient stated that the only thing she has done differently is used a different soap   .please follow up with patient

## 2015-11-08 ENCOUNTER — Encounter: Payer: Self-pay | Admitting: Family Medicine

## 2015-11-08 ENCOUNTER — Ambulatory Visit: Payer: Self-pay | Attending: Family Medicine | Admitting: Family Medicine

## 2015-11-08 VITALS — BP 98/66 | HR 87 | Temp 98.3°F | Resp 16 | Ht 63.0 in | Wt 121.0 lb

## 2015-11-08 DIAGNOSIS — N898 Other specified noninflammatory disorders of vagina: Secondary | ICD-10-CM

## 2015-11-08 DIAGNOSIS — I10 Essential (primary) hypertension: Secondary | ICD-10-CM

## 2015-11-08 LAB — POCT URINALYSIS DIPSTICK
Bilirubin, UA: NEGATIVE
Glucose, UA: NEGATIVE
Ketones, UA: NEGATIVE
Leukocytes, UA: NEGATIVE
Nitrite, UA: NEGATIVE
Protein, UA: NEGATIVE
Spec Grav, UA: 1.015
Urobilinogen, UA: 0.2
pH, UA: 6

## 2015-11-08 LAB — POCT URINE PREGNANCY: Preg Test, Ur: NEGATIVE

## 2015-11-08 MED ORDER — LISINOPRIL 20 MG PO TABS: 20.0000 mg | ORAL_TABLET | Freq: Every day | ORAL | Status: DC

## 2015-11-08 MED ORDER — FLUCONAZOLE 150 MG PO TABS: 150.0000 mg | ORAL_TABLET | ORAL | Status: DC

## 2015-11-08 MED FILL — LISINOPRIL 20 MG TABLET: 20 | 90 days supply | Qty: 90 | Fill #0

## 2015-11-08 MED FILL — FLUCONAZOLE 150 MG TABLET: 150 | 2 days supply | Qty: 2 | Fill #0

## 2015-11-08 NOTE — Progress Notes (Signed)
C/C vaginal discharge color white color and some smell  No pain today  Frequency urinating  Tobacco user [facility]  No suicidal thought in the past two weeks  No sexually active x 4 month

## 2015-11-08 NOTE — Telephone Encounter (Signed)
Schedule appointment today at 345  Pt aware

## 2015-11-08 NOTE — Patient Instructions (Addendum)
Kelsey Joyce was seen today for dysuria.  Diagnoses and all orders for this visit:  Vaginal discharge -     POCT urinalysis dipstick -     POCT urine pregnancy -     Urine cytology ancillary only -     fluconazole (DIFLUCAN) 150 MG tablet; Take 1 tablet (150 mg total) by mouth every 3 (three) days.  HYPERTENSION, BENIGN ESSENTIAL -     lisinopril (PRINIVIL,ZESTRIL) 20 MG tablet; Take 1 tablet (20 mg total) by mouth daily.   F/u in 4 weeks with Misty Stanley for BP  F/u in 6 months for HTN  Dr. Armen Pickup

## 2015-11-08 NOTE — Progress Notes (Signed)
Subjective:  Patient ID: Kelsey Joyce, female    DOB: 20-Oct-1985  Age: 30 y.o. MRN: [id number]  CC: Urinary Frequency and Vaginal Discharge   HPI Kelsey Joyce presents for    1. Urinary frequency: frequent urination on prinzide. No HA, CP, SOB, dizziness or fatigue.   2. Vaginal discharge: x 4 days. With pruritis. Not sexually active for several months. Used new body wash. No genital sores.   Social History  Substance Use Topics  . Smoking status: Current Some Day Smoker -- 0.20 packs/day    Types: Cigarettes  . Smokeless tobacco: Not on file     Comment: smoking 1-2 cigs per day  . Alcohol Use: 0.0 oz/week    0 Standard drinks or equivalent per week    Outpatient Prescriptions Prior to Visit  Medication Sig Dispense Refill  . fluconazole (DIFLUCAN) 150 MG tablet Take 1 tablet (150 mg total) by mouth every 3 (three) days. 2 tablet 0  . lisinopril-hydrochlorothiazide (PRINZIDE,ZESTORETIC) 20-25 MG tablet Take 1 tablet by mouth daily. 30 tablet 11  . metroNIDAZOLE (FLAGYL) 500 MG tablet Take 1 tablet (500 mg total) by mouth 2 (two) times daily. 14 tablet 0  . pantoprazole (PROTONIX) 20 MG tablet Take 1 tablet (20 mg total) by mouth daily. 30 tablet 3   No facility-administered medications prior to visit.    ROS Review of Systems  Constitutional: Negative for fever and chills.  Eyes: Negative for visual disturbance.  Respiratory: Negative for shortness of breath.   Cardiovascular: Negative for chest pain.  Gastrointestinal: Negative for abdominal pain and blood in stool.  Genitourinary: Positive for frequency and vaginal discharge. Negative for dysuria.  Musculoskeletal: Negative for back pain and arthralgias.  Skin: Negative for rash.  Allergic/Immunologic: Negative for immunocompromised state.  Hematological: Negative for adenopathy. Does not bruise/bleed easily.  Psychiatric/Behavioral: Negative for suicidal ideas and dysphoric mood.    Objective:  BP 98/66  mmHg  Pulse 87  Temp(Src) 98.3 F (36.8 C) (Oral)  Resp 16  Ht  (1.6 m)  Wt 121 lb (54.885 kg)  BMI 21.44 kg/m2  SpO2 99%  LMP 10/29/2015  BP/Weight 11/08/2015 08/18/2015 07/29/2015  Systolic BP 98 120 107  Diastolic BP 66 89 74  Wt. (Lbs) 121 121.9 123  BMI 21.44 22.29 22.49   Physical Exam  Constitutional: She is oriented to person, place, and time. She appears well-developed and well-nourished. No distress.  HENT:  Head: Normocephalic and atraumatic.  Cardiovascular: Normal rate, regular rhythm, normal heart sounds and intact distal pulses.   Pulmonary/Chest: Effort normal and breath sounds normal.  Musculoskeletal: She exhibits no edema.  Neurological: She is alert and oriented to person, place, and time.  Skin: Skin is warm and dry. No rash noted.  Psychiatric: She has a normal mood and affect.   UA: negative  U preg: negative   Assessment & Plan:  Kelsey Joyce was seen today for urinary frequency and vaginal discharge.  Diagnoses and all orders for this visit:  Vaginal discharge -     POCT urinalysis dipstick -     POCT urine pregnancy -     Urine cytology ancillary only -     fluconazole (DIFLUCAN) 150 MG tablet; Take 1 tablet (150 mg total) by mouth every 3 (three) days.  HYPERTENSION, BENIGN ESSENTIAL -     lisinopril (PRINIVIL,ZESTRIL) 20 MG tablet; Take 1 tablet (20 mg total) by mouth daily.    Meds ordered this encounter  Medications  . fluconazole (DIFLUCAN) 150  MG tablet    Sig: Take 1 tablet (150 mg total) by mouth every 3 (three) days.    Dispense:  2 tablet    Refill:  0  . lisinopril (PRINIVIL,ZESTRIL) 20 MG tablet    Sig: Take 1 tablet (20 mg total) by mouth daily.    Dispense:  90 tablet    Refill:  3    Stop prinzide    Follow-up: No Follow-up on file.   Dessa Phi MD

## 2015-11-09 NOTE — Assessment & Plan Note (Signed)
A: BP well controlled with frequency P: D/c prinzide 20-25 mg daily  Lisinopril 20 mg only

## 2015-11-09 NOTE — Assessment & Plan Note (Signed)
Suspect yeast Diflucan ordered  

## 2016-01-27 ENCOUNTER — Telehealth: Payer: Self-pay | Admitting: Family Medicine

## 2016-01-27 NOTE — Telephone Encounter (Signed)
Pt. Came into facility to drop off health examination paperwork for her PCP to fill out. Pt. Needs the paperwork by May 9th. Please f/u with pt.

## 2016-02-07 NOTE — Telephone Encounter (Signed)
Health Examination Certificate form received.  It is the patient's responsibility to read the form and complete any missed portions.   Patient needs the following:  1. TB skin test 2. Provide records of or to receive the following immunizations: MMR, tetanus, Hep C   Once all is done/provided the form can be signed.

## 2016-02-08 NOTE — Telephone Encounter (Signed)
Pt advised to schedule appointment for PPD Hep B and Tdap

## 2016-02-10 ENCOUNTER — Encounter (INDEPENDENT_AMBULATORY_CARE_PROVIDER_SITE_OTHER): Payer: Self-pay

## 2016-02-10 ENCOUNTER — Ambulatory Visit: Payer: Self-pay | Attending: Family Medicine

## 2016-02-10 DIAGNOSIS — Z0000 Encounter for general adult medical examination without abnormal findings: Secondary | ICD-10-CM

## 2016-02-10 DIAGNOSIS — Z23 Encounter for immunization: Secondary | ICD-10-CM | POA: Insufficient documentation

## 2016-02-10 MED ORDER — TUBERCULIN PPD 5 UNIT/0.1ML ID SOLN
5.0000 [IU] | Freq: Once | INTRADERMAL | Status: AC
  Administered 2016-02-10: 5 [IU] via INTRADERMAL

## 2016-02-10 MED ORDER — TETANUS-DIPHTH-ACELL PERTUSSIS 5-2.5-18.5 LF-MCG/0.5 IM SUSP
0.5000 mL | Freq: Once | INTRAMUSCULAR | Status: AC
  Administered 2016-02-10: 0.5 mL via INTRAMUSCULAR

## 2016-02-10 NOTE — Progress Notes (Signed)
Patient's here for PPD, Tdap and series 1 of 3 Hep B vaccines.  Per Francena HanlyStella patient was given instruction to return on Monday before 4pm for PPD reading.    Per Francena HanlyStella, Patient will be mailed her encounter with PPD reading for Monday with1 month, 6 month f/up for series 2 of 3 Hep B vaccine injection.  Tdap and Hep B administered by Louie BunKim B. Curse  PPD administered by Francena HanlyStella, CMA

## 2016-02-10 NOTE — Patient Instructions (Addendum)
Patient is advise to return within 48-72 hrs for reading. Patient will return on Monday before 4pm.  Please return on June 5th for 2nd of the 3rd Hep B vaccination, and Nov, 5th, 2017 for 3rd of the 3rd Hep B vaccination.

## 2016-02-13 ENCOUNTER — Ambulatory Visit: Payer: Self-pay | Attending: Family Medicine | Admitting: *Deleted

## 2016-02-13 ENCOUNTER — Other Ambulatory Visit: Payer: Self-pay | Admitting: Family Medicine

## 2016-02-13 DIAGNOSIS — Z9229 Personal history of other drug therapy: Secondary | ICD-10-CM

## 2016-02-13 DIAGNOSIS — Z111 Encounter for screening for respiratory tuberculosis: Secondary | ICD-10-CM | POA: Insufficient documentation

## 2016-02-13 DIAGNOSIS — Z789 Other specified health status: Secondary | ICD-10-CM | POA: Insufficient documentation

## 2016-02-13 LAB — READ PPD: TB Skin Test: NEGATIVE

## 2016-02-13 NOTE — Addendum Note (Signed)
Addended by: Dyann KiefGIRALDEZ, Allan Minotti M on: 02/13/2016 04:33 PM   Modules accepted: Orders

## 2016-02-13 NOTE — Patient Instructions (Signed)
Patient received hard copy of results

## 2016-02-14 ENCOUNTER — Encounter: Payer: Self-pay | Admitting: Family Medicine

## 2016-02-14 ENCOUNTER — Telehealth: Payer: Self-pay | Admitting: *Deleted

## 2016-02-14 LAB — MEASLES/MUMPS/RUBELLA IMMUNITY
Mumps IgG: 206 AU/mL — ABNORMAL HIGH (ref <9.00)
Rubella: 23 Index — ABNORMAL HIGH (ref <0.90)
Rubeola IgG: 32.7 AU/mL — ABNORMAL HIGH (ref <25.00)

## 2016-02-14 NOTE — Telephone Encounter (Signed)
-----  Message from Boykin Nearing, MD sent at 02/14/2016  3:08 PM EDT ----- Patient is immune to MMR She can come in to pick up letter of titers

## 2016-02-14 NOTE — Telephone Encounter (Signed)
Date of birth verified by pt  Pt immune to MMR  Will pick up letter tomorrow

## 2016-02-20 ENCOUNTER — Ambulatory Visit: Payer: Self-pay

## 2016-05-17 ENCOUNTER — Emergency Department (HOSPITAL_COMMUNITY)
Admission: EM | Admit: 2016-05-17 | Discharge: 2016-05-17 | Disposition: A | Discharge status: Home / Self Care | Payer: Self-pay | Attending: Emergency Medicine | Admitting: Emergency Medicine

## 2016-05-17 ENCOUNTER — Emergency Department (HOSPITAL_COMMUNITY): Payer: Self-pay

## 2016-05-17 ENCOUNTER — Encounter (HOSPITAL_COMMUNITY): Payer: Self-pay | Admitting: Neurology

## 2016-05-17 DIAGNOSIS — R55 Syncope and collapse: Secondary | ICD-10-CM

## 2016-05-17 DIAGNOSIS — F17210 Nicotine dependence, cigarettes, uncomplicated: Secondary | ICD-10-CM | POA: Insufficient documentation

## 2016-05-17 DIAGNOSIS — F419 Anxiety disorder, unspecified: Secondary | ICD-10-CM

## 2016-05-17 DIAGNOSIS — I10 Essential (primary) hypertension: Secondary | ICD-10-CM | POA: Insufficient documentation

## 2016-05-17 HISTORY — DX: Anxiety disorder, unspecified: F41.9

## 2016-05-17 LAB — BASIC METABOLIC PANEL
Anion gap: 6 (ref 5–15)
BUN: 10 mg/dL (ref 6–20)
CO2: 23 mmol/L (ref 22–32)
Calcium: 9.2 mg/dL (ref 8.9–10.3)
Chloride: 109 mmol/L (ref 101–111)
Creatinine, Ser: 0.72 mg/dL (ref 0.44–1.00)
GFR calc Af Amer: >60 mL/min (ref >60)
GFR calc non Af Amer: >60 mL/min (ref >60)
Glucose, Bld: 73 mg/dL (ref 65–99)
Potassium: 3.7 mmol/L (ref 3.5–5.1)
Sodium: 138 mmol/L (ref 135–145)

## 2016-05-17 LAB — URINE MICROSCOPIC-ADD ON

## 2016-05-17 LAB — CBC WITH DIFFERENTIAL/PLATELET
Basophils Absolute: 0 10*3/uL (ref 0.0–0.1)
Basophils Relative: 0 %
Eosinophils Absolute: 0.1 10*3/uL (ref 0.0–0.7)
Eosinophils Relative: 1 %
HCT: 36.1 % (ref 36.0–46.0)
Hemoglobin: 12.3 g/dL (ref 12.0–15.0)
Lymphocytes Relative: 36 %
Lymphs Abs: 3.5 10*3/uL (ref 0.7–4.0)
MCH: 29.1 pg (ref 26.0–34.0)
MCHC: 34.1 g/dL (ref 30.0–36.0)
MCV: 85.3 fL (ref 78.0–100.0)
Monocytes Absolute: 0.8 10*3/uL (ref 0.1–1.0)
Monocytes Relative: 8 %
Neutro Abs: 5.5 10*3/uL (ref 1.7–7.7)
Neutrophils Relative %: 55 %
Platelets: 183 10*3/uL (ref 150–400)
RBC: 4.23 MIL/uL (ref 3.87–5.11)
RDW: 15.1 % (ref 11.5–15.5)
WBC: 9.9 10*3/uL (ref 4.0–10.5)

## 2016-05-17 LAB — URINALYSIS, ROUTINE W REFLEX MICROSCOPIC
Bilirubin Urine: NEGATIVE
Glucose, UA: NEGATIVE mg/dL
Ketones, ur: NEGATIVE mg/dL
Nitrite: NEGATIVE
Protein, ur: NEGATIVE mg/dL
Specific Gravity, Urine: 1.012 (ref 1.005–1.030)
pH: 6 (ref 5.0–8.0)

## 2016-05-17 LAB — I-STAT TROPONIN, ED: Troponin i, poc: 0 ng/mL (ref 0.00–0.08)

## 2016-05-17 LAB — CBG MONITORING, ED: Glucose-Capillary: 99 mg/dL (ref 65–99)

## 2016-05-17 MED ORDER — SODIUM CHLORIDE 0.9 % IV BOLUS (SEPSIS)
1000.0000 mL | Freq: Once | INTRAVENOUS | Status: AC
  Administered 2016-05-17: 1000 mL via INTRAVENOUS

## 2016-05-17 MED ORDER — LORAZEPAM 2 MG/ML IJ SOLN
1.0000 mg | Freq: Once | INTRAMUSCULAR | Status: AC
  Administered 2016-05-17: 1 mg via INTRAVENOUS
  Filled 2016-05-17: qty 1

## 2016-05-17 NOTE — ED Notes (Signed)
Pt ambulated to restroom with no difficulty. Pt denies dizziness

## 2016-05-17 NOTE — ED Provider Notes (Signed)
[facility]-EMERGENCY DEPT Provider Note   CSN: [id number] Arrival date & time: 05/17/16  4098  First Provider Contact:  First MD Initiated Contact with Patient 05/17/16 [phone]        History   Chief Complaint Chief Complaint  Patient presents with  . Chest Pain  . Anxiety    HPI Kelsey Joyce is a 30 y.o. female.  HPI Kelsey Joyce is a 30 y.o. female with PMH significant for anxiety, panic attacks, and HTN who presents with anxiety.  Patient reports she had been standing at work for a while this morning around 7 AM when she began feeling like she was going to pass out  She describes this as feeling weak all over and like her legs were going to give out.  She did not syncopize or have LOC.  She states she sat down and then began to have increased anxiety that then progressed into a panic attack.  She began having chest tightness, shortness of breath, and felt tense throughout her upper body. Similar to previous panic attacks. This has steadily been improving since her arrival in the ED.  No medication changes.  Normal PO intake.  No family hx of SCD.  Intermittent tobacco use.  No illicit drug use.  No hx of PE.  No exogenous estrogen, recent travel, or surgeries.  Past Medical History:  Diagnosis Date  . Anxiety   . Hypertension     Patient Active Problem List   Diagnosis Date Noted  . Vaginal discharge 11/08/2015  . ASCUS with positive high risk HPV cervical 08/04/2015  . Esophageal reflux 04/25/2015  . Smoking 04/25/2015  . Family history of diabetes mellitus (DM) 04/25/2015  . HYPERTENSION, BENIGN ESSENTIAL 07/08/2009    History reviewed. No pertinent surgical history.  OB History    No data available       Home Medications    Prior to Admission medications   Medication Sig Start Date End Date Taking? Authorizing Provider  ibuprofen (ADVIL,MOTRIN) 200 MG tablet Take 200 mg by mouth every 6 (six) hours as needed for moderate pain.   Yes Historical Provider, MD    lisinopril-hydrochlorothiazide (PRINZIDE,ZESTORETIC) 20-25 MG tablet Take 1 tablet by mouth daily. 03/26/16  Yes Historical Provider, MD  Multiple Vitamin (MULTIVITAMIN) tablet Take 1 tablet by mouth daily.   Yes Historical Provider, MD  fluconazole (DIFLUCAN) 150 MG tablet Take 1 tablet (150 mg total) by mouth every 3 (three) days. Patient not taking: Reported on 05/17/2016 11/08/15   Dessa Phi, MD  lisinopril (PRINIVIL,ZESTRIL) 20 MG tablet Take 1 tablet (20 mg total) by mouth daily. Patient not taking: Reported on 05/17/2016 11/08/15   Dessa Phi, MD  pantoprazole (PROTONIX) 20 MG tablet Take 1 tablet (20 mg total) by mouth daily. Patient not taking: Reported on 05/17/2016 04/25/15   Doris Cheadle, MD    Family History Family History  Problem Relation Age of Onset  . Hypertension Mother   . Cervical cancer  60    Grandmother  . Hypertension Paternal Aunt   . Breast cancer      Aunt  . Breast cancer      Great Aunt     Social History Social History  Substance Use Topics  . Smoking status: Current Some Day Smoker    Packs/day: 0.20    Types: Cigarettes  . Smokeless tobacco: Never Used     Comment: smoking 1-2 cigs per day  . Alcohol use 0.0 oz/week     Allergies   Acetaminophen  Review of Systems Review of Systems All other systems negative unless otherwise stated in HPI   Physical Exam Updated Vital Signs BP (!) 148/110 (BP Location: Right Arm)   Pulse 67   Temp 98.9 F (37.2 C) (Oral)   Resp 14   LMP 04/09/2016   SpO2 100%   Physical Exam  Constitutional: She is oriented to person, place, and time. She appears well-developed and well-nourished.  Non-toxic appearance. She does not have a sickly appearance. She does not appear ill.  HENT:  Head: Normocephalic and atraumatic.  Mouth/Throat: Oropharynx is clear and moist.  Eyes: Conjunctivae are normal. Pupils are equal, round, and reactive to light.  Neck: Normal range of motion. Neck supple.   Cardiovascular: Normal rate and regular rhythm.   Pulmonary/Chest: Effort normal and breath sounds normal. No accessory muscle usage or stridor. No respiratory distress. She has no wheezes. She has no rhonchi. She has no rales.  Abdominal: Soft. Bowel sounds are normal. She exhibits no distension. There is no tenderness.  Musculoskeletal: Normal range of motion.  Lymphadenopathy:    She has no cervical adenopathy.  Neurological: She is alert and oriented to person, place, and time.  Mental Status:   AOx3.  Speech clear without dysarthria. Cranial Nerves:  I-not tested  II-PERRLA  III, IV, VI-EOMs intact  V-temporal and masseter strength intact  VII-symmetrical facial movements intact, no facial droop  VIII-hearing grossly intact bilaterally  IX, X-gag intact  XI-strength of sternomastoid and trapezius muscles 5/5  XII-tongue midline Motor:   Good muscle bulk and tone  Strength 5/5 bilaterally in upper and lower extremities   Cerebellar--intact RAMs, finger to nose intact bilaterally.  Gait normal.  No pronator drift Sensory:  Intact in upper and lower extremities   Skin: Skin is warm and dry.  Psychiatric: She has a normal mood and affect. Her behavior is normal.     ED Treatments / Results  Labs (all labs ordered are listed, but only abnormal results are displayed) Labs Reviewed  URINALYSIS, ROUTINE W REFLEX MICROSCOPIC (NOT AT [facility]) - Abnormal; Notable for the following:       Result Value   APPearance CLOUDY (*)    Hgb urine dipstick TRACE (*)    Leukocytes, UA TRACE (*)    All other components within normal limits  URINE MICROSCOPIC-ADD ON - Abnormal; Notable for the following:    Squamous Epithelial / LPF 6-30 (*)    Bacteria, UA FEW (*)    All other components within normal limits  CBC WITH DIFFERENTIAL/PLATELET  BASIC METABOLIC PANEL  I-STAT TROPOININ, ED  CBG MONITORING, ED    EKG  EKG Interpretation  Date/Time:  Thursday May 17 2016 08:45:04  EDT Ventricular Rate:  74 PR Interval:    QRS Duration: 91 QT Interval:  399 QTC Calculation: 443 R Axis:   75 Text Interpretation:  Sinus rhythm Consider left atrial enlargement ST elev, probable normal early repol pattern No previous tracing Confirmed by KNOTT MD, DANIEL [id number]) on 05/17/2016 8:49:07 AM       Radiology Dg Chest 2 View  Result Date: 05/17/2016 CLINICAL DATA:  Chest pain and shortness of Breath EXAM: CHEST  2 VIEW COMPARISON:  None. FINDINGS: The heart size and mediastinal contours are within normal limits. Both lungs are clear. The visualized skeletal structures are unremarkable. Nipple shadows are noted bilaterally. IMPRESSION: No active cardiopulmonary disease. Electronically Signed   By: Alcide CleverMark  Lukens M.D.   On: 05/17/2016 10:16    Procedures Procedures (  including critical care time)  Medications Ordered in ED Medications  LORazepam (ATIVAN) injection 1 mg (1 mg Intravenous Given 05/17/16 0927)  sodium chloride 0.9 % bolus 1,000 mL (0 mLs Intravenous Stopped 05/17/16 1026)     Initial Impression / Assessment and Plan / ED Course  I have reviewed the triage vital signs and the nursing notes.  Pertinent labs & imaging results that were available during my care of the patient were reviewed by me and considered in my medical decision making (see chart for details).  Clinical Course   Patient presents with presyncope followed by a panic attack.  She feels back to baseline now.  Normal neurological exam.  Heart RRR, lungs CTAB, abdomen soft and benign.  She is not hypoglycemic.  No metabolic derangements.  EKG without acute abnormalities. Troponin normal. HEART score 2.   No family hx of SCD.  Low suspicion for arrhythmia or ACS. Low risk using SF syncope rules.  No SOB, low suspicion for PE, PERC negative.  She is not orthostatic.  Able to ambulate in ED without difficulty. I suspect this is stress related and due to prolonged standing.  Patient received IVF and Ativan  in ED with improvement.  Follow up PCP.  Return precautions discussed.   Final Clinical Impressions(s) / ED Diagnoses   Final diagnoses:  Anxiety  Near syncope    New Prescriptions New Prescriptions   No medications on file        Gwinda Maine 05/17/16 1043    Lyndal Pulley, MD 05/17/16 [phone]

## 2016-05-17 NOTE — ED Notes (Signed)
Pt returned from X-ray.  

## 2016-05-17 NOTE — ED Notes (Signed)
Checked patient blood sugar it was 99 notified RN Hope of blood sugar

## 2016-05-17 NOTE — ED Triage Notes (Signed)
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 She became anxious and developed CP. Anxiety progressed to a panic attack and she began to feel like she was going to pass out. Pt did not have LOC or fall. Has become more calm, but reports significant stress and central cp when breathing. Is tearful. BP 160/110, HR 77, EKG unremarkable. 18 gauge left forearm.

## 2016-07-17 MED FILL — LISINOPRIL 20 MG TABLET: 20 | 90 days supply | Qty: 90 | Fill #1

## 2016-10-17 ENCOUNTER — Other Ambulatory Visit: Payer: Self-pay | Admitting: Family Medicine

## 2016-10-17 DIAGNOSIS — I10 Essential (primary) hypertension: Secondary | ICD-10-CM

## 2016-10-17 MED FILL — LISINOPRIL 20 MG TABLET: 20 | 90 days supply | Qty: 90 | Fill #2

## 2016-10-30 ENCOUNTER — Telehealth: Payer: Self-pay | Admitting: Family Medicine

## 2016-10-30 ENCOUNTER — Ambulatory Visit (HOSPITAL_COMMUNITY)
Admission: EM | Admit: 2016-10-30 | Discharge: 2016-10-30 | Disposition: A | Discharge status: Home / Self Care | Payer: Self-pay | Attending: Family Medicine | Admitting: Family Medicine

## 2016-10-30 ENCOUNTER — Encounter (HOSPITAL_COMMUNITY): Payer: Self-pay | Admitting: Emergency Medicine

## 2016-10-30 DIAGNOSIS — I10 Essential (primary) hypertension: Secondary | ICD-10-CM

## 2016-10-30 DIAGNOSIS — R51 Headache: Secondary | ICD-10-CM

## 2016-10-30 DIAGNOSIS — R519 Headache, unspecified: Secondary | ICD-10-CM

## 2016-10-30 MED ORDER — DICLOFENAC SODIUM 75 MG PO TBEC: 75.0000 mg | DELAYED_RELEASE_TABLET | Freq: Two times a day (BID) | ORAL | 0 refills | Status: DC

## 2016-10-30 MED ORDER — METOCLOPRAMIDE HCL 10 MG PO TABS: 10.0000 mg | ORAL_TABLET | Freq: Four times a day (QID) | ORAL | 0 refills | Status: DC

## 2016-10-30 NOTE — Telephone Encounter (Signed)
Patient called to speak with PCP regarding the reaction that she is having to lisinopril. It's causing her tension all day and therefore she has to take ibuprofen as well. Please follow up.   Thank you.

## 2016-10-30 NOTE — Discharge Instructions (Signed)
For your headache I have sent two prescriptions to your pharmacy, diclofenac, take 1 tablet twice a day for pain, and for nausea I have sent reglan, take 1 tablet up to 4 times a day. For the fluid in your ear, use over the counter flonase, two sprays each nostril once a day. For your hypertension, I recommend you schedule an appointment with your primary care provider to evaluate your hypertension and make any adjustments that are necessary.

## 2016-10-30 NOTE — ED Triage Notes (Signed)
[facility] with a complaint of hypertension and a headache for 3 days.

## 2016-10-30 NOTE — Telephone Encounter (Signed)
Will route to PCP 

## 2016-10-30 NOTE — ED Provider Notes (Signed)
CSN: [id number]     Arrival date & time 10/30/16  1858 History   None    Chief Complaint  Patient presents with  . Hypertension   (Consider location/radiation/quality/duration/timing/severity/associated sxs/prior Treatment) 31 year old female presents to clinic with 3-4 day history of headache and nausea as well as elevated Bp. She reports her PCP recently stopped her HCTZ and her BP has been elevating. Currently it is 150/113. She describes her headache as dull, aching, bilateral over her whole head but worse in the frontal/sinus area. She denies any light or sound sensitivity. She does have nausea but has not vomited, no fever, no neck pain.   The history is provided by the patient.  Hypertension     Past Medical History:  Diagnosis Date  . Anxiety   . Hypertension    History reviewed. No pertinent surgical history. Family History  Problem Relation Age of Onset  . Hypertension Mother   . Cervical cancer  60    Grandmother  . Hypertension Paternal Aunt   . Breast cancer      Aunt  . Breast cancer      Great Aunt    Social History  Substance Use Topics  . Smoking status: Current Some Day Smoker    Packs/day: 0.20    Types: Cigarettes  . Smokeless tobacco: Never Used     Comment: smoking 1-2 cigs per day  . Alcohol use 0.0 oz/week   OB History    No data available     Review of Systems  Reason unable to perform ROS: as covered in HPI.  All other systems reviewed and are negative.   Allergies  Acetaminophen  Home Medications   Prior to Admission medications   Medication Sig Start Date End Date Taking? Authorizing Provider  ibuprofen (ADVIL,MOTRIN) 200 MG tablet Take 200 mg by mouth every 6 (six) hours as needed for moderate pain.   Yes Historical Provider, MD  lisinopril (PRINIVIL,ZESTRIL) 20 MG tablet Take 1 tablet (20 mg total) by mouth daily. 11/08/15  Yes Josalyn Funches, MD  diclofenac (VOLTAREN) 75 MG EC tablet Take 1 tablet (75 mg total) by mouth 2  (two) times daily. 10/30/16   Dorena Bodo, NP  metoCLOPramide (REGLAN) 10 MG tablet Take 1 tablet (10 mg total) by mouth 4 (four) times daily. 10/30/16   Dorena Bodo, NP   Meds Ordered and Administered this Visit  Medications - No data to display  BP (!) 150/113 (BP Location: Left Arm)   Pulse 73   Temp 98.2 F (36.8 C) (Oral)   Resp 16   SpO2 100%  No data found.   Physical Exam  Constitutional: Vital signs are normal. She appears well-developed and well-nourished. No distress.  HENT:  Head: Normocephalic and atraumatic.  Right Ear: Tympanic membrane and external ear normal.  Left Ear: Tympanic membrane and external ear normal.  Nose: Nose normal. Right sinus exhibits no frontal sinus tenderness. Left sinus exhibits no frontal sinus tenderness.  Mouth/Throat: Uvula is midline and oropharynx is clear and moist.  Eyes: Conjunctivae and EOM are normal. Pupils are equal, round, and reactive to light.  Neck: Normal range of motion and full passive range of motion without pain. Neck supple.  Cardiovascular: Normal rate and regular rhythm.   No murmur heard. Pulmonary/Chest: Effort normal and breath sounds normal. No respiratory distress.  Abdominal: Soft. There is no tenderness.  Musculoskeletal: Normal range of motion.  Neurological: She is alert.  Skin: Skin is warm and dry.  Capillary refill takes less than 2 seconds.  Psychiatric: She has a normal mood and affect.  Nursing note and vitals reviewed.   Urgent Care Course     Procedures (including critical care time)  Labs Review Labs Reviewed - No data to display  Imaging Review No results found.   Visual Acuity Review  Right Eye Distance:   Left Eye Distance:   Bilateral Distance:    Right Eye Near:   Left Eye Near:    Bilateral Near:         MDM   1. Acute nonintractable headache, unspecified headache type   2. Essential hypertension   For your headache I have sent two prescriptions to your  pharmacy, diclofenac, take 1 tablet twice a day for pain, and for nausea I have sent reglan, take 1 tablet up to 4 times a day. For the fluid in your ear, use over the counter flonase, two sprays each nostril once a day. For your hypertension, I recommend you schedule an appointment with your primary care provider to evaluate your hypertension and make any adjustments that are necessary.     Dorena BodoLawrence Melainie Krinsky, NP 10/30/16 2044

## 2016-10-31 ENCOUNTER — Emergency Department (HOSPITAL_COMMUNITY)
Admission: EM | Admit: 2016-10-31 | Discharge: 2016-10-31 | Disposition: A | Discharge status: Home / Self Care | Payer: Self-pay | Attending: Emergency Medicine | Admitting: Emergency Medicine

## 2016-10-31 ENCOUNTER — Telehealth: Payer: Self-pay | Admitting: Family Medicine

## 2016-10-31 ENCOUNTER — Encounter (HOSPITAL_COMMUNITY): Payer: Self-pay | Admitting: *Deleted

## 2016-10-31 DIAGNOSIS — I10 Essential (primary) hypertension: Secondary | ICD-10-CM | POA: Insufficient documentation

## 2016-10-31 DIAGNOSIS — F17210 Nicotine dependence, cigarettes, uncomplicated: Secondary | ICD-10-CM | POA: Insufficient documentation

## 2016-10-31 DIAGNOSIS — R030 Elevated blood-pressure reading, without diagnosis of hypertension: Secondary | ICD-10-CM

## 2016-10-31 DIAGNOSIS — Z79899 Other long term (current) drug therapy: Secondary | ICD-10-CM | POA: Insufficient documentation

## 2016-10-31 NOTE — ED Notes (Signed)
Called pt to be taken back to a room with no response.

## 2016-10-31 NOTE — ED Provider Notes (Signed)
[facility]-EMERGENCY DEPT Provider Note   CSN: [id number] Arrival date & time: 10/31/16  1216   By signing my name below, I, Soijett Blue, attest that this documentation has been prepared under the direction and in the presence of Lavera Guise, MD. Electronically Signed: Soijett Blue, ED Scribe. 10/31/16. 2:04 PM.  History   Chief Complaint Chief Complaint  Patient presents with  . Hypertension    HPI Kelsey Joyce is a 31 y.o. female with a PMHx of HTN, who presents to the Emergency Department complaining of elevated blood pressure onset 3 days ago. Pt notes that in the latter portion of last year, her PCP changed her HTN medications from lisinopril-HCTZ to lisinopril that she has been taking as prescribed. Since then BP has not as been well controlled as prior, but seems worse over past few days. Pt notes that her most recent elevated blood pressure reading was 164/111. Pt states that she has an appointment with her PCP on 11/07/2016 at 8:30 AM. Pt is having associated symptoms of HA, fatigue, and nasal congestion. She has tried Rx lisinopril and ibuprofen with no relief of her symptoms. She denies cough, rhinorrhea, vomiting, diarrhea, vision changes, speech change, abdominal pain, CP, diet change, leg swelling, difficulty urinating, and any other symptoms.    Per pt chart review: Pt was [facility] at [facility] on 10/30/2016 for HTN and HA. Pt was Rx diclofenac, reglan, and flonase, for their symptoms. Pt was informed to schedule an appointment with her PCP for follow up for her HTN.   The history is provided by the patient. No language interpreter was used.    Past Medical History:  Diagnosis Date  . Anxiety   . Hypertension     Patient Active Problem List   Diagnosis Date Noted  . Vaginal discharge 11/08/2015  . ASCUS with positive high risk HPV cervical 08/04/2015  . Esophageal reflux 04/25/2015  . Smoking 04/25/2015  . Family history of diabetes mellitus (DM) 04/25/2015  .  HYPERTENSION, BENIGN ESSENTIAL 07/08/2009    History reviewed. No pertinent surgical history.  OB History    No data available       Home Medications    Prior to Admission medications   Medication Sig Start Date End Date Taking? Authorizing Provider  diclofenac (VOLTAREN) 75 MG EC tablet Take 1 tablet (75 mg total) by mouth 2 (two) times daily. 10/30/16   Dorena Bodo, NP  ibuprofen (ADVIL,MOTRIN) 200 MG tablet Take 200 mg by mouth every 6 (six) hours as needed for moderate pain.    Historical Provider, MD  lisinopril (PRINIVIL,ZESTRIL) 20 MG tablet Take 1 tablet (20 mg total) by mouth daily. 11/08/15   Josalyn Funches, MD  metoCLOPramide (REGLAN) 10 MG tablet Take 1 tablet (10 mg total) by mouth 4 (four) times daily. 10/30/16   Dorena Bodo, NP    Family History Family History  Problem Relation Age of Onset  . Hypertension Mother   . Cervical cancer  60    Grandmother  . Hypertension Paternal Aunt   . Breast cancer      Aunt  . Breast cancer      Great Aunt     Social History Social History  Substance Use Topics  . Smoking status: Current Some Day Smoker    Packs/day: 0.20    Types: Cigarettes  . Smokeless tobacco: Never Used     Comment: smoking 1-2 cigs per day  . Alcohol use 0.0 oz/week     Allergies  Acetaminophen   Review of Systems Review of Systems 10/14 systems reviewed and are negative other than those stated in the HPI  Physical Exam Updated Vital Signs BP (!) 155/108 (BP Location: Right Arm)   Pulse 66   Temp 98.4 F (36.9 C) (Oral)   Resp 16   SpO2 100%   Physical Exam Physical Exam  Nursing note and vitals reviewed. Constitutional: Well developed, well nourished, non-toxic, and in no acute distress Head: Normocephalic and atraumatic.  Mouth/Throat: Oropharynx is clear and moist.  Neck: Normal range of motion. Neck supple.  Cardiovascular: Normal rate and regular rhythm.   Pulmonary/Chest: Effort normal and breath sounds  normal.  Abdominal: Soft. There is no tenderness. There is no rebound and no guarding.  Musculoskeletal: Normal range of motion.  Neurological: Alert, no facial droop, fluent speech, moves all extremities symmetrically Skin: Skin is warm and dry.  Psychiatric: Cooperative  ED Treatments / Results  DIAGNOSTIC STUDIES: Oxygen Saturation is 100% on RA, nl by my interpretation.    COORDINATION OF CARE: 2:04 PM Discussed treatment plan with pt at bedside which includes follow up with PCP appointment and pt agreed to plan.   Procedures Procedures (including critical care time)  Medications Ordered in ED Medications - No data to display   Initial Impression / Assessment and Plan / ED Course  I have reviewed the triage vital signs and the nursing notes.  31 year old female with history of hypertension who presents with elevated blood pressures. She is nontoxic in no acute distress. Blood pressure 150 over 100s here in the ED. Aside from mild HA, overall asymptomatic. Exam reassuring. No concern for end organ damage at this time. Patient has follow-up in 6 days with PCP. She will continue to document BP at home and take to PCP for medication changes. Strict return and follow-up instructions reviewed. She expressed understanding of all discharge instructions and felt comfortable with the plan of care.   Final Clinical Impressions(s) / ED Diagnoses   Final diagnoses:  Elevated blood pressure reading    New Prescriptions New Prescriptions   No medications on file   I personally performed the services described in this documentation, which was scribed in my presence. The recorded information has been reviewed and is accurate.     Lavera Guiseana Duo Cesareo Vickrey, MD 10/31/16 [phone]

## 2016-10-31 NOTE — ED Triage Notes (Signed)
Pt reports having hx of HTN, has been taking meds as prescribed but reports high bp since Sunday. Highest recorded bp at home was 164/111. Reports having headache and "not feeling well." went to [facility] last night but only prescribed medicine for headache.

## 2016-10-31 NOTE — Discharge Instructions (Signed)
Please continue to document your BP at home for your PCP appointment  Return for worsening symptoms, including intractable vomiting, escalating pain, confusion, or any other symptoms concerning to you.

## 2016-10-31 NOTE — Telephone Encounter (Signed)
Apt made for pt with PCP on tomorrow. Pt aware. Informed to be here 15 minutes prior to apt.

## 2016-10-31 NOTE — Telephone Encounter (Signed)
167/118 checked at 2:30pm...  Patient's bp has been reading high. She is currently taking her medications. Patient has been to the urgent care last night and ed today and is concerned because she is not feeling any better.   Patient was scheduled to be seen next Wed. 1/31 with Scot Juniffany Noel on the walk in template.  She was last seen in May by Dr. Armen PickupFunches Please follow up

## 2016-11-01 ENCOUNTER — Ambulatory Visit: Payer: Self-pay | Attending: Family Medicine | Admitting: Family Medicine

## 2016-11-01 ENCOUNTER — Encounter: Payer: Self-pay | Admitting: Family Medicine

## 2016-11-01 VITALS — BP 150/110 | HR 86 | Temp 97.9°F | Ht 63.0 in | Wt 129.6 lb

## 2016-11-01 DIAGNOSIS — I10 Essential (primary) hypertension: Secondary | ICD-10-CM

## 2016-11-01 DIAGNOSIS — F17210 Nicotine dependence, cigarettes, uncomplicated: Secondary | ICD-10-CM | POA: Insufficient documentation

## 2016-11-01 DIAGNOSIS — J3489 Other specified disorders of nose and nasal sinuses: Secondary | ICD-10-CM

## 2016-11-01 DIAGNOSIS — R51 Headache: Secondary | ICD-10-CM | POA: Insufficient documentation

## 2016-11-01 DIAGNOSIS — Z79899 Other long term (current) drug therapy: Secondary | ICD-10-CM | POA: Insufficient documentation

## 2016-11-01 DIAGNOSIS — Z8249 Family history of ischemic heart disease and other diseases of the circulatory system: Secondary | ICD-10-CM | POA: Insufficient documentation

## 2016-11-01 LAB — COMPLETE METABOLIC PANEL WITH GFR
ALT: 8 U/L (ref 6–29)
AST: 15 U/L (ref 10–30)
Albumin: 3.8 g/dL (ref 3.6–5.1)
Alkaline Phosphatase: 60 U/L (ref 33–115)
BUN: 10 mg/dL (ref 7–25)
CO2: 25 mmol/L (ref 20–31)
Calcium: 9 mg/dL (ref 8.6–10.2)
Chloride: 105 mmol/L (ref 98–110)
Creat: 0.78 mg/dL (ref 0.50–1.10)
GFR, Est African American: >89 mL/min (ref >=60)
GFR, Est Non African American: >89 mL/min (ref >=60)
Glucose, Bld: 88 mg/dL (ref 65–99)
Potassium: 4.4 mmol/L (ref 3.5–5.3)
Sodium: 139 mmol/L (ref 135–146)
Total Bilirubin: 0.3 mg/dL (ref 0.2–1.2)
Total Protein: 7.1 g/dL (ref 6.1–8.1)

## 2016-11-01 LAB — TSH: TSH: 0.66 mIU/L

## 2016-11-01 MED ORDER — CLONIDINE HCL 0.1 MG PO TABS
0.2000 mg | ORAL_TABLET | Freq: Once | ORAL | Status: AC
  Administered 2016-11-01: 0.2 mg via ORAL

## 2016-11-01 MED ORDER — AMLODIPINE BESYLATE 10 MG PO TABS: 10.0000 mg | ORAL_TABLET | Freq: Every day | ORAL | 5 refills | Status: DC

## 2016-11-01 MED ORDER — CETIRIZINE HCL 10 MG PO TABS: 10.0000 mg | ORAL_TABLET | Freq: Every day | ORAL | 11 refills | Status: DC

## 2016-11-01 MED ORDER — FLUTICASONE PROPIONATE 50 MCG/ACT NA SUSP: 2.0000 | Freq: Every day | NASAL | 6 refills | Status: DC

## 2016-11-01 MED FILL — ?AMLODIPINE BESYLATE 10 MG: 10 | 30 days supply | Qty: 30 | Fill #0

## 2016-11-01 MED FILL — FLUTICASONE PROP 50 MCG SPR: 50 | 30 days supply | Qty: 16 | Fill #0

## 2016-11-01 MED FILL — ?CETIRIZINE HCL 10 MG TABLE: 10 | 30 days supply | Qty: 30 | Fill #0

## 2016-11-01 NOTE — Telephone Encounter (Signed)
Will address in today's OV

## 2016-11-01 NOTE — Patient Instructions (Addendum)
Kelsey Joyce was seen today for hypertension.  Diagnoses and all orders for this visit:  Severe hypertension -     US Renal Artery Stenosis; Future -     ECHOCARDIOGRAM COMPLETE; Future -     TSH -     COMPLETE METABOLIC PANEL WITH GFR -     amLODipine (NORVASC) 10 MG tablet; Take 1 tablet (10 mg total) by mouth daily.  HYPERTENSION, BENIGN ESSENTIAL -     cloNIDine (CATAPRES) tablet 0.2 mg; Take 2 tablets (0.2 mg total) by mouth once.  Sinus pressure -     fluticasone (FLONASE) 50 MCG/ACT nasal spray; Place 2 sprays into both nostrils daily. -     cetirizine (ZYRTEC) 10 MG tablet; Take 1 tablet (10 mg total) by mouth daily.   F/u  in 10-14 days for hypertension, make appt today   Dr .Armen PickupFunches

## 2016-11-01 NOTE — Assessment & Plan Note (Signed)
Sinus pressure with nasal congestion Add flonase and zyrtec

## 2016-11-01 NOTE — Progress Notes (Signed)
Subjective:  Patient ID: Kelsey Joyce, female    DOB: 16-Feb-1986  Age: 31 y.o. MRN: [id number]  CC: Hypertension   HPI Kelsey Joyce presents for   1. CHRONIC HYPERTENSION: she has been lost to follow up. She has developed headaches in setting of elevated BP. Her BP was low/normal at last OV with urinary frequency on prinzide. She was transitioned to lisinopril 20 mg daily. She has a strong family history of HTN in her mother, father, cousin.   Disease Monitoring  Blood pressure range: not checking   Chest pain: no   Dyspnea: no   Claudication: no   Medication compliance: yes, taking lisinopril.  Medication Side Effects  Lightheadedness: no   Urinary frequency: no   Edema: no    Preventitive Healthcare:  Exercise: no   Diet Pattern: eating regular meals   Salt Restriction: yes   Social History  Substance Use Topics  . Smoking status: Current Some Day Smoker    Packs/day: 0.20    Types: Cigarettes  . Smokeless tobacco: Never Used     Comment: smoking 1-2 cigs per day  . Alcohol use 0.0 oz/week    Outpatient Medications Prior to Visit  Medication Sig Dispense Refill  . diclofenac (VOLTAREN) 75 MG EC tablet Take 1 tablet (75 mg total) by mouth 2 (two) times daily. 20 tablet 0  . ibuprofen (ADVIL,MOTRIN) 200 MG tablet Take 200 mg by mouth every 6 (six) hours as needed for moderate pain.    Kelsey Joyce Kitchen lisinopril (PRINIVIL,ZESTRIL) 20 MG tablet Take 1 tablet (20 mg total) by mouth daily. 90 tablet 3  . metoCLOPramide (REGLAN) 10 MG tablet Take 1 tablet (10 mg total) by mouth 4 (four) times daily. 20 tablet 0   No facility-administered medications prior to visit.     ROS Review of Systems  Constitutional: Negative for chills and fever.  Eyes: Negative for visual disturbance.  Respiratory: Negative for shortness of breath.   Cardiovascular: Negative for chest pain.  Gastrointestinal: Negative for abdominal pain and blood in stool.  Musculoskeletal: Negative for  arthralgias and back pain.  Skin: Negative for rash.  Allergic/Immunologic: Negative for immunocompromised state.  Neurological: Positive for headaches.  Hematological: Negative for adenopathy. Does not bruise/bleed easily.  Psychiatric/Behavioral: Negative for dysphoric mood and suicidal ideas.    Objective:  BP (!) 170/120 (BP Location: Left Arm, Patient Position: Sitting, Cuff Size: Small)   Pulse 86   Temp 97.9 F (36.6 C) (Oral)   Ht 5\' 3"  (1.6 m)   Wt 129 lb 9.6 oz (58.8 kg)   LMP 10/12/2016   SpO2 100%   BMI 22.96 kg/m   BP/Weight 11/01/2016 10/31/2016 10/30/2016  Systolic BP 170 147 150  Diastolic BP 120 112 113  Wt. (Lbs) 129.6 - -  BMI 22.96 - -   Wt Readings from Last 3 Encounters:  11/01/16 129 lb 9.6 oz (58.8 kg)  11/08/15 121 lb (54.9 kg)  08/18/15 121 lb 14.4 oz (55.3 kg)     Physical Exam  Constitutional: She is oriented to person, place, and time. She appears well-developed and well-nourished. No distress.  HENT:  Head: Normocephalic and atraumatic.  Right Ear: Tympanic membrane, external ear and ear canal normal.  Left Ear: Tympanic membrane, external ear and ear canal normal.  Nose: Mucosal edema present.  Cardiovascular: Normal rate, regular rhythm, normal heart sounds and intact distal pulses.   Pulmonary/Chest: Effort normal and breath sounds normal.  Musculoskeletal: She exhibits no edema.  Neurological: She is  alert and oriented to person, place, and time.  Skin: Skin is warm and dry. No rash noted.  Psychiatric: She has a normal mood and affect.   Treated with clonidine 0.2 mg  PO x 1  Repeat BP 150/110  Assessment & Plan:  Roney Jaffeameshia was seen today for hypertension.  Diagnoses and all orders for this visit:  Severe hypertension -     Cancel: US Renal Artery Stenosis; Future -     ECHOCARDIOGRAM COMPLETE; Future -     TSH -     COMPLETE METABOLIC PANEL WITH GFR -     amLODipine (NORVASC) 10 MG tablet; Take 1 tablet (10 mg total) by  mouth daily. -     VAS US RENAL ARTERY DUPLEX; Future  HYPERTENSION, BENIGN ESSENTIAL -     cloNIDine (CATAPRES) tablet 0.2 mg; Take 2 tablets (0.2 mg total) by mouth once.  Sinus pressure -     fluticasone (FLONASE) 50 MCG/ACT nasal spray; Place 2 sprays into both nostrils daily. -     cetirizine (ZYRTEC) 10 MG tablet; Take 1 tablet (10 mg total) by mouth daily.   There are no diagnoses linked to this encounter.  No orders of the defined types were placed in this encounter.   Follow-up: Return in about 2 weeks (around 11/15/2016) for HTN .   Dessa PhiJosalyn Marlinda Miranda MD

## 2016-11-01 NOTE — Progress Notes (Signed)
Pt is here for HTN. Pt states that she would like lab work done today to check her kidneys.

## 2016-11-01 NOTE — Assessment & Plan Note (Addendum)
Severe HTN with lisinopril Med: compliant  Start norvasc 10 mg daily  ECHO and US Renal Artery Stenosis to rule out secondary HTN

## 2016-11-02 ENCOUNTER — Encounter: Payer: Self-pay | Admitting: Family Medicine

## 2016-11-02 ENCOUNTER — Telehealth: Payer: Self-pay

## 2016-11-02 NOTE — Telephone Encounter (Signed)
Patient concern

## 2016-11-02 NOTE — Telephone Encounter (Signed)
Pt was called and informed of lab results and Ultrasound schedule and ECHO.

## 2016-11-07 ENCOUNTER — Inpatient Hospital Stay: Payer: Self-pay

## 2016-11-16 ENCOUNTER — Ambulatory Visit: Payer: Self-pay | Attending: Family Medicine | Admitting: Family Medicine

## 2016-11-16 ENCOUNTER — Encounter: Payer: Self-pay | Admitting: Family Medicine

## 2016-11-16 VITALS — BP 137/94 | HR 92 | Temp 98.3°F | Ht 63.0 in | Wt 131.2 lb

## 2016-11-16 DIAGNOSIS — I10 Essential (primary) hypertension: Secondary | ICD-10-CM

## 2016-11-16 DIAGNOSIS — B001 Herpesviral vesicular dermatitis: Secondary | ICD-10-CM

## 2016-11-16 DIAGNOSIS — Z79899 Other long term (current) drug therapy: Secondary | ICD-10-CM | POA: Insufficient documentation

## 2016-11-16 DIAGNOSIS — F17210 Nicotine dependence, cigarettes, uncomplicated: Secondary | ICD-10-CM | POA: Insufficient documentation

## 2016-11-16 MED ORDER — CHLORTHALIDONE 25 MG PO TABS: 25.0000 mg | ORAL_TABLET | Freq: Every day | ORAL | 11 refills | Status: DC

## 2016-11-16 MED ORDER — AMLODIPINE BESYLATE 10 MG PO TABS: 10.0000 mg | ORAL_TABLET | Freq: Every day | ORAL | 11 refills | Status: DC

## 2016-11-16 MED FILL — ?CHLORTHALIDONE 25 MG TABLE: 25 | 30 days supply | Qty: 30 | Fill #0

## 2016-11-16 NOTE — Assessment & Plan Note (Signed)
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

## 2016-11-16 NOTE — Progress Notes (Signed)
Subjective:  Patient ID: Kelsey Joyce, female    DOB: 12/05/1985  Age: 31 y.o. MRN: [id number]  CC: Hypertension   HPI Kelsey Joyce presents for   1. CHRONIC HYPERTENSION:   Disease Monitoring  Blood pressure range: not checking   Chest pain: no   Dyspnea: no   Claudication: no   Medication compliance: yes, amlodipine 10 mg daily  Medication Side Effects  Lightheadedness: no   Urinary frequency: no   Edema: slight in L foot    Preventitive Healthcare:  Exercise: no   Diet Pattern: eating regular meals   Salt Restriction: yes    2. Mouth sore: for past 3 months. She gets hyperpigmentation, tingling and cracking of skin around mouth on left lower side every month at onset of menstrual period. No blistering. No genital lesions.   Social History  Substance Use Topics  . Smoking status: Current Some Day Smoker    Packs/day: 0.20    Types: Cigarettes  . Smokeless tobacco: Never Used     Comment: smoking 1-2 cigs per day  . Alcohol use 0.0 oz/week    Outpatient Medications Prior to Visit  Medication Sig Dispense Refill  . amLODipine (NORVASC) 10 MG tablet Take 1 tablet (10 mg total) by mouth daily. 30 tablet 5  . cetirizine (ZYRTEC) 10 MG tablet Take 1 tablet (10 mg total) by mouth daily. 30 tablet 11  . fluticasone (FLONASE) 50 MCG/ACT nasal spray Place 2 sprays into both nostrils daily. 16 g 6  . ibuprofen (ADVIL,MOTRIN) 200 MG tablet Take 200 mg by mouth every 6 (six) hours as needed for moderate pain.     No facility-administered medications prior to visit.     ROS Review of Systems  Constitutional: Negative for chills and fever.  Eyes: Negative for visual disturbance.  Respiratory: Negative for shortness of breath.   Cardiovascular: Negative for chest pain.  Gastrointestinal: Negative for abdominal pain and blood in stool.  Musculoskeletal: Negative for arthralgias and back pain.  Skin: Negative for rash.  Allergic/Immunologic: Negative for  immunocompromised state.  Neurological: Negative for headaches.  Hematological: Negative for adenopathy. Does not bruise/bleed easily.  Psychiatric/Behavioral: Negative for dysphoric mood and suicidal ideas.    Objective:  BP (!) 137/94 (BP Location: Left Arm, Patient Position: Sitting, Cuff Size: Small)   Pulse 92   Temp 98.3 F (36.8 C) (Oral)   Ht 5\' 3"  (1.6 m)   Wt 131 lb 3.2 oz (59.5 kg)   LMP 10/11/2016   SpO2 100%   BMI 23.24 kg/m   BP/Weight 11/16/2016 11/01/2016 10/31/2016  Systolic BP 137 150 147  Diastolic BP 94 110 112  Wt. (Lbs) 131.2 129.6 -  BMI 23.24 22.96 -   Wt Readings from Last 3 Encounters:  11/16/16 131 lb 3.2 oz (59.5 kg)  11/01/16 129 lb 9.6 oz (58.8 kg)  11/08/15 121 lb (54.9 kg)   Physical Exam  Constitutional: She is oriented to person, place, and time. She appears well-developed and well-nourished. No distress.  HENT:  Head: Normocephalic and atraumatic.  Right Ear: Tympanic membrane, external ear and ear canal normal.  Left Ear: Tympanic membrane, external ear and ear canal normal.  Nose:    Cardiovascular: Normal rate, regular rhythm, normal heart sounds and intact distal pulses.   Pulmonary/Chest: Effort normal and breath sounds normal.  Musculoskeletal: She exhibits no edema.  Neurological: She is alert and oriented to person, place, and time.  Skin: Skin is warm and dry. No rash noted.  Psychiatric: She has a normal mood and affect.     Assessment & Plan:  Kelsey Joyce was seen today for hypertension.  Diagnoses and all orders for this visit:  HYPERTENSION, BENIGN ESSENTIAL -     chlorthalidone (HYGROTON) 25 MG tablet; Take 1 tablet (25 mg total) by mouth daily.  Cold sore -     HSV(herpes smplx)abs-1+2(IgG+IgM)-bld  Severe hypertension -     amLODipine (NORVASC) 10 MG tablet; Take 1 tablet (10 mg total) by mouth daily.   There are no diagnoses linked to this encounter.  No orders of the defined types were placed in this  encounter.   Follow-up: Return in about 3 weeks (around 12/07/2016) for BP check .   Dessa Phi MD

## 2016-11-16 NOTE — Patient Instructions (Addendum)
Kelsey Joyce was seen today for hypertension.  Diagnoses and all orders for this visit:  HYPERTENSION, BENIGN ESSENTIAL -     chlorthalidone (HYGROTON) 25 MG tablet; Take 1 tablet (25 mg total) by mouth daily.  Cold sore -     HSV(herpes smplx)abs-1+2(IgG+IgM)-bld   BP is much improved Continue norvasc 10 mg daily Add chlorthalidone 25 mg in AM  Continue to work on smoking cessation  You will be called with lab results  F/u in 2-3 weeks for BP check with clinical pharmacologist  F/u with me in 3 months for HTN   Dr. Armen PickupFunches

## 2016-11-16 NOTE — Assessment & Plan Note (Signed)
A: improved, BP reduced, headaches resolved  Med: compliant, tolerating norvasc with just slight swelling in L foot  P: Continue norvasc 10 mg daily, Add chlorthalidone 25 mg daily Smoking

## 2016-11-16 NOTE — Addendum Note (Signed)
Addended by: Dessa PhiFUNCHES, Mitesh Rosendahl on: 11/16/2016 04:10 PM   Modules accepted: Orders

## 2016-11-19 ENCOUNTER — Inpatient Hospital Stay (HOSPITAL_COMMUNITY): Admission: RE | Admit: 2016-11-19 | Payer: Self-pay | Source: Ambulatory Visit

## 2016-11-19 ENCOUNTER — Other Ambulatory Visit (HOSPITAL_COMMUNITY): Payer: Self-pay

## 2016-11-19 ENCOUNTER — Ambulatory Visit (HOSPITAL_BASED_OUTPATIENT_CLINIC_OR_DEPARTMENT_OTHER): Payer: Self-pay

## 2016-11-19 ENCOUNTER — Other Ambulatory Visit: Payer: Self-pay

## 2016-11-19 ENCOUNTER — Ambulatory Visit (HOSPITAL_COMMUNITY)
Admission: RE | Admit: 2016-11-19 | Discharge: 2016-11-19 | Disposition: A | Discharge status: Home / Self Care | Payer: Self-pay | Source: Ambulatory Visit | Attending: Family Medicine | Admitting: Family Medicine

## 2016-11-19 DIAGNOSIS — I10 Essential (primary) hypertension: Secondary | ICD-10-CM

## 2016-11-19 DIAGNOSIS — Z720 Tobacco use: Secondary | ICD-10-CM | POA: Insufficient documentation

## 2016-11-20 ENCOUNTER — Encounter (HOSPITAL_COMMUNITY): Payer: Self-pay

## 2016-11-20 LAB — HSV 1/2 AB (IGM), IFA W/RFLX TITER
HSV 1 IgM Screen: NEGATIVE
HSV 2 IgM Screen: NEGATIVE

## 2016-11-20 NOTE — Progress Notes (Signed)
Visceral duplex performed on 11/19/16 showed normal renal arteries, bilaterally.

## 2016-12-03 MED FILL — AMLODIPINE BESYLATE 10 MG T: 10 | 30 days supply | Qty: 30 | Fill #1

## 2016-12-03 MED FILL — FLUTICASONE PROP 50 MCG SPR: 50 | 30 days supply | Qty: 16 | Fill #1

## 2016-12-03 MED FILL — ?CETIRIZINE HCL 10 MG TABLE: 10 | 30 days supply | Qty: 30 | Fill #1

## 2016-12-04 ENCOUNTER — Ambulatory Visit: Payer: Self-pay | Attending: Family Medicine | Admitting: Pharmacist

## 2016-12-04 ENCOUNTER — Encounter: Payer: Self-pay | Admitting: Pharmacist

## 2016-12-04 VITALS — BP 120/83 | HR 94

## 2016-12-04 DIAGNOSIS — I10 Essential (primary) hypertension: Secondary | ICD-10-CM

## 2016-12-04 NOTE — Patient Instructions (Addendum)
Thanks for coming to see Kelsey Joyce!  Continue your medications as prescribed  Follow up with Dr. Armen PickupFunches   DASH Eating Plan DASH stands for "Dietary Approaches to Stop Hypertension." The DASH eating plan is a healthy eating plan that has been shown to reduce high blood pressure (hypertension). It may also reduce your risk for type 2 diabetes, heart disease, and stroke. The DASH eating plan may also help with weight loss. What are tips for following this plan? General guidelines   Avoid eating more than 2,300 mg (milligrams) of salt (sodium) a day. If you have hypertension, you may need to reduce your sodium intake to 1,500 mg a day.  Limit alcohol intake to no more than 1 drink a day for nonpregnant women and 2 drinks a day for men. One drink equals 12 oz of beer, 5 oz of wine, or 1 oz of hard liquor.  Work with your health care provider to maintain a healthy body weight or to lose weight. Ask what an ideal weight is for you.  Get at least 30 minutes of exercise that causes your heart to beat faster (aerobic exercise) most days of the week. Activities may include walking, swimming, or biking.  Work with your health care provider or diet and nutrition specialist (dietitian) to adjust your eating plan to your individual calorie needs. Reading food labels   Check food labels for the amount of sodium per serving. Choose foods with less than 5 percent of the Daily Value of sodium. Generally, foods with less than 300 mg of sodium per serving fit into this eating plan.  To find whole grains, look for the word "whole" as the first word in the ingredient list. Shopping   Buy products labeled as "low-sodium" or "no salt added."  Buy fresh foods. Avoid canned foods and premade or frozen meals. Cooking   Avoid adding salt when cooking. Use salt-free seasonings or herbs instead of table salt or sea salt. Check with your health care provider or pharmacist before using salt substitutes.  Do not fry  foods. Cook foods using healthy methods such as baking, boiling, grilling, and broiling instead.  Cook with heart-healthy oils, such as olive, canola, soybean, or sunflower oil. Meal planning    Eat a balanced diet that includes:  5 or more servings of fruits and vegetables each day. At each meal, try to fill half of your plate with fruits and vegetables.  Up to 6-8 servings of whole grains each day.  Less than 6 oz of lean meat, poultry, or fish each day. A 3-oz serving of meat is about the same size as a deck of cards. One egg equals 1 oz.  2 servings of low-fat dairy each day.  A serving of nuts, seeds, or beans 5 times each week.  Heart-healthy fats. Healthy fats called Omega-3 fatty acids are found in foods such as flaxseeds and coldwater fish, like sardines, salmon, and mackerel.  Limit how much you eat of the following:  Canned or prepackaged foods.  Food that is high in trans fat, such as fried foods.  Food that is high in saturated fat, such as fatty meat.  Sweets, desserts, sugary drinks, and other foods with added sugar.  Full-fat dairy products.  Do not salt foods before eating.  Try to eat at least 2 vegetarian meals each week.  Eat more home-cooked food and less restaurant, buffet, and fast food.  When eating at a restaurant, ask that your food be prepared with less  salt or no salt, if possible. What foods are recommended? The items listed may not be a complete list. Talk with your dietitian about what dietary choices are best for you. Grains  Whole-grain or whole-wheat bread. Whole-grain or whole-wheat pasta. Brown rice. Modena Morrow. Bulgur. Whole-grain and low-sodium cereals. Pita bread. Low-fat, low-sodium crackers. Whole-wheat flour tortillas. Vegetables  Fresh or frozen vegetables (raw, steamed, roasted, or grilled). Low-sodium or reduced-sodium tomato and vegetable juice. Low-sodium or reduced-sodium tomato sauce and tomato paste. Low-sodium or  reduced-sodium canned vegetables. Fruits  All fresh, dried, or frozen fruit. Canned fruit in natural juice (without added sugar). Meat and other protein foods  Skinless chicken or [place]. Ground chicken or [place]. Pork with fat trimmed off. Fish and seafood. Egg whites. Dried beans, peas, or lentils. Unsalted nuts, nut butters, and seeds. Unsalted canned beans. Lean cuts of beef with fat trimmed off. Low-sodium, lean deli meat. Dairy  Low-fat (1%) or fat-free (skim) milk. Fat-free, low-fat, or reduced-fat cheeses. Nonfat, low-sodium ricotta or cottage cheese. Low-fat or nonfat yogurt. Low-fat, low-sodium cheese. Fats and oils  Soft margarine without trans fats. Vegetable oil. Low-fat, reduced-fat, or light mayonnaise and salad dressings (reduced-sodium). Canola, safflower, olive, soybean, and sunflower oils. Avocado. Seasoning and other foods  Herbs. Spices. Seasoning mixes without salt. Unsalted popcorn and pretzels. Fat-free sweets. What foods are not recommended? The items listed may not be a complete list. Talk with your dietitian about what dietary choices are best for you. Grains  Baked goods made with fat, such as croissants, muffins, or some breads. Dry pasta or rice meal packs. Vegetables  Creamed or fried vegetables. Vegetables in a cheese sauce. Regular canned vegetables (not low-sodium or reduced-sodium). Regular canned tomato sauce and paste (not low-sodium or reduced-sodium). Regular tomato and vegetable juice (not low-sodium or reduced-sodium). Angie Fava. Olives. Fruits  Canned fruit in a light or heavy syrup. Fried fruit. Fruit in cream or butter sauce. Meat and other protein foods  Fatty cuts of meat. Ribs. Fried meat. Berniece Salines. Sausage. Bologna and other processed lunch meats. Salami. Fatback. Hotdogs. Bratwurst. Salted nuts and seeds. Canned beans with added salt. Canned or smoked fish. Whole eggs or egg yolks. Chicken or [place] with skin. Dairy  Whole or 2% milk, cream, and  half-and-half. Whole or full-fat cream cheese. Whole-fat or sweetened yogurt. Full-fat cheese. Nondairy creamers. Whipped toppings. Processed cheese and cheese spreads. Fats and oils  Butter. Stick margarine. Lard. Shortening. Ghee. Bacon fat. Tropical oils, such as coconut, palm kernel, or palm oil. Seasoning and other foods  Salted popcorn and pretzels. Onion salt, garlic salt, seasoned salt, table salt, and sea salt. Worcestershire sauce. Tartar sauce. Barbecue sauce. Teriyaki sauce. Soy sauce, including reduced-sodium. Steak sauce. Canned and packaged gravies. Fish sauce. Oyster sauce. Cocktail sauce. Horseradish that you find on the shelf. Ketchup. Mustard. Meat flavorings and tenderizers. Bouillon cubes. Hot sauce and Tabasco sauce. Premade or packaged marinades. Premade or packaged taco seasonings. Relishes. Regular salad dressings. Where to find more information:  National Heart, Lung, and [place]: https://wilson-eaton.com/  American Heart Association: www.heart.org Summary  The DASH eating plan is a healthy eating plan that has been shown to reduce high blood pressure (hypertension). It may also reduce your risk for type 2 diabetes, heart disease, and stroke.  With the DASH eating plan, you should limit salt (sodium) intake to 2,300 mg a day. If you have hypertension, you may need to reduce your sodium intake to 1,500 mg a day.  When on the DASH eating  plan, aim to eat more fresh fruits and vegetables, whole grains, lean proteins, low-fat dairy, and heart-healthy fats.  Work with your health care provider or diet and nutrition specialist (dietitian) to adjust your eating plan to your individual calorie needs. This information is not intended to replace advice given to you by your health care provider. Make sure you discuss any questions you have with your health care provider. Document Released: 09/13/2011 Document Revised: 09/17/2016 Document Reviewed: 09/17/2016 Elsevier Interactive  Patient Education  2017 [employer].

## 2016-12-04 NOTE — Progress Notes (Signed)
   S:    Patient arrives in good spirits.    Presents to the clinic for hypertension evaluation. Patient was referred on 11/16/16 by Dr. Armen PickupFunches.  Patient was last seen by Primary Care Provider on 11/16/16.   Patient reports adherence with medications.  Current BP Medications include:  Amlodipine 10 mg daily and chlorthalidone 25 mg daily.   Dietary habits include: working very hard to cut out salt.   O:   Last 3 Office BP readings: BP Readings from Last 3 Encounters:  12/04/16 120/83  11/16/16 (!) 137/94  11/01/16 (!) 150/110    BMET    Component Value Date/Time   NA 139 11/01/2016 0924   K 4.4 11/01/2016 0924   CL 105 11/01/2016 0924   CO2 25 11/01/2016 0924   GLUCOSE 88 11/01/2016 0924   BUN 10 11/01/2016 0924   CREATININE 0.78 11/01/2016 0924   CALCIUM 9.0 11/01/2016 0924   GFRNONAA >89 11/01/2016 0924   GFRAA >89 11/01/2016 0924    A/P: Hypertension longstanding currently controlled on current medications.  Continued amlodipine and chlorthalidone at current doses. Congratulated her on reaching her blood pressure goal and encouraged her to keep up the good work.  Results reviewed and written information provided.   Total time in face-to-face counseling 15 minutes.   F/U Clinic Visit with Dr. Armen PickupFunches.  Patient seen with Mable FillAmy Dorszynski, PharmD Candidate

## 2016-12-17 ENCOUNTER — Encounter: Payer: Self-pay | Admitting: Family Medicine

## 2016-12-18 MED FILL — ?CHLORTHALIDONE 25 MG TABLE: 25 | 30 days supply | Qty: 30 | Fill #1

## 2017-01-02 MED FILL — FLUTICASONE PROP 50 MCG SPR: 50 | 30 days supply | Qty: 16 | Fill #2

## 2017-01-02 MED FILL — AMLODIPINE BESYLATE 10 MG T: 10 | 90 days supply | Qty: 90 | Fill #2

## 2017-01-14 ENCOUNTER — Ambulatory Visit: Payer: Self-pay | Attending: Family Medicine | Admitting: Family Medicine

## 2017-01-14 ENCOUNTER — Encounter: Payer: Self-pay | Admitting: Family Medicine

## 2017-01-14 VITALS — BP 128/83 | HR 93 | Temp 98.3°F | Ht 63.0 in | Wt 132.2 lb

## 2017-01-14 DIAGNOSIS — Z01419 Encounter for gynecological examination (general) (routine) without abnormal findings: Secondary | ICD-10-CM | POA: Insufficient documentation

## 2017-01-14 DIAGNOSIS — Z113 Encounter for screening for infections with a predominantly sexual mode of transmission: Secondary | ICD-10-CM

## 2017-01-14 DIAGNOSIS — N760 Acute vaginitis: Secondary | ICD-10-CM

## 2017-01-14 DIAGNOSIS — K130 Diseases of lips: Secondary | ICD-10-CM

## 2017-01-14 DIAGNOSIS — F17210 Nicotine dependence, cigarettes, uncomplicated: Secondary | ICD-10-CM | POA: Insufficient documentation

## 2017-01-14 DIAGNOSIS — B9689 Other specified bacterial agents as the cause of diseases classified elsewhere: Secondary | ICD-10-CM

## 2017-01-14 DIAGNOSIS — Z124 Encounter for screening for malignant neoplasm of cervix: Secondary | ICD-10-CM

## 2017-01-14 DIAGNOSIS — Z79899 Other long term (current) drug therapy: Secondary | ICD-10-CM | POA: Insufficient documentation

## 2017-01-14 DIAGNOSIS — B001 Herpesviral vesicular dermatitis: Secondary | ICD-10-CM

## 2017-01-14 DIAGNOSIS — R87612 Low grade squamous intraepithelial lesion on cytologic smear of cervix (LGSIL): Secondary | ICD-10-CM

## 2017-01-14 MED ORDER — ACYCLOVIR 400 MG PO TABS: 400.0000 mg | ORAL_TABLET | Freq: Three times a day (TID) | ORAL | 0 refills | Status: DC

## 2017-01-14 MED FILL — ACYCLOVIR 400 MG TABLET: 400 | 5 days supply | Qty: 15 | Fill #0

## 2017-01-14 NOTE — Patient Instructions (Signed)
Aolani was seen today for annual exam.  Diagnoses and all orders for this visit:  Pap smear for cervical cancer screening -     Cytology - PAP  Routine screening for STI (sexually transmitted infection) -     HIV antibody (with reflex) -     RPR  Lip lesion -     acyclovir (ZOVIRAX) 400 MG tablet; Take 1 tablet (400 mg total) by mouth 3 (three) times daily. For 5 days   f/u in 3 months for hypertension   Dr. Armen Pickup

## 2017-01-14 NOTE — Progress Notes (Signed)
SUBJECTIVE:  31 y.o. female for annual routine check up and pap.  She has ASCUS pap with normal f/u colposcopy in 07/2015. She recommendation was for repeat pap smear in one year. She reports scant discharge. She reports cracking and dryness of her lower lip on the left side that occurs every month during her menstrual period. HSV 1 and 2 IgM were negative.   Social History  Substance Use Topics  . Smoking status: Current Some Day Smoker    Packs/day: 0.20    Types: Cigarettes  . Smokeless tobacco: Never Used     Comment: smoking 1-2 cigs per day  . Alcohol use 0.0 oz/week   Current Outpatient Prescriptions  Medication Sig Dispense Refill  . amLODipine (NORVASC) 10 MG tablet Take 1 tablet (10 mg total) by mouth daily. 30 tablet 11  . cetirizine (ZYRTEC) 10 MG tablet Take 1 tablet (10 mg total) by mouth daily. 30 tablet 11  . chlorthalidone (HYGROTON) 25 MG tablet Take 1 tablet (25 mg total) by mouth daily. 30 tablet 11  . fluticasone (FLONASE) 50 MCG/ACT nasal spray Place 2 sprays into both nostrils daily. 16 g 6  . ibuprofen (ADVIL,MOTRIN) 200 MG tablet Take 200 mg by mouth every 6 (six) hours as needed for moderate pain.     No current facility-administered medications for this visit.    Allergies: Acetaminophen  Patient's last menstrual period was 01/09/2017.  ROS:  Feeling well. No dyspnea or chest pain on exertion.  No abdominal pain, change in bowel habits, black or bloody stools.  No urinary tract symptoms. GYN ROS: normal menses, no abnormal bleeding, pelvic pain or discharge, no breast pain or new or enlarging lumps on self exam. No neurological complaints.  OBJECTIVE:  The patient appears well, alert, oriented x 3, in no distress. BP 128/83   Pulse 93   Temp 98.3 F (36.8 C) (Oral)   Ht  (1.6 m)   Wt 132 lb 3.2 oz (60 kg)   LMP 01/09/2017   SpO2 100%   BMI 23.42 kg/m  ENT normal.  Neck supple. No adenopathy or thyromegaly. PERLA. Lungs are clear, good air  entry, no wheezes, rhonchi or rales. S1 and S2 normal, no murmurs, regular rate and rhythm. Abdomen soft without tenderness, guarding, mass or organomegaly. Extremities show no edema, normal peripheral pulses. Neurological is normal, no focal findings.  BREAST EXAM: breasts appear normal, no suspicious masses, no skin or nipple changes or axillary nodes  PELVIC EXAM: normal external genitalia, vulva, vagina, cervix, uterus and adnexa  ASSESSMENT:  well woman  PLAN:  pap smear

## 2017-01-14 NOTE — Assessment & Plan Note (Signed)
Course of acyclovir for monthly symptoms consistent with cold sore

## 2017-01-15 LAB — RPR: RPR Ser Ql: NONREACTIVE

## 2017-01-15 LAB — CERVICOVAGINAL ANCILLARY ONLY
Chlamydia: NEGATIVE
Neisseria Gonorrhea: NEGATIVE
Wet Prep (BD Affirm): POSITIVE — AB

## 2017-01-15 LAB — HIV ANTIBODY (ROUTINE TESTING W REFLEX): HIV Screen 4th Generation wRfx: NONREACTIVE

## 2017-01-16 ENCOUNTER — Telehealth: Payer: Self-pay

## 2017-01-16 NOTE — Telephone Encounter (Signed)
Pt was called and informed of lab results. 

## 2017-01-17 LAB — CYTOLOGY - PAP: HPV: DETECTED — AB

## 2017-01-17 MED ORDER — METRONIDAZOLE 500 MG PO TABS: 500.0000 mg | ORAL_TABLET | Freq: Two times a day (BID) | ORAL | 0 refills | Status: AC

## 2017-01-17 MED ORDER — FLUCONAZOLE 150 MG PO TABS: 150.0000 mg | ORAL_TABLET | Freq: Once | ORAL | 0 refills | Status: AC

## 2017-01-17 MED FILL — ?METRONIDAZOLE 500 MG TABLE: 500 | 7 days supply | Qty: 14 | Fill #0

## 2017-01-17 MED FILL — FLUCONAZOLE 150 MG TABLET: 150 | 1 days supply | Qty: 1 | Fill #0

## 2017-01-17 NOTE — Addendum Note (Signed)
Addended by: Dessa Phi on: 01/17/2017 03:41 PM   Modules accepted: Orders

## 2017-01-18 ENCOUNTER — Telehealth: Payer: Self-pay

## 2017-01-18 NOTE — Telephone Encounter (Signed)
Pt was called and a VM was left informing pt to reurn phone call for lab results.

## 2017-01-23 NOTE — Assessment & Plan Note (Signed)
LGSIL pap, HPV positive

## 2017-01-23 NOTE — Addendum Note (Signed)
Addended by: Dessa Phi on: 01/23/2017 10:55 PM   Modules accepted: Orders

## 2017-01-28 ENCOUNTER — Encounter: Payer: Self-pay | Admitting: Obstetrics and Gynecology

## 2017-01-30 ENCOUNTER — Telehealth: Payer: Self-pay

## 2017-01-30 NOTE — Telephone Encounter (Signed)
Pt was called and informed of lab results and referral being placed. 

## 2017-02-11 MED FILL — FLUTICASONE PROP 50 MCG SPR: 50 | 30 days supply | Qty: 16 | Fill #3

## 2017-02-14 MED FILL — ?CHLORTHALIDONE 25 MG TABLE: 25 | 30 days supply | Qty: 30 | Fill #2

## 2017-02-15 ENCOUNTER — Telehealth: Payer: Self-pay

## 2017-02-15 NOTE — Telephone Encounter (Signed)
Pt came into office and had questions about Pap results. Pt would like a call to futer discuss results. Pt has appointment on 5/15 at 10:30.. Please follow up

## 2017-02-15 NOTE — Telephone Encounter (Signed)
Please advise her to keep her appointment so her questions can be addressed.

## 2017-02-19 ENCOUNTER — Ambulatory Visit: Payer: Self-pay | Admitting: Obstetrics and Gynecology

## 2017-02-19 ENCOUNTER — Telehealth: Payer: Self-pay | Admitting: Family Medicine

## 2017-02-19 ENCOUNTER — Encounter: Payer: Self-pay | Admitting: Family Medicine

## 2017-02-19 ENCOUNTER — Ambulatory Visit: Payer: Self-pay | Attending: Family Medicine | Admitting: Family Medicine

## 2017-02-19 VITALS — BP 111/75 | HR 91 | Temp 97.9°F | Ht 63.0 in | Wt 131.0 lb

## 2017-02-19 DIAGNOSIS — I10 Essential (primary) hypertension: Secondary | ICD-10-CM | POA: Insufficient documentation

## 2017-02-19 DIAGNOSIS — F17210 Nicotine dependence, cigarettes, uncomplicated: Secondary | ICD-10-CM | POA: Insufficient documentation

## 2017-02-19 DIAGNOSIS — R87612 Low grade squamous intraepithelial lesion on cytologic smear of cervix (LGSIL): Secondary | ICD-10-CM

## 2017-02-19 DIAGNOSIS — Z79899 Other long term (current) drug therapy: Secondary | ICD-10-CM | POA: Insufficient documentation

## 2017-02-19 DIAGNOSIS — F17200 Nicotine dependence, unspecified, uncomplicated: Secondary | ICD-10-CM

## 2017-02-19 NOTE — Progress Notes (Signed)
Subjective:  Patient ID: Kelsey Joyce, female    DOB: March 08, 1986  Age: 31 y.o. MRN: [id number]  CC: Follow-up   HPI Kelsey Joyce has HTN and is a smoker she presents with her mother for   1. Abnormal pap smear: her last pap smear was done on 01/14/2017. She has HPV positive LSIL pap smear. She has been referred to gynecology for f/u colposcopy. Her previous pap smear was HPV positive ASCUS on 07/29/2015. She completed colposcopy evaluation on 08/18/2015 that was normal. There was no biopsy done. Her 2011 and 2012 pap smears were normal. She reports consistent use of condoms. She reports low number of sexual partners.    She reports worrying about her health. She and her mother reports strong history of breast cancer on her mother's side. She reports being told that she had cervical cancer when her colposcopy was scheduled which caused a lot of worry.   Social History  Substance Use Topics  . Smoking status: Current Some Day Smoker    Packs/day: 0.20    Types: Cigarettes  . Smokeless tobacco: Never Used     Comment: smoking 1-2 cigs per day  . Alcohol use 0.0 oz/week   Family History  Problem Relation Age of Onset  . Hypertension Mother   . Cervical cancer Unknown 60       Grandmother  . Hypertension Paternal Aunt   . Breast cancer Unknown 39       Aunt  . Breast cancer Unknown        Great Aunt     Outpatient Medications Prior to Visit  Medication Sig Dispense Refill  . acyclovir (ZOVIRAX) 400 MG tablet Take 1 tablet (400 mg total) by mouth 3 (three) times daily. For 5 days 15 tablet 0  . amLODipine (NORVASC) 10 MG tablet Take 1 tablet (10 mg total) by mouth daily. 30 tablet 11  . cetirizine (ZYRTEC) 10 MG tablet Take 1 tablet (10 mg total) by mouth daily. 30 tablet 11  . chlorthalidone (HYGROTON) 25 MG tablet Take 1 tablet (25 mg total) by mouth daily. 30 tablet 11  . fluticasone (FLONASE) 50 MCG/ACT nasal spray Place 2 sprays into both nostrils daily. 16 g 6  .  ibuprofen (ADVIL,MOTRIN) 200 MG tablet Take 200 mg by mouth every 6 (six) hours as needed for moderate pain.     No facility-administered medications prior to visit.     ROS Review of Systems  Constitutional: Negative for chills and fever.  Eyes: Negative for visual disturbance.  Respiratory: Negative for shortness of breath.   Cardiovascular: Negative for chest pain.  Gastrointestinal: Negative for abdominal pain and blood in stool.  Musculoskeletal: Negative for arthralgias and back pain.  Skin: Negative for rash.  Allergic/Immunologic: Negative for immunocompromised state.  Hematological: Negative for adenopathy. Does not bruise/bleed easily.  Psychiatric/Behavioral: Negative for dysphoric mood and suicidal ideas. The patient is nervous/anxious.     Objective:  BP 111/75   Pulse 91   Temp 97.9 F (36.6 C) (Oral)   Ht 5\' 3"  (1.6 m)   Wt 131 lb (59.4 kg)   SpO2 100%   BMI 23.21 kg/m   BP/Weight 02/19/2017 01/14/2017 12/04/2016  Systolic BP 111 128 120  Diastolic BP 75 83 83  Wt. (Lbs) 131 132.2 -  BMI 23.21 23.42 -   Wt Readings from Last 3 Encounters:  02/19/17 131 lb (59.4 kg)  01/14/17 132 lb 3.2 oz (60 kg)  11/16/16 131 lb 3.2 oz (59.5 kg)  Physical Exam  Constitutional: She is oriented to person, place, and time. She appears well-developed and well-nourished. No distress.  HENT:  Head: Normocephalic and atraumatic.  Cardiovascular: Normal rate, regular rhythm, normal heart sounds and intact distal pulses.   Pulmonary/Chest: Effort normal and breath sounds normal.  Musculoskeletal: She exhibits no edema.  Neurological: She is alert and oriented to person, place, and time.  Skin: Skin is warm and dry. No rash noted.  Psychiatric: She has a normal mood and affect.     Assessment & Plan:  Kelsey Joyce was seen today for follow-up.  Diagnoses and all orders for this visit:  LGSIL on Pap smear of cervix  Smoking   There are no diagnoses linked to this  encounter.  No orders of the defined types were placed in this encounter.   Follow-up: Return in about 6 months (around 08/22/2017) for HTN.   Dessa PhiJosalyn Laurette Villescas MD

## 2017-02-19 NOTE — Telephone Encounter (Signed)
Will route to PCP 

## 2017-02-19 NOTE — Telephone Encounter (Signed)
Pt wanted to call and advise Dr Armen PickupFunches that she went to her appt for her procedure and facility did not perform procedure due to pt not having insurance. Pt requests that Dr Armen PickupFunches call today to advise of next options. Please f/u with pt. Thank you.

## 2017-02-19 NOTE — Assessment & Plan Note (Signed)
Pap results and colposcopy procedure discussed with patient Reassurance given Smoking cessation advised

## 2017-02-19 NOTE — Patient Instructions (Addendum)
Roney Jaffeameshia was seen today for follow-up.  Diagnoses and all orders for this visit:  LGSIL on Pap smear of cervix  Smoking   Your pap is LSIL this is a low grade lesion/change that is not cancer The colposcopy today will look for any changes concerning for cancer potential. If seen the areas will be biopsied. The ares will appear white after the vinegar is added.   Work to quit smoking. I recommended the nicotine patch.  Smoking cessation support: smoking cessation hotline: 1-800-QUIT-NOW.  Smoking cessation classes are available through [facility]. Call [phone] or visit our website at [email or web address].  Patch 14 mg patch for 6 weeks, then  Patch 7 mg for 2 weeks   [employer] or [employer] brand works just as well.   F/u in 6 months sooner if needed   Dr. Armen PickupFunches

## 2017-02-19 NOTE — Progress Notes (Signed)
Pt is here today to discuss pap results. Pt states that she has questions for u regarding her pap results.

## 2017-02-19 NOTE — Telephone Encounter (Signed)
Called back to patient Verified name and DOB Patient uninsured and could not complete colposcopy today  Plan: Sent referral to family medicine for colposcopy clinic, this is where she completed colposcopy in 2016.  Called [place], [phone],  with the BCCCP (breast and cervical cancer control program) at the [facility] to inquire about getting f/u colposcopy done for patient.

## 2017-02-20 ENCOUNTER — Encounter: Payer: Self-pay | Admitting: Family Medicine

## 2017-02-20 NOTE — Addendum Note (Signed)
Addended by: Dessa PhiFUNCHES, Octavius Shin on: 02/20/2017 03:04 PM   Modules accepted: Orders

## 2017-02-20 NOTE — Telephone Encounter (Signed)
Good Morning,  I will call the patient to get her schedule for a colposcopy. She can see us in our clinic and then [facility]. This will be free for her if she qualifies.  Thanks, Hughes SupplySabrina Holland

## 2017-02-20 NOTE — Telephone Encounter (Signed)
That is wonderful. Thank you.   Dr. Armen PickupFunches

## 2017-02-20 NOTE — Telephone Encounter (Signed)
Thank You for letting me know

## 2017-03-13 ENCOUNTER — Other Ambulatory Visit: Payer: Self-pay | Admitting: Family Medicine

## 2017-03-13 DIAGNOSIS — I10 Essential (primary) hypertension: Secondary | ICD-10-CM

## 2017-03-13 MED FILL — AMLODIPINE BESYLATE 10 MG T: 10 | 30 days supply | Qty: 30 | Fill #3

## 2017-03-26 MED FILL — ?CHLORTHALIDONE 25 MG TABLE: 25 | 30 days supply | Qty: 30 | Fill #3

## 2017-04-02 ENCOUNTER — Encounter (HOSPITAL_COMMUNITY): Payer: Self-pay

## 2017-04-02 ENCOUNTER — Ambulatory Visit (HOSPITAL_COMMUNITY)
Admission: RE | Admit: 2017-04-02 | Discharge: 2017-04-02 | Disposition: A | Discharge status: Home / Self Care | Payer: Self-pay | Source: Ambulatory Visit | Attending: Obstetrics and Gynecology | Admitting: Obstetrics and Gynecology

## 2017-04-02 VITALS — BP 151/95 | Ht 63.0 in | Wt 132.0 lb

## 2017-04-02 DIAGNOSIS — R87612 Low grade squamous intraepithelial lesion on cytologic smear of cervix (LGSIL): Secondary | ICD-10-CM

## 2017-04-02 DIAGNOSIS — Z1239 Encounter for other screening for malignant neoplasm of breast: Secondary | ICD-10-CM

## 2017-04-02 NOTE — Progress Notes (Signed)
[facility] due to having an abnormal Pap smear on 01/14/2017 that a colposcopy is recommended for follow up.  Pap Smear: Pap smear not completed today. Last Pap [facility]9/20418 [facility] and LSIL. Referred patient to the [facility] for a [facility]ps[facility]. Appointment scheduled for Tuesday, April 16, 2017 at 1500. Per patient has a history of one other abnormal Pap smear in 2017 that a repeat Pap smear was completed for follow up. Last Pap smear result is in EPIC.  Physical exam: Breasts Breasts symmetrical. No skin abnormalities bilateral breasts. No nipple retraction bilateral breasts. No nipple discharge bilateral breasts. No lymphadenopathy. No lumps palpated bilateral breasts. No complaints of pain or tenderness on exam. Screening mammogram recommended at age 31 unless clinically indicated prior.      Pelvic/Bimanual No Pap smear completed today since last Pap smear was 01/14/2017. Pap smear not indicated per BCCCP guidelines.   Smoking History: Patient is a current smoker that is currently working on quitting. Discussed smoking cessation with patient. She has already [facility] the [facility]. Patient stated she has received free nicotine patches and gum. She states she is using them both and that they are helping.  Patient Navigation: Patient education provided. Access to services provided for patient through [facility].

## 2017-04-02 NOTE — Patient Instructions (Signed)
Explained breast self awareness with Kelsey Joyce. Patient did not need a Pap smear today due to last Pap smear was 01/14/2017. Explained the colposcopy the recommended follow up for her abnormal Pap smear. [facility] for a colps[facility]. Appointment scheduled for Tuesday, April 16, 2017 at 1500. Patient aware of appointment and will be there. Let patient know she will need a screening mammogram at age 31 unless clinically indicated prior. Kelsey Joyce verbalized understanding.  [name], Kathaleen Maserhristine Poll, RN 11:26 AM

## 2017-04-03 ENCOUNTER — Encounter (HOSPITAL_COMMUNITY): Payer: Self-pay | Admitting: *Deleted

## 2017-04-16 ENCOUNTER — Other Ambulatory Visit (HOSPITAL_COMMUNITY)
Admission: RE | Admit: 2017-04-16 | Discharge: 2017-04-16 | Disposition: A | Discharge status: Home / Self Care | Payer: Medicaid Other | Source: Ambulatory Visit | Attending: Obstetrics & Gynecology | Admitting: Obstetrics & Gynecology

## 2017-04-16 ENCOUNTER — Encounter: Payer: Self-pay | Admitting: Obstetrics & Gynecology

## 2017-04-16 ENCOUNTER — Ambulatory Visit (INDEPENDENT_AMBULATORY_CARE_PROVIDER_SITE_OTHER): Payer: Self-pay | Admitting: Obstetrics & Gynecology

## 2017-04-16 VITALS — BP 120/80 | HR 77 | Ht 63.0 in | Wt 129.9 lb

## 2017-04-16 DIAGNOSIS — N871 Moderate cervical dysplasia: Secondary | ICD-10-CM | POA: Diagnosis present

## 2017-04-16 DIAGNOSIS — Z3202 Encounter for pregnancy test, result negative: Secondary | ICD-10-CM | POA: Insufficient documentation

## 2017-04-16 DIAGNOSIS — R87612 Low grade squamous intraepithelial lesion on cytologic smear of cervix (LGSIL): Secondary | ICD-10-CM

## 2017-04-16 NOTE — Progress Notes (Signed)
Patient given informed consent, signed copy in the chart, time out was performed.  Placed in lithotomy position. Cervix viewed with speculum and colposcope after application of acetic acid.  01/23/2017 LGSIL with +hrHPV  Colposcopy adequate?  yes Acetowhite lesions? Yes at 3 o'clock Punctation? no Mosaicism?  no Abnormal vasculature?  no Biopsies? yes ECC? yes   Patient was given post procedure instructions.  She will return in 2 weeks for results.  Torez Beauregard L. Harraway-Smith, M.D., Evern CoreFACOG

## 2017-04-16 NOTE — Patient Instructions (Signed)

## 2017-04-17 LAB — POCT PREGNANCY, URINE: Preg Test, Ur: NEGATIVE

## 2017-04-18 MED FILL — FLUTICASONE PROP 50 MCG SPR: 50 | 30 days supply | Qty: 16 | Fill #4

## 2017-04-18 MED FILL — ?CETIRIZINE HCL 10 MG TABLE: 10 | 30 days supply | Qty: 30 | Fill #2

## 2017-04-18 MED FILL — ?AMLODIPINE BESYLATE 10 MG: 10 | 30 days supply | Qty: 30 | Fill #0

## 2017-04-22 ENCOUNTER — Ambulatory Visit: Payer: Medicaid Other | Attending: Family Medicine | Admitting: Family Medicine

## 2017-04-22 ENCOUNTER — Encounter: Payer: Self-pay | Admitting: Family Medicine

## 2017-04-22 VITALS — BP 138/86 | HR 78 | Temp 98.1°F | Resp 16 | Ht 63.0 in | Wt 131.2 lb

## 2017-04-22 DIAGNOSIS — R87613 High grade squamous intraepithelial lesion on cytologic smear of cervix (HGSIL): Secondary | ICD-10-CM | POA: Diagnosis not present

## 2017-04-22 DIAGNOSIS — N3000 Acute cystitis without hematuria: Secondary | ICD-10-CM | POA: Insufficient documentation

## 2017-04-22 DIAGNOSIS — I10 Essential (primary) hypertension: Secondary | ICD-10-CM | POA: Insufficient documentation

## 2017-04-22 DIAGNOSIS — N898 Other specified noninflammatory disorders of vagina: Secondary | ICD-10-CM | POA: Diagnosis not present

## 2017-04-22 DIAGNOSIS — Z803 Family history of malignant neoplasm of breast: Secondary | ICD-10-CM | POA: Insufficient documentation

## 2017-04-22 DIAGNOSIS — Z79899 Other long term (current) drug therapy: Secondary | ICD-10-CM | POA: Insufficient documentation

## 2017-04-22 DIAGNOSIS — Z888 Allergy status to other drugs, medicaments and biological substances status: Secondary | ICD-10-CM | POA: Insufficient documentation

## 2017-04-22 DIAGNOSIS — N879 Dysplasia of cervix uteri, unspecified: Secondary | ICD-10-CM | POA: Insufficient documentation

## 2017-04-22 DIAGNOSIS — F419 Anxiety disorder, unspecified: Secondary | ICD-10-CM | POA: Insufficient documentation

## 2017-04-22 DIAGNOSIS — Z886 Allergy status to analgesic agent status: Secondary | ICD-10-CM | POA: Insufficient documentation

## 2017-04-22 LAB — POCT URINALYSIS DIPSTICK
Bilirubin, UA: NEGATIVE
Blood, UA: NEGATIVE
Glucose, UA: NEGATIVE
Ketones, UA: NEGATIVE
Nitrite, UA: NEGATIVE
Protein, UA: NEGATIVE
Spec Grav, UA: 1.02 (ref 1.010–1.025)
Urobilinogen, UA: 0.2 E.U./dL
pH, UA: 6 (ref 5.0–8.0)

## 2017-04-22 MED ORDER — SULFAMETHOXAZOLE-TRIMETHOPRIM 800-160 MG PO TABS: 1.0000 | ORAL_TABLET | Freq: Two times a day (BID) | ORAL | 0 refills | Status: DC

## 2017-04-22 MED ORDER — METRONIDAZOLE 0.75 % VA GEL: 1.0000 | Freq: Every day | VAGINAL | 0 refills | Status: DC

## 2017-04-22 MED FILL — VANDAZOLE VAGINAL 0.75% GEL: 0.75 | 5 days supply | Qty: 70 | Fill #0

## 2017-04-22 MED FILL — SULFAMETHOXAZOLE-TMP DS TAB: 800-160 | 10 days supply | Qty: 20 | Fill #0

## 2017-04-22 NOTE — Patient Instructions (Signed)
Cervical Dysplasia Cervical dysplasia is a condition in which a woman's cervix cells have abnormal changes. The cervix is the opening of the uterus (womb). It is located between the vagina and the uterus. Cervical dysplasia may be an early sign of cervical cancer. If left untreated, this condition may become more severe and may progress to cervical cancer. Early detection, treatment, and follow-up care are very important. What are the causes? Cervical dysplasia can be caused by a human papillomavirus (HPV) infection. HPV is the most common sexually transmitted infection (STI). HPV is spread from person to person through sexual contact. This includes oral, vaginal, or anal sex. What increases the risk? The following factors may make you more likely to develop this condition:  Having had a sexually transmitted infection (STI), such as herpes, chlamydia or HPV.  Becoming sexually active before age 18.  Having had more than one sexual partner.  Having a sexual partner who has multiple sexual partners.  Not using protection, such as a condom, during sex, especially with new sexual partners.  Having a history of cancer of the vagina or vulva.  Having a weakened body defense (immune) system.  Being the daughter of a woman who took diethylstilbestrol (DES) during pregnancy.  Having a family history of cervical cancer.  Smoking.  Using oral contraceptives, also called birth control pills.  Having had three or more full-term pregnancies.  What are the signs or symptoms? There are usually no symptoms of this condition. If you do have symptoms, they may include:  Abnormal vaginal discharge.  Bleeding between periods or after sex.  Bleeding during menopause.  Pain during sex (dyspareunia).  How is this diagnosed? A test called a Pap test may be done. During this test, cells are taken from the cervix and then examined under a microscope. A test in which tissue is removed from the cervix  (biopsy) may also be done if the Pap test is abnormal or if the cervix looks abnormal. How is this treated? Treatment varies based on the severity of the condition. Treatment may include:  Cryotherapy. During cryotherapy, the abnormal cells are frozen with a steel-tip instrument.  Loop electrosurgical excision procedure (LEEP). This procedure removes abnormal tissue from the cervix.  Surgery to remove abnormal tissue. This is usually done in more advanced cases. Surgical options include: ? A cone biopsy. This is a procedure in which the cervical canal and a portion of the center of the cervix are removed. ? Hysterectomy. This is a surgery in which the uterus and cervix are removed.  Follow these instructions at home:  Take over-the-counter and prescription medicines only as told by your health care provider.  Do not use tampons, have sex, or douche until your health care provider says it is okay.  Keep follow-up visits as told by your health care provider. This is important. Women who have been treated for cervical dysplasia should have regular pelvic exams and Pap tests as told by their health care provider. How is this prevented?  Practice safe sex to help prevent sexually transmitted infections (STI) that may cause this condition.  Have regular Pap tests. Talk with your health care provider about how often you need these tests. Pap tests will help identify cell changes that can lead to cancer. Contact a health care provider if:  You develop genital warts. Get help right away if:  You have a fever.  You have abnormal vaginal discharge.  Your menstrual period is heavier than normal.  You develop bright   red bleeding. This may include blood clots.  You have pain or cramps that get worse, and medicine does not help to relieve your pain.  You feel light-headed and you are unusually weak.  You have fainting spells.  You have pain in the abdomen. Summary  Cervical dysplasia  is a condition in which a woman's cervix cells have abnormal changes.  If left untreated, this condition may become more severe and may progress to cervical cancer.  Early detection, treatment, and follow-up care are very important in managing this condition.  Have regular pelvic exams and Pap tests. Talk with your health care provider about how often you need these tests. Pap tests will help identify cell changes that can lead to cancer. This information is not intended to replace advice given to you by your health care provider. Make sure you discuss any questions you have with your health care provider. Document Released: 09/24/2005 Document Revised: 09/27/2016 Document Reviewed: 09/27/2016 Elsevier Interactive Patient Education  2017 Elsevier Inc.  

## 2017-04-22 NOTE — Progress Notes (Signed)
Follow up after GYN procedure Vaginal discharge  Urinary frequency

## 2017-04-22 NOTE — Progress Notes (Signed)
Subjective:  Patient ID: Kelsey Joyce, female    DOB: August 03, 1986  Age: 31 y.o. MRN: [id number]  CC: Follow-up   HPI Kelsey Joyce is a 31 year old female with a history of hypertension who presents today to establish care with me. Previously followed by Dr. Armen Pickup.  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 She endorses a family history of cervical cancer in her maternal grandmother and breast cancer in maternal aunt (diagnosed at the age of 41). Informs me she did have a mammogram on the day of colposcopy but results are not in Epic  Complains of brownish vaginal discharge or since her colposcopy with associated dysuria but no urinary frequency, no abdominal pain and no fever. She has no nausea or vomiting. Denies any recent sexual activity.  Past Medical History:  Diagnosis Date  . Anxiety   . Hypertension     No past surgical history on file.  Allergies  Allergen Reactions  . Acetaminophen Nausea Only  . Asa [Aspirin] Nausea Only     Outpatient Medications Prior to Visit  Medication Sig Dispense Refill  . amLODipine (NORVASC) 10 MG tablet Take 1 tablet (10 mg total) by mouth daily. 30 tablet 11  . cetirizine (ZYRTEC) 10 MG tablet Take 1 tablet (10 mg total) by mouth daily. 30 tablet 11  . chlorthalidone (HYGROTON) 25 MG tablet Take 1 tablet (25 mg total) by mouth daily. 30 tablet 11  . fluticasone (FLONASE) 50 MCG/ACT nasal spray Place 2 sprays into both nostrils daily. 16 g 6  . ibuprofen (ADVIL,MOTRIN) 200 MG tablet Take 200 mg by mouth every 6 (six) hours as needed for moderate pain.    Marland Kitchen acyclovir (ZOVIRAX) 400 MG tablet Take 1 tablet (400 mg total) by mouth 3 (three) times daily. For 5 days (Patient not taking: Reported on 04/22/2017) 15 tablet 0   No facility-administered medications prior to visit.     ROS Review of Systems    Constitutional: Negative for activity change, appetite change and fatigue.  HENT: Negative for congestion, sinus pressure and sore throat.   Eyes: Negative for visual disturbance.  Respiratory: Negative for cough, chest tightness, shortness of breath and wheezing.   Cardiovascular: Negative for chest pain and palpitations.  Gastrointestinal: Negative for abdominal distention, abdominal pain and constipation.  Endocrine: Negative for polydipsia.  Genitourinary: Positive for dysuria and vaginal discharge. Negative for frequency.  Musculoskeletal: Negative for arthralgias and back pain.  Skin: Negative for rash.  Neurological: Negative for tremors, light-headedness and numbness.  Hematological: Does not bruise/bleed easily.  Psychiatric/Behavioral: Negative for agitation and behavioral problems.    Objective:  BP 138/86 (Patient Position: Sitting, Cuff Size: Normal)   Pulse 78   Temp 98.1 F (36.7 C) (Oral)   Resp 16   Ht 5\' 3"  (1.6 m)   Wt 131 lb 3.2 oz (59.5 kg)   LMP 04/11/2017 (Exact Date)   SpO2 100%   BMI 23.24 kg/m   BP/Weight 04/22/2017 04/16/2017 04/02/2017  Systolic BP 138 120 151  Diastolic BP 86 80 95  Wt. (Lbs) 131.2 129.9 132  BMI 23.24 23.01 23.38      Physical Exam  Constitutional: She is oriented to person, place, and time. She appears well-developed and well-nourished.  Cardiovascular: Normal rate, normal heart sounds and intact distal pulses.   No murmur heard. Pulmonary/Chest: Effort normal and breath sounds normal. She has no  wheezes. She has no rales. She exhibits no tenderness.  Abdominal: Soft. Bowel sounds are normal. She exhibits no distension and no mass. There is no tenderness.  Musculoskeletal: Normal range of motion.  No CVA tenderness bilaterally  Neurological: She is alert and oriented to person, place, and time.     Assessment & Plan:   1. High grade squamous intraepithelial cervical dysplasia Patient advised to present to the GYN  office as she had received follow-up appointment I will see her back in one month for coordination of care  2. Vaginal discharge - metroNIDAZOLE (METROGEL VAGINAL) 0.75 % vaginal gel; Place 1 Applicatorful vaginally at bedtime.  Dispense: 70 g; Refill: 0  3. Acute cystitis without hematuria - sulfamethoxazole-trimethoprim (BACTRIM DS,SEPTRA DS) 800-160 MG tablet; Take 1 tablet by mouth 2 (two) times daily.  Dispense: 20 tablet; Refill: 0   Meds ordered this encounter  Medications  . metroNIDAZOLE (METROGEL VAGINAL) 0.75 % vaginal gel    Sig: Place 1 Applicatorful vaginally at bedtime.    Dispense:  70 g    Refill:  0  . sulfamethoxazole-trimethoprim (BACTRIM DS,SEPTRA DS) 800-160 MG tablet    Sig: Take 1 tablet by mouth 2 (two) times daily.    Dispense:  20 tablet    Refill:  0    Follow-up: Return in about 1 month (around 05/23/2017) for Follow-up on cervical dysplasia.   This note has been created with Education officer, environmentalDragon speech recognition software and smart phrase technology. Any transcriptional errors are unintentional.     Jaclyn ShaggyEnobong Amao MD

## 2017-04-30 ENCOUNTER — Telehealth: Payer: Self-pay | Admitting: General Practice

## 2017-04-30 NOTE — Telephone Encounter (Signed)
Called and left message for patient in regards to follow up appointment with Dr. Erin FullingHarraway-Smith on 05/21/17 at 12:40pm for LEEP procedure.  Asked patient to give our office a call if this is not a good time or day.

## 2017-04-30 NOTE — Telephone Encounter (Signed)
Called patient, no answer- left message to call us back concerning results & an appt. Will send message to front office staff

## 2017-04-30 NOTE — Telephone Encounter (Signed)
-----   Message from Willodean Rosenthalarolyn Harraway-Smith, MD sent at 04/28/2017  2:02 PM EDT ----- Please call pt.  She has HGSL. She needs a LEEP.  Thx, clh-S

## 2017-05-01 ENCOUNTER — Telehealth (HOSPITAL_COMMUNITY): Payer: Self-pay | Admitting: *Deleted

## 2017-05-01 NOTE — Telephone Encounter (Signed)
Patient called into front office and I informed her of results & explained LEEP procedure/treatment. Discussed with patient we will have her watch a short video about the procedure beforehand as well & reminded patient of LEEP appt. Patient verbalized understanding to all & had no questions at this time

## 2017-05-01 NOTE — Telephone Encounter (Signed)
Telephoned patient at home number and voicemail box not set up. Need to schedule time with [facility].

## 2017-05-03 MED FILL — ?CHLORTHALIDONE 25 MG TABLE: 25 | 30 days supply | Qty: 30 | Fill #4

## 2017-05-06 ENCOUNTER — Telehealth (HOSPITAL_COMMUNITY): Payer: Self-pay | Admitting: *Deleted

## 2017-05-06 NOTE — Telephone Encounter (Signed)
Telephoned patient at home number and left message to [facility]. Patient [facility] to schedule [facility].

## 2017-05-21 ENCOUNTER — Ambulatory Visit (INDEPENDENT_AMBULATORY_CARE_PROVIDER_SITE_OTHER): Payer: Self-pay | Admitting: Obstetrics & Gynecology

## 2017-05-21 ENCOUNTER — Encounter: Payer: Self-pay | Admitting: Obstetrics & Gynecology

## 2017-05-21 VITALS — BP 114/81 | HR 76 | Ht 63.0 in | Wt 132.1 lb

## 2017-05-21 DIAGNOSIS — R87613 High grade squamous intraepithelial lesion on cytologic smear of cervix (HGSIL): Secondary | ICD-10-CM

## 2017-05-21 NOTE — Patient Instructions (Signed)
Cervical Conization °Cervical conization (cone biopsy) is a procedure in which a cone-shaped portion of the cervix is cut out so that it can be examined under a microscope. The procedure is done to check for cancer cells or cells that might turn into cancer (precancerous cells). You may have this procedure if: °· You have abnormal bleeding from your cervix. °· You had an abnormal Pap test. °· Something abnormal was seen on your cervix during an exam. ° °This procedure is performed in either a health care provider’s office or in an operating room. °Tell a health care provider about: °· Any allergies you have. °· All medicines you are taking, including vitamins, herbs, eye drops, creams, and over-the-counter medicines. °· Any problems you or family members have had with the use of anesthetic medicines. °· Any blood disorders you have. °· Any surgeries you have had. °· Any medical conditions you have. °· Your smoking habits. °· When you normally have your period. °· Whether you are pregnant or may be pregnant. °What are the risks? °Generally, this is a safe procedure. However, problems may occur, including: °· Heavy bleeding for several days or weeks after the procedure. °· Allergic reactions to medicines or dyes. °· Increased risk of preterm labor in future pregnancies. °· Infection (rare). °· Damage to the cervix or other structures or organs (rare). ° °What happens before the procedure? °Staying hydrated °Follow instructions from your health care provider about hydration, which may include: °· Up to 2 hours before the procedure - you may continue to drink clear liquids, such as water, clear fruit juice, black coffee, and plain tea. ° °Eating and drinking restrictions °Follow instructions from your health care provider about eating and drinking, which may include: °· 8 hours before the procedure - stop eating heavy meals or foods such as meat, fried foods, or fatty foods. °· 6 hours before the procedure - stop eating  light meals or foods, such as toast or cereal. °· 6 hours before the procedure - stop drinking milk or drinks that contain milk. °· 2 hours before the procedure - stop drinking clear liquids. ° °General instructions °· Do not douche, have sex, use tampons, or use any vaginal medicines before the procedure as told by your health care provider. °· You may be asked to empty your bladder and bowel right before the procedure. °· Ask your health care provider about: °? Changing or stopping your normal medicines. This is important if you take diabetes medicines or blood thinners. °? Taking medicines such as aspirin and ibuprofen. These medicines can thin your blood. Do not take these medicines before your procedure if your doctor tells you not to. °· Plan to have someone take you home from the hospital or clinic. °What happens during the procedure? °· To reduce your risk of infection: °? Your health care team will wash or sanitize their hands. °? Your skin will be washed with soap. °? Hair may be removed from the surgical area. °· You will undress from the waist down and be given a gown to wear. °· You will lie on an examining table and put your feet in stirrups. °· An IV tube will be inserted into one of your veins. °· You will be given one or more of the following: °? A medicine to help you relax (sedative). °? A medicine to numb the area (local anesthetic). °? A medicine to make you fall asleep (general anesthetic). °? A medicine that numbs the cervix (cervical block). °·   A lubricated device called a speculum will be inserted into your vagina. It will be used to spread open the walls of the vagina so your health care provider can see the inside of the vagina and cervix better. °· An instrument that has a magnifying lens and a light (colposcope) will let your health care provider examine the cervix more closely. °· Your health care provider will apply a solution to your cervix. This turns abnormal areas a pale  color. °· A tissue sample will be removed from the cervix using one of the following methods: °? The cold knife method. In this method, the tissue is cut out with a knife (scalpel). °? The loop electrosurgical excision procedure (LEEP) method. In this method, the tissue is cut out with a thin wire that can burn (cauterize) the tissue with an electrical current. °? Laser treatment method. In this method, the tissue is cut out and then cauterized with a laser beam to prevent bleeding. °· Your health care provider will apply a paste over the biopsy areas to help control bleeding. °· The tissue sample will be examined under a microscope. °The procedure may vary among health care providers and hospitals. °What happens after the procedure? °· Your blood pressure, heart rate, breathing rate, and blood oxygen level will be monitored often until the medicines you were given have worn off. °· If you were given a local anesthetic, you will rest at the clinic or hospital until you are stable and feel ready to go home. °· If you were given a general anesthetic, you may be monitored for a longer period of time. °· You may have some cramping. °· You may have bloody discharge or light to moderate bleeding. °· You may have dark discharge coming from your vagina. This is from the paste used on the cervix to prevent bleeding. °Summary °· Cervical conization is a procedure in which a cone-shaped portion of the cervix is cut out so that it can be examined under a microscope. °· The procedure is done to check for cancer cells or cells that might turn into cancer (precancerous cells). °This information is not intended to replace advice given to you by your health care provider. Make sure you discuss any questions you have with your health care provider. °Document Released: 07/04/2005 Document Revised: 09/26/2016 Document Reviewed: 09/26/2016 °Elsevier Interactive Patient Education © 2017 Elsevier Inc. ° °

## 2017-05-21 NOTE — Progress Notes (Signed)
Pt was scheduled for a LEEP.  She presented today and reports that she was not prepared to hve a LEEP today. She did not feel that mshe have enough info to make a decision and she had questions.   I  Reviewed her surg path and the reason for the LEEP. I reviewed the pros and cons of having vs not having the procedure.    Patient desires surgical management with LEEP.  The risks of surgery were discussed in detail with the patient including but not limited to: bleeding which may require transfusion or reoperation; infection which may require prolonged hospitalization or re-hospitalization and antibiotic therapy; injury to bowel, bladder and major vessels or other surrounding organs; increased risk of incompetent cervix in pregnancy and other postoperative or anesthesia complications.  Patient was told that the likelihood that her condition and symptoms will be treated effectively with this surgical management was very high; the postoperative expectations were also discussed in detail.   She will f/u in 2 weeks for a LEEP. I have asked her to watch the LEEP video again. She has an appt with [facility] today.   Total face-to-face time with patient was 15 min.  Greater than 50% was spent in counseling and coordination of care with the patient.   Cristella Stiver L. Harraway-Smith, M.D., Evern CoreFACOG

## 2017-05-22 MED FILL — AMLODIPINE BESYLATE 10 MG T: 10 | 30 days supply | Qty: 30 | Fill #1

## 2017-05-22 MED FILL — ?CETIRIZINE HCL 10 MG TABLE: 10 | 30 days supply | Qty: 30 | Fill #3

## 2017-05-23 ENCOUNTER — Telehealth: Payer: Self-pay

## 2017-05-23 DIAGNOSIS — N898 Other specified noninflammatory disorders of vagina: Secondary | ICD-10-CM

## 2017-05-23 MED ORDER — METRONIDAZOLE 0.75 % VA GEL: 1.0000 | Freq: Every day | VAGINAL | 0 refills | Status: DC

## 2017-05-23 MED FILL — VANDAZOLE VAGINAL 0.75% GEL: 0.75 | 5 days supply | Qty: 70 | Fill #0

## 2017-05-23 NOTE — Telephone Encounter (Signed)
Rx sent to pharmacy   

## 2017-05-23 NOTE — Telephone Encounter (Signed)
Pt contacted the office and stated the last time she was in was 2 weeks ago and she came in for a yeast infection her cycle went off last week and she is starting to have the sam symptoms as before. Pt states it's nothing sexual cause she hasn't had intercourse. Pt states she has an odor. Pt wants to know does she need to come in for an appointment or could she be prescribed what she had before. Pt states there is discharge that has odor like cottage cheese smell. Pt states if a rx is given she would like it sent to our pharmacy. Please f/u

## 2017-05-28 ENCOUNTER — Ambulatory Visit: Payer: Medicaid Other | Attending: Family Medicine | Admitting: Family Medicine

## 2017-05-28 ENCOUNTER — Encounter: Payer: Self-pay | Admitting: Family Medicine

## 2017-05-28 VITALS — BP 115/80 | HR 80 | Temp 97.5°F | Ht 63.0 in | Wt 130.2 lb

## 2017-05-28 DIAGNOSIS — R87613 High grade squamous intraepithelial lesion on cytologic smear of cervix (HGSIL): Secondary | ICD-10-CM | POA: Diagnosis not present

## 2017-05-28 DIAGNOSIS — I10 Essential (primary) hypertension: Secondary | ICD-10-CM | POA: Insufficient documentation

## 2017-05-28 DIAGNOSIS — N879 Dysplasia of cervix uteri, unspecified: Secondary | ICD-10-CM | POA: Insufficient documentation

## 2017-05-28 MED ORDER — CHLORTHALIDONE 25 MG PO TABS: 25.0000 mg | ORAL_TABLET | Freq: Every day | ORAL | 1 refills | Status: DC

## 2017-05-28 NOTE — Patient Instructions (Signed)

## 2017-05-28 NOTE — Progress Notes (Signed)
Subjective:  Patient ID: Kelsey Joyce, female    DOB: [birth year]  Age: 31 y.o. MRN: [id number]  CC: Follow-up   HPI Kelsey Joyce is a 31 year old female with a history of hypertension,HGSIL who presents today for a follow-up visit. She was seen by GYN last week and has been scheduled for a LEEP procedure in 2 weeks. At the time of her visit she states she had been mentally unprepared for the procedure but has not been able to watch the video and is ready now.  She has been compliant with her antihypertensives and has enough refills. She has no acute concerns today.  Past Medical History:  Diagnosis Date  . Anxiety   . Hypertension     No past surgical history on file.    Outpatient Medications Prior to Visit  Medication Sig Dispense Refill  . amLODipine (NORVASC) 10 MG tablet Take 1 tablet (10 mg total) by mouth daily. 30 tablet 11  . cetirizine (ZYRTEC) 10 MG tablet Take 1 tablet (10 mg total) by mouth daily. 30 tablet 11  . fluticasone (FLONASE) 50 MCG/ACT nasal spray Place 2 sprays into both nostrils daily. 16 g 6  . ibuprofen (ADVIL,MOTRIN) 200 MG tablet Take 200 mg by mouth every 6 (six) hours as needed for moderate pain.    . metroNIDAZOLE (METROGEL VAGINAL) 0.75 % vaginal gel Place 1 Applicatorful vaginally at bedtime. 70 g 0  . acyclovir (ZOVIRAX) 400 MG tablet Take 1 tablet (400 mg total) by mouth 3 (three) times daily. For 5 days (Patient not taking: Reported on 04/22/2017) 15 tablet 0  . chlorthalidone (HYGROTON) 25 MG tablet Take 1 tablet (25 mg total) by mouth daily. (Patient not taking: Reported on 05/28/2017) 30 tablet 11  . sulfamethoxazole-trimethoprim (BACTRIM DS,SEPTRA DS) 800-160 MG tablet Take 1 tablet by mouth 2 (two) times daily. (Patient not taking: Reported on 05/21/2017) 20 tablet 0   No facility-administered medications prior to visit.     ROS Review of Systems  Constitutional: Negative for activity change, appetite change and fatigue.  HENT:  Negative for congestion, sinus pressure and sore throat.   Eyes: Negative for visual disturbance.  Respiratory: Negative for cough, chest tightness, shortness of breath and wheezing.   Cardiovascular: Negative for chest pain and palpitations.  Gastrointestinal: Negative for abdominal distention, abdominal pain and constipation.  Endocrine: Negative for polydipsia.  Genitourinary: Negative for dysuria and frequency.  Musculoskeletal: Negative for arthralgias and back pain.  Skin: Negative for rash.  Neurological: Negative for tremors, light-headedness and numbness.  Hematological: Does not bruise/bleed easily.  Psychiatric/Behavioral: Negative for agitation and behavioral problems.    Objective:  BP 115/80   Pulse 80   Temp (!) 97.5 F (36.4 C) (Oral)   Ht 5\' 3"  (1.6 m)   Wt 130 lb 3.2 oz (59.1 kg)   LMP 05/12/2017 (Exact Date)   SpO2 100%   BMI 23.06 kg/m   BP/Weight 05/28/2017 05/21/2017 04/22/2017  Systolic BP 115 114 138  Diastolic BP 80 81 86  Wt. (Lbs) 130.2 132.1 131.2  BMI 23.06 23.4 23.24      Physical Exam  Constitutional: She is oriented to person, place, and time. She appears well-developed and well-nourished.  Cardiovascular: Normal rate, normal heart sounds and intact distal pulses.   No murmur heard. Pulmonary/Chest: Effort normal and breath sounds normal. She has no wheezes. She has no rales. She exhibits no tenderness.  Abdominal: Soft. Bowel sounds are normal. She exhibits no distension and no mass. There is  no tenderness.  Musculoskeletal: Normal range of motion.  Neurological: She is alert and oriented to person, place, and time.     Assessment & Plan:   1. HYPERTENSION, BENIGN ESSENTIAL Controlled Low-sodium diet We'll check potassium level - chlorthalidone (HYGROTON) 25 MG tablet; Take 1 tablet (25 mg total) by mouth daily.  Dispense: 30 tablet; Refill: 1 - Basic Metabolic Panel  2. High grade squamous intraepithelial cervical  dysplasia Scheduled for LEEP in 2 weeks   Meds ordered this encounter  Medications  . chlorthalidone (HYGROTON) 25 MG tablet    Sig: Take 1 tablet (25 mg total) by mouth daily.    Dispense:  30 tablet    Refill:  1    Follow-up: Return in about 3 months (around 08/28/2017) for follow up of medical conditions.   Jaclyn Shaggy MD

## 2017-05-29 LAB — BASIC METABOLIC PANEL
BUN/Creatinine Ratio: 9 (ref 9–23)
BUN: 7 mg/dL (ref 6–20)
CO2: 24 mmol/L (ref 20–29)
Calcium: 9.1 mg/dL (ref 8.7–10.2)
Chloride: 99 mmol/L (ref 96–106)
Creatinine, Ser: 0.74 mg/dL (ref 0.57–1.00)
GFR calc Af Amer: 125 mL/min/{1.73_m2} (ref >59)
GFR calc non Af Amer: 108 mL/min/{1.73_m2} (ref >59)
Glucose: 96 mg/dL (ref 65–99)
Potassium: 3.3 mmol/L — ABNORMAL LOW (ref 3.5–5.2)
Sodium: 138 mmol/L (ref 134–144)

## 2017-05-30 ENCOUNTER — Other Ambulatory Visit: Payer: Self-pay | Admitting: Family Medicine

## 2017-05-30 MED ORDER — POTASSIUM CHLORIDE CRYS ER 10 MEQ PO TBCR: 10.0000 meq | EXTENDED_RELEASE_TABLET | Freq: Every day | ORAL | 2 refills | Status: DC

## 2017-06-07 MED FILL — ?CHLORTHALIDONE 25 MG TABLE: 25 | 30 days supply | Qty: 30 | Fill #5

## 2017-06-21 ENCOUNTER — Encounter: Payer: Self-pay | Admitting: Obstetrics & Gynecology

## 2017-06-21 ENCOUNTER — Ambulatory Visit (INDEPENDENT_AMBULATORY_CARE_PROVIDER_SITE_OTHER): Payer: Medicaid Other | Admitting: Obstetrics & Gynecology

## 2017-06-21 ENCOUNTER — Other Ambulatory Visit (HOSPITAL_COMMUNITY)
Admission: RE | Admit: 2017-06-21 | Discharge: 2017-06-21 | Disposition: A | Discharge status: Home / Self Care | Payer: Medicaid Other | Source: Ambulatory Visit | Attending: Obstetrics & Gynecology | Admitting: Obstetrics & Gynecology

## 2017-06-21 VITALS — BP 123/81 | HR 90 | Ht 63.0 in | Wt 131.5 lb

## 2017-06-21 DIAGNOSIS — Z3202 Encounter for pregnancy test, result negative: Secondary | ICD-10-CM | POA: Insufficient documentation

## 2017-06-21 DIAGNOSIS — N871 Moderate cervical dysplasia: Secondary | ICD-10-CM

## 2017-06-21 LAB — POCT PREGNANCY, URINE: Preg Test, Ur: NEGATIVE

## 2017-06-21 NOTE — Addendum Note (Signed)
Addended by: Willodean Rosenthal on: 06/21/2017 11:22 AM   Modules accepted: Orders

## 2017-06-21 NOTE — Progress Notes (Signed)
Pap smear and colposcopy reviewed.   Pap 01/23/2017 Satisfactory for evaluation endocervical/transformation zone component PRESENT.     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 Endocervix, curettage - SCANT INFLAMED MUCUS. 2. Cervix, biopsy - HIGH GRADE SQUAMOUS INTRAEPITHELIAL LESION, CIN-II (MODERATE DYSPLASIA).ECC   Risks, benefits, alternatives, and limitations of procedure explained to patient, including pain, bleeding, infection, failure to remove abnormal tissue and failure to cure dysplasia, need for repeat procedures, damage to pelvic organs, cervical incompetence.  Role of HPV,cervical dysplasia and need for close followup was empasized. Informed written consent was obtained. All questions were answered. Time out performed.  Procedure: The patient was placed in lithotomy position and the bivalved coated speculum was placed in the patient's vagina. A grounding pad placed on the patient. Local anesthesia was administered via an intracervical block using 20cc of 2% Lidocaine with epinephrine. The suction was turned on and the Medium 1X Fisher Cone Biopsy Excisor on 36 Watts of cutting current was used to excise the area of decreased uptake and excise the entire transformation zone. Excellent hemostasis was achieved using roller ball coagulation set at 80 Watts coagulation current. Monsel's solution was then applied and excellent hemostasis was noted.  The speculum was removed from the vagina. Specimens were sent to pathology. The patient tolerated the procedure well. Post-operative instructions given to patient, including instruction to seek medical attention for persistent bright red bleeding, fever, abdominal/pelvic pain, dysuria, nausea or vomiting. She was also told about the possibility of having copious yellow to black tinged discharge. She was counseled to avoid anything in the vagina  (sex/douching/tampons) for 4 weeks. She has a  4 week post-operative check to review results and assess wound healing.    Harvel Meskill L. Harraway-Smith, M.D., [facility]

## 2017-06-21 NOTE — Patient Instructions (Signed)
Cervical Conization, Care After  This sheet gives you information about how to care for yourself after your procedure. Your health care provider may also give you more specific instructions. If you have problems or questions, contact your health care provider.  What can I expect after the procedure?  After the procedure, it is common to have:   A groggy feeling, if you were given medicine to make you fall asleep (general anesthetic).   Cramps that feel similar to menstrual cramps.   Bloody discharge or light to moderate bleeding.   Dark discharge. This discharge may look similar to coffee grounds. This is from the paste that was applied to the cervix to control bleeding.    Follow these instructions at home:  Medicines   Take over-the-counter and prescription medicines only as told by your health care provider.   Do not take aspirin until your health care provider says it is okay. Aspirin can cause bleeding.   If you are taking pain medicine:   You may need to prevent or treat constipation. To do this, your health care provider may recommend that you:   Drink enough fluid to keep your urine clear or pale yellow.   Take over-the-counter or prescription medicines.   Eat foods that are high in fiber, such as fresh fruits and vegetables, whole grains, bran, and beans.   Limit foods that are high in fat and processed sugars, such as fried and sweet foods.   Do not drive or use heavy machinery.  General instructions   You may resume your normal diet unless your health care provider advises you not to do so.   Take showers for the first week. Do not take baths, swim, or use hot tubs until your health care provider says it is okay.   Do not douche, use tampons, or have sex until your health care provider says it is okay.   Avoid activities that require great effort, such as exercises and heavy lifting, for at least 7-14 days.   Keep all follow-up visits as told by your health care provider. This is  important.  Contact a health care provider if:   You develop a rash.   You are dizzy or lightheaded.   You feel nauseous or you vomit.   You develop a bad smelling discharge from your vagina.  Get help right away if:   You have blood clots or bleeding that is heavier than a normal period. Bleeding that soaks a pad in less than 1 hour is considered heavy bleeding.   You have a fever.   You have increasing cramps.   You faint.   You have pain when you urinate.   You have severe or worsening pain.   Your pain is not relieved when you take medicine.   You have bloody urine.   You vomit.  Summary   After the procedure, it is common to have cramps and dark or bloody discharge from your vagina.   Do not douche, use tampons, or have sex until your health care provider says it is okay.   Follow all other activity restrictions as told by your health care provider.  This information is not intended to replace advice given to you by your health care provider. Make sure you discuss any questions you have with your health care provider.  Document Released: 09/24/2005 Document Revised: 09/26/2016 Document Reviewed: 09/26/2016  Elsevier Interactive Patient Education  2017 [employer]

## 2017-06-26 ENCOUNTER — Encounter: Payer: Self-pay | Admitting: *Deleted

## 2017-06-26 MED FILL — ?CETIRIZINE HCL 10 MG TABLE: 10 | 30 days supply | Qty: 30 | Fill #4

## 2017-06-26 MED FILL — FLUTICASONE PROP 50 MCG SPR: 50 | 30 days supply | Qty: 16 | Fill #5

## 2017-06-26 MED FILL — AMLODIPINE BESYLATE 10 MG T: 10 | 30 days supply | Qty: 30 | Fill #2

## 2017-07-30 ENCOUNTER — Encounter: Payer: Self-pay | Admitting: Obstetrics & Gynecology

## 2017-07-30 ENCOUNTER — Ambulatory Visit (INDEPENDENT_AMBULATORY_CARE_PROVIDER_SITE_OTHER): Payer: Medicaid Other | Admitting: Obstetrics & Gynecology

## 2017-07-30 VITALS — BP 119/71 | HR 75 | Wt 130.9 lb

## 2017-07-30 DIAGNOSIS — R87613 High grade squamous intraepithelial lesion on cytologic smear of cervix (HGSIL): Secondary | ICD-10-CM

## 2017-07-30 NOTE — Progress Notes (Signed)
History:  31 y.o. G0P0000 here today for post LEEP check. She denies bleeding or abnormal discharge.   The following portions of the patient's history were reviewed and updated as appropriate: allergies, current medications, past family history, past medical history, past social history, past surgical history and problem list.  Review of Systems:  Pertinent items are noted in HPI.   Objective:  Physical Exam Blood pressure 119/71, pulse 75, weight 130 lb 14.4 oz (59.4 kg), last menstrual period 07/11/2017. Gen: NAD Abd: Soft, nontender and nondistended Pelvic: Normal appearing external genitalia; normal appearing vaginal mucosa and cervix.  Normal discharge.  Cervix well healed. Lab Diagnosis Cervix, LEEP - MODERATE SQUAMOUS DYSPLASIA (CIN-II). - THE SURGICAL RESECTION MARGINS ARE NEGATIVE FOR HIGH GRADE SQUAMOUS DYSPLASIA. - SEE COMMENT9/14/18  Assessment & Plan:  Post LEEP check. Pt is doing well  F/u in 1 year or sooner prn May return to being sexually active.  Reviewed surg path  Total face-to-face time with patient was 10 min.  Greater than 50% was spent in counseling and coordination of care with the patient.  Jahaziel Francois L. Harraway-Smith, M.D., [facility] CoreFACOG

## 2017-08-09 MED FILL — AMLODIPINE BESYLATE 10 MG T: 10 | 30 days supply | Qty: 30 | Fill #3

## 2017-08-09 MED FILL — ?CETIRIZINE HCL 10 MG TABLE: 10 | 30 days supply | Qty: 30 | Fill #5

## 2017-08-09 MED FILL — ?CHLORTHALIDONE 25 MG TABLE: 25 | 30 days supply | Qty: 30 | Fill #6

## 2017-08-27 ENCOUNTER — Encounter: Payer: Self-pay | Admitting: Family Medicine

## 2017-08-27 ENCOUNTER — Ambulatory Visit: Payer: Medicaid Other | Attending: Family Medicine | Admitting: Family Medicine

## 2017-08-27 VITALS — BP 119/76 | HR 79 | Temp 98.1°F | Ht 63.0 in | Wt 132.0 lb

## 2017-08-27 DIAGNOSIS — R51 Headache: Secondary | ICD-10-CM | POA: Diagnosis not present

## 2017-08-27 DIAGNOSIS — Z23 Encounter for immunization: Secondary | ICD-10-CM | POA: Insufficient documentation

## 2017-08-27 DIAGNOSIS — J3489 Other specified disorders of nose and nasal sinuses: Secondary | ICD-10-CM

## 2017-08-27 DIAGNOSIS — F419 Anxiety disorder, unspecified: Secondary | ICD-10-CM | POA: Insufficient documentation

## 2017-08-27 DIAGNOSIS — I10 Essential (primary) hypertension: Secondary | ICD-10-CM | POA: Diagnosis not present

## 2017-08-27 DIAGNOSIS — Z79899 Other long term (current) drug therapy: Secondary | ICD-10-CM | POA: Diagnosis not present

## 2017-08-27 DIAGNOSIS — R87613 High grade squamous intraepithelial lesion on cytologic smear of cervix (HGSIL): Secondary | ICD-10-CM | POA: Diagnosis not present

## 2017-08-27 DIAGNOSIS — E876 Hypokalemia: Secondary | ICD-10-CM | POA: Insufficient documentation

## 2017-08-27 DIAGNOSIS — R519 Headache, unspecified: Secondary | ICD-10-CM

## 2017-08-27 MED ORDER — FLUTICASONE PROPIONATE 50 MCG/ACT NA SUSP
2.0000 | Freq: Every day | NASAL | 6 refills | Status: DC
Start: 2017-08-27 — End: 2022-01-03

## 2017-08-27 MED ORDER — CETIRIZINE HCL 10 MG PO TABS: 10.0000 mg | ORAL_TABLET | Freq: Every day | ORAL | 6 refills | Status: DC

## 2017-08-27 MED ORDER — CHLORTHALIDONE 25 MG PO TABS: 25.0000 mg | ORAL_TABLET | Freq: Every day | ORAL | 6 refills | Status: DC

## 2017-08-27 MED ORDER — AMLODIPINE BESYLATE 10 MG PO TABS: 10.0000 mg | ORAL_TABLET | Freq: Every day | ORAL | 6 refills | Status: DC

## 2017-08-27 MED ORDER — IBUPROFEN 600 MG PO TABS: 600.0000 mg | ORAL_TABLET | Freq: Two times a day (BID) | ORAL | 0 refills | Status: DC | PRN

## 2017-08-27 NOTE — Progress Notes (Signed)
Subjective:  Patient ID: Kelsey Smothersameshia Wax, female    DOB: Feb 10, 1986  Age: 31 y.o. MRN: [id number]  CC: Headache and Hypertension   HPI Kelsey Joyce is a 31 year old female with a history of hypertension,HGSIL status post LEEP procedure; her last visit to GYN was on 07/30/17.   Pathology report 06/21/17: Cervix, LEEP - MODERATE SQUAMOUS DYSPLASIA (CIN-II). - THE SURGICAL RESECTION MARGINS ARE NEGATIVE FOR HIGH GRADE SQUAMOUS DYSPLASIA. - SEE COMMENT.  She complains of 'lingering' headache for the last 4 days which is directly above her nasal bridge and also located in the occipital region which is described as a 5/10.  She denies associated nausea, vomiting, blurry vision. She does have a history of chronic sinusitis and has been taking Zyrtec and Flonase. Use of Aleve has not helped her symptoms. She gets good quality sleep and denies sleep deprivation.  Past Medical History:  Diagnosis Date  . Anxiety   . Hypertension     No past surgical history on file.   Outpatient Medications Prior to Visit  Medication Sig Dispense Refill  . amLODipine (NORVASC) 10 MG tablet Take 1 tablet (10 mg total) by mouth daily. 30 tablet 11  . cetirizine (ZYRTEC) 10 MG tablet Take 1 tablet (10 mg total) by mouth daily. 30 tablet 11  . chlorthalidone (HYGROTON) 25 MG tablet Take 1 tablet (25 mg total) by mouth daily. 30 tablet 1  . fluticasone (FLONASE) 50 MCG/ACT nasal spray Place 2 sprays into both nostrils daily. 16 g 6  . ibuprofen (ADVIL,MOTRIN) 200 MG tablet Take 200 mg by mouth every 6 (six) hours as needed for moderate pain.    . potassium chloride SA (K-DUR,KLOR-CON) 10 MEQ tablet Take 1 tablet (10 mEq total) by mouth daily. (Patient not taking: Reported on 08/27/2017) 30 tablet 2   No facility-administered medications prior to visit.     ROS Review of Systems  Constitutional: Negative for activity change, appetite change and fatigue.  HENT: Negative for congestion, sinus pressure  and sore throat.   Eyes: Negative for visual disturbance.  Respiratory: Negative for cough, chest tightness, shortness of breath and wheezing.   Cardiovascular: Negative for chest pain and palpitations.  Gastrointestinal: Negative for abdominal distention, abdominal pain and constipation.  Endocrine: Negative for polydipsia.  Genitourinary: Negative for dysuria and frequency.  Musculoskeletal: Negative for arthralgias and back pain.  Skin: Negative for rash.  Neurological: Positive for headaches. Negative for tremors, light-headedness and numbness.  Hematological: Does not bruise/bleed easily.  Psychiatric/Behavioral: Negative for agitation and behavioral problems.    Objective:  BP 119/76   Pulse 79   Temp 98.1 F (36.7 C) (Oral)   Ht 5\' 3"  (1.6 m)   Wt 132 lb (59.9 kg)   SpO2 100%   BMI 23.38 kg/m   BP/Weight 08/27/2017 07/30/2017 06/21/2017  Systolic BP 119 119 123  Diastolic BP 76 71 81  Wt. (Lbs) 132 130.9 131.5  BMI 23.38 23.19 23.29      Physical Exam  Constitutional: She is oriented to person, place, and time. She appears well-developed and well-nourished.  Cardiovascular: Normal rate, normal heart sounds and intact distal pulses.  No murmur heard. Pulmonary/Chest: Effort normal and breath sounds normal. She has no wheezes. She has no rales. She exhibits no tenderness.  Abdominal: Soft. Bowel sounds are normal. She exhibits no distension and no mass. There is no tenderness.  Musculoskeletal: Normal range of motion.  Neurological: She is alert and oriented to person, place, and time.  Skin: Skin  is warm and dry.  Psychiatric: She has a normal mood and affect.    BMET    Component Value Date/Time   NA 138 05/28/2017 1358   K 3.3 (L) 05/28/2017 1358   CL 99 05/28/2017 1358   CO2 24 05/28/2017 1358   GLUCOSE 96 05/28/2017 1358   GLUCOSE 88 11/01/2016 0924   BUN 7 05/28/2017 1358   CREATININE 0.74 05/28/2017 1358   CREATININE 0.78 11/01/2016 0924   CALCIUM  9.1 05/28/2017 1358   GFRNONAA 108 05/28/2017 1358   GFRNONAA >89 11/01/2016 0924   GFRAA 125 05/28/2017 1358   GFRAA >89 11/01/2016 0924     Assessment & Plan:   1. HYPERTENSION, BENIGN ESSENTIAL Controlled Low-sodium diet - amLODipine (NORVASC) 10 MG tablet; Take 1 tablet (10 mg total) by mouth daily.  Dispense: 30 tablet; Refill: 6 - chlorthalidone (HYGROTON) 25 MG tablet; Take 1 tablet (25 mg total) by mouth daily.  Dispense: 30 tablet; Refill: 6  2. Sinus pressure Stable - cetirizine (ZYRTEC) 10 MG tablet; Take 1 tablet (10 mg total) by mouth daily.  Dispense: 30 tablet; Refill: 6 - fluticasone (FLONASE) 50 MCG/ACT nasal spray; Place 2 sprays into both nostrils daily.  Dispense: 16 g; Refill: 6  3. Sinus headache Unable to take Tylenol - ibuprofen (ADVIL,MOTRIN) 600 MG tablet; Take 1 tablet (600 mg total) by mouth every 12 (twelve) hours as needed for moderate pain.  Dispense: 30 tablet; Refill: 0  4. Hypokalemia Potassium was 3.3; could be secondary to chlorthalidone use Tried lisinopril in the past and did not do well on it She was prescribed potassium pills which she never 2. Check potassium level again today and resume potassium if indicated. - Basic Metabolic Panel  5. Need for influenza vaccination - Flu Vaccine QUAD 36+ mos IM   Meds ordered this encounter  Medications  . amLODipine (NORVASC) 10 MG tablet    Sig: Take 1 tablet (10 mg total) by mouth daily.    Dispense:  30 tablet    Refill:  6  . cetirizine (ZYRTEC) 10 MG tablet    Sig: Take 1 tablet (10 mg total) by mouth daily.    Dispense:  30 tablet    Refill:  6  . chlorthalidone (HYGROTON) 25 MG tablet    Sig: Take 1 tablet (25 mg total) by mouth daily.    Dispense:  30 tablet    Refill:  6  . ibuprofen (ADVIL,MOTRIN) 600 MG tablet    Sig: Take 1 tablet (600 mg total) by mouth every 12 (twelve) hours as needed for moderate pain.    Dispense:  30 tablet    Refill:  0  . fluticasone (FLONASE) 50  MCG/ACT nasal spray    Sig: Place 2 sprays into both nostrils daily.    Dispense:  16 g    Refill:  6    Follow-up: Return in about 6 months (around 02/24/2018) for follow up on Hypertension.   Jaclyn ShaggyEnobong Amao MD

## 2017-08-28 LAB — BASIC METABOLIC PANEL
BUN/Creatinine Ratio: 15 (ref 9–23)
BUN: 12 mg/dL (ref 6–20)
CO2: 25 mmol/L (ref 20–29)
Calcium: 9 mg/dL (ref 8.7–10.2)
Chloride: 102 mmol/L (ref 96–106)
Creatinine, Ser: 0.8 mg/dL (ref 0.57–1.00)
GFR calc Af Amer: 114 mL/min/{1.73_m2} (ref >59)
GFR calc non Af Amer: 99 mL/min/{1.73_m2} (ref >59)
Glucose: 75 mg/dL (ref 65–99)
Potassium: 4.1 mmol/L (ref 3.5–5.2)
Sodium: 140 mmol/L (ref 134–144)

## 2017-09-20 MED FILL — AMLODIPINE BESYLATE 10 MG T: 10 | 30 days supply | Qty: 30 | Fill #4

## 2017-09-20 MED FILL — ?CETIRIZINE HCL 10 MG TABLE: 10 | 30 days supply | Qty: 30 | Fill #6

## 2017-09-20 MED FILL — ?CHLORTHALIDONE 25 MG TABLE: 25 | 30 days supply | Qty: 30 | Fill #7

## 2017-09-23 MED FILL — IBUPROFEN 600 MG TABLET: 600 | 15 days supply | Qty: 30 | Fill #0

## 2017-10-23 MED FILL — AMLODIPINE BESYLATE 10 MG T: 10 | 30 days supply | Qty: 30 | Fill #5

## 2017-10-23 MED FILL — ?CHLORTHALIDONE 25 MG TABLE: 25 | 30 days supply | Qty: 30 | Fill #8

## 2017-10-23 MED FILL — ?CETIRIZINE HCL 10 MG TABLE: 10 | 30 days supply | Qty: 30 | Fill #7

## 2017-11-13 ENCOUNTER — Encounter: Payer: Self-pay | Admitting: Family Medicine

## 2017-11-13 ENCOUNTER — Ambulatory Visit: Payer: Medicaid Other | Attending: Family Medicine | Admitting: Family Medicine

## 2017-11-13 VITALS — BP 119/81 | HR 80 | Temp 98.2°F | Ht 63.0 in | Wt 123.4 lb

## 2017-11-13 DIAGNOSIS — R05 Cough: Secondary | ICD-10-CM | POA: Diagnosis present

## 2017-11-13 DIAGNOSIS — Z79899 Other long term (current) drug therapy: Secondary | ICD-10-CM | POA: Diagnosis not present

## 2017-11-13 DIAGNOSIS — Z886 Allergy status to analgesic agent status: Secondary | ICD-10-CM | POA: Insufficient documentation

## 2017-11-13 DIAGNOSIS — R52 Pain, unspecified: Secondary | ICD-10-CM | POA: Insufficient documentation

## 2017-11-13 DIAGNOSIS — I10 Essential (primary) hypertension: Secondary | ICD-10-CM | POA: Diagnosis not present

## 2017-11-13 DIAGNOSIS — F419 Anxiety disorder, unspecified: Secondary | ICD-10-CM | POA: Insufficient documentation

## 2017-11-13 DIAGNOSIS — J029 Acute pharyngitis, unspecified: Secondary | ICD-10-CM | POA: Diagnosis present

## 2017-11-13 DIAGNOSIS — M791 Myalgia, unspecified site: Secondary | ICD-10-CM | POA: Insufficient documentation

## 2017-11-13 DIAGNOSIS — B349 Viral infection, unspecified: Secondary | ICD-10-CM | POA: Diagnosis not present

## 2017-11-13 MED ORDER — OSELTAMIVIR PHOSPHATE 75 MG PO CAPS: 75.0000 mg | ORAL_CAPSULE | Freq: Two times a day (BID) | ORAL | 0 refills | Status: DC

## 2017-11-13 NOTE — Patient Instructions (Signed)

## 2017-11-13 NOTE — Progress Notes (Signed)
Subjective:  Patient ID: Kelsey Joyce, female    DOB: 1985/11/05  Age: 32 y.o. MRN: [id number]  CC: Cough; Sore Throat; and Generalized Body Aches   HPI Kelsey Joyce is a 32 year old female with a history of hypertension who presents today with complaints of a 4-day history of sore throat, cough, myalgias which she describes as "if an 3218 wheeler had run over her". She denies nasal drip. She left work early yesterday and was unable to go to work today;   She works in [employer]. Denies fever, nausea, vomiting and has not used any OTC medications. She denies history of sick contacts.  Past Medical History:  Diagnosis Date  . Anxiety   . Hypertension     No past surgical history on file.  Allergies  Allergen Reactions  . Acetaminophen Nausea Only  . Asa [Aspirin] Nausea Only     Outpatient Medications Prior to Visit  Medication Sig Dispense Refill  . amLODipine (NORVASC) 10 MG tablet Take 1 tablet (10 mg total) by mouth daily. 30 tablet 6  . cetirizine (ZYRTEC) 10 MG tablet Take 1 tablet (10 mg total) by mouth daily. 30 tablet 6  . chlorthalidone (HYGROTON) 25 MG tablet Take 1 tablet (25 mg total) by mouth daily. 30 tablet 6  . fluticasone (FLONASE) 50 MCG/ACT nasal spray Place 2 sprays into both nostrils daily. 16 g 6  . ibuprofen (ADVIL,MOTRIN) 600 MG tablet Take 1 tablet (600 mg total) by mouth every 12 (twelve) hours as needed for moderate pain. 30 tablet 0  . potassium chloride SA (K-DUR,KLOR-CON) 10 MEQ tablet Take 1 tablet (10 mEq total) by mouth daily. (Patient not taking: Reported on 08/27/2017) 30 tablet 2   No facility-administered medications prior to visit.     ROS Review of Systems  Constitutional: Positive for activity change and fatigue. Negative for appetite change and fever.  HENT: Positive for sore throat. Negative for congestion and sinus pressure.   Eyes: Negative for visual disturbance.  Respiratory: Positive for cough. Negative for chest  tightness, shortness of breath and wheezing.   Cardiovascular: Negative for chest pain and palpitations.  Gastrointestinal: Negative for abdominal distention, abdominal pain and constipation.  Endocrine: Negative for polydipsia.  Genitourinary: Negative for dysuria and frequency.  Musculoskeletal: Positive for myalgias. Negative for arthralgias and back pain.  Skin: Negative for rash.  Neurological: Negative for tremors, light-headedness and numbness.  Hematological: Does not bruise/bleed easily.  Psychiatric/Behavioral: Negative for agitation and behavioral problems.    Objective:  BP 119/81   Pulse 80   Temp 98.2 F (36.8 C) (Oral)   Ht 5\' 3"  (1.6 m)   Wt 123 lb 6.4 oz (56 kg)   SpO2 100%   BMI 21.86 kg/m   BP/Weight 11/13/2017 08/27/2017 07/30/2017  Systolic BP 119 119 119  Diastolic BP 81 76 71  Wt. (Lbs) 123.4 132 130.9  BMI 21.86 23.38 23.19      Physical Exam  Constitutional: She is oriented to person, place, and time. She appears well-developed and well-nourished.  Ill looking  Cardiovascular: Normal rate, normal heart sounds and intact distal pulses.  No murmur heard. Pulmonary/Chest: Effort normal and breath sounds normal. She has no wheezes. She has no rales. She exhibits no tenderness.  Abdominal: Soft. Bowel sounds are normal. She exhibits no distension and no mass. There is no tenderness.  Musculoskeletal: Normal range of motion.  Neurological: She is alert and oriented to person, place, and time.  Psychiatric: She has a normal mood and  affect.     Assessment & Plan:   1. Viral illness Treat presumptively for the flu Provided a work note for the patient who will be out for the next 48 hours. - Influenza A and B, RT PCR - Respiratory virus panel - oseltamivir (TAMIFLU) 75 MG capsule; Take 1 capsule (75 mg total) by mouth 2 (two) times daily.  Dispense: 10 capsule; Refill: 0   Meds ordered this encounter  Medications  . oseltamivir (TAMIFLU) 75 MG  capsule    Sig: Take 1 capsule (75 mg total) by mouth 2 (two) times daily.    Dispense:  10 capsule    Refill:  0    Follow-up: Return for follow up of chronic medical conditions, keep previously scheduled appointment.Hoy Register MD

## 2017-11-15 ENCOUNTER — Telehealth: Payer: Self-pay | Admitting: Family Medicine

## 2017-11-15 NOTE — Telephone Encounter (Signed)
Letter is ready for pick-up.

## 2017-11-15 NOTE — Telephone Encounter (Signed)
Patient wants a note for work that puts her out untill Monday. Please fu with patient at [phone]

## 2017-11-15 NOTE — Telephone Encounter (Signed)
Will route to PCP 

## 2017-11-16 LAB — RESPIRATORY VIRUS PANEL
Adenovirus: NEGATIVE
Influenza A: NEGATIVE
Influenza B: NEGATIVE
Metapneumovirus: NEGATIVE
Parainfluenza 1: NEGATIVE
Parainfluenza 2: NEGATIVE
Parainfluenza 3: NEGATIVE
Respiratory Syncytial Virus A: NEGATIVE
Respiratory Syncytial Virus B: NEGATIVE
Rhinovirus: NEGATIVE

## 2017-11-18 ENCOUNTER — Encounter (HOSPITAL_COMMUNITY): Payer: Self-pay | Admitting: Emergency Medicine

## 2017-11-18 ENCOUNTER — Other Ambulatory Visit: Payer: Self-pay

## 2017-11-18 ENCOUNTER — Ambulatory Visit (HOSPITAL_COMMUNITY)
Admission: EM | Admit: 2017-11-18 | Discharge: 2017-11-18 | Disposition: A | Discharge status: Home / Self Care | Payer: Medicaid Other | Attending: Internal Medicine | Admitting: Internal Medicine

## 2017-11-18 DIAGNOSIS — H9202 Otalgia, left ear: Secondary | ICD-10-CM

## 2017-11-18 LAB — INFLUENZA A AND B, RT PCR
Influenza A PCR: NEGATIVE
Influenza B PCR: NEGATIVE

## 2017-11-18 MED ORDER — AMOXICILLIN 875 MG PO TABS: 875.0000 mg | ORAL_TABLET | Freq: Two times a day (BID) | ORAL | 0 refills | Status: AC

## 2017-11-18 MED ORDER — OFLOXACIN 0.3 % OP SOLN: OPHTHALMIC | 0 refills | Status: DC

## 2017-11-18 NOTE — ED Triage Notes (Signed)
Was cleaning left ear with qtip on Sunday, heard and felt pop in left ear and its hurting and draining and unable to hear in left ear.

## 2017-11-18 NOTE — Discharge Instructions (Signed)
Blood seen in ear with possible rupture. Start amoxicillin as directed.  Start ofloxacin eardrop as directed.  Follow-up with PCP/ENT for further evaluation and treatment needed.

## 2017-11-18 NOTE — Telephone Encounter (Signed)
Patient was called and informed that letter is ready for pick up. Patient states that she will try and print the letter out for mychart.

## 2017-11-18 NOTE — ED Provider Notes (Signed)
[facility]    CSN: [id number] Arrival date & time: 11/18/17  1553     History   Chief Complaint Chief Complaint  Patient presents with  . Otalgia    HPI Kelsey Joyce is a 32 y.o. female.   32 year old female comes in for 1 day history of left ear pain.  States she was cleaning her ear with a cotton swab, went too deep, heard a pop in her ear with pain.  Ear has been draining since then.  She has also had decrease in hearing of the ear.  Has not done anything for the symptoms.      Past Medical History:  Diagnosis Date  . Anxiety   . Hypertension     Patient Active Problem List   Diagnosis Date Noted  . 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 Cold sore 11/16/2016  . Sinus pressure 11/01/2016  . LGSIL on Pap smear of cervix 08/04/2015  . Esophageal reflux 04/25/2015  . Smoking 04/25/2015  . Family history of diabetes mellitus (DM) 04/25/2015  . HYPERTENSION, BENIGN ESSENTIAL 07/08/2009    History reviewed. No pertinent surgical history.  OB History    Gravida Para Term Preterm AB Living   0 0 0 0 0 0   SAB TAB Ectopic Multiple Live Births   0 0 0 0 0       Home Medications    Prior to Admission medications   Medication Sig Start Date End Date Taking? Authorizing Provider  amLODipine (NORVASC) 10 MG tablet Take 1 tablet (10 mg total) by mouth daily. 08/27/17   Hoy RegisterNewlin, Enobong, MD  amoxicillin (AMOXIL) 875 MG tablet Take 1 tablet (875 mg total) by mouth 2 (two) times daily for 7 days. 11/18/17 11/25/17  Belinda FisherYu, Anabela Crayton V, PA-C  cetirizine (ZYRTEC) 10 MG tablet Take 1 tablet (10 mg total) by mouth daily. 08/27/17   Hoy RegisterNewlin, Enobong, MD  chlorthalidone (HYGROTON) 25 MG tablet Take 1 tablet (25 mg total) by mouth daily. 08/27/17   Hoy RegisterNewlin, Enobong, MD  fluticasone (FLONASE) 50 MCG/ACT nasal spray Place 2 sprays into both nostrils daily. 08/27/17   Hoy RegisterNewlin, Enobong, MD  ibuprofen (ADVIL,MOTRIN) 600 MG tablet Take 1 tablet (600 mg  total) by mouth every 12 (twelve) hours as needed for moderate pain. 08/27/17   Hoy RegisterNewlin, Enobong, MD  ofloxacin (OCUFLOX) 0.3 % ophthalmic solution 10 drops into left ear for 7 days 11/18/17   Belinda FisherYu, Kashayla Ungerer V, PA-C  oseltamivir (TAMIFLU) 75 MG capsule Take 1 capsule (75 mg total) by mouth 2 (two) times daily. 11/13/17   Hoy RegisterNewlin, Enobong, MD  potassium chloride SA (K-DUR,KLOR-CON) 10 MEQ tablet Take 1 tablet (10 mEq total) by mouth daily. Patient not taking: Reported on 08/27/2017 05/30/17   Hoy RegisterNewlin, Enobong, MD    Family History Family History  Problem Relation Age of Onset  . Hypertension Mother   . Cervical cancer Unknown 60       Grandmother  . Hypertension Paternal Aunt   . Breast cancer Unknown 39       Aunt  . Breast cancer Unknown        Great Aunt   . Cancer Maternal Grandmother        cerival    Social History Social History   Tobacco Use  . Smoking status: Current Some Day Smoker    Packs/day: 0.20    Types: Cigarettes  . Smokeless tobacco: Never Used  . Tobacco comment: smoking 1-2 cigs per day  Substance Use  Topics  . Alcohol use: Yes    Alcohol/week: 0.0 oz    Comment: occassionally  . Drug use: No     Allergies   Acetaminophen and Asa [aspirin]   Review of Systems Review of Systems  Reason unable to perform ROS: See HPI as above.     Physical Exam Triage Vital Signs ED Triage Vitals  Enc Vitals Group     BP 11/18/17 1730 (!) 128/91     Pulse Rate 11/18/17 1730 81     Resp 11/18/17 1730 18     Temp 11/18/17 1730 98.2 F (36.8 C)     Temp Source 11/18/17 1730 Oral     SpO2 11/18/17 1730 99 %     Weight --      Height --      Head Circumference --      Peak Flow --      Pain Score 11/18/17 1725 7     Pain Loc --      Pain Edu? --      Excl. in GC? --    No data found.  Updated Vital Signs BP (!) 128/91 (BP Location: Left Arm)   Pulse 81   Temp 98.2 F (36.8 C) (Oral)   Resp 18   SpO2 99%   Physical Exam  Constitutional: She is oriented to  person, place, and time. She appears well-developed and well-nourished. No distress.  HENT:  Head: Normocephalic and atraumatic.  Right Ear: Tympanic membrane, external ear and ear canal normal. Tympanic membrane is not erythematous and not bulging.  Left Ear: External ear and ear canal normal.  Blood on the left tympanic membrane at 2:00 and 4:00 region, with possible perforation of the TM.  Bulging of the rest of the tympanic membrane.  Eyes: Conjunctivae are normal. Pupils are equal, round, and reactive to light.  Neurological: She is alert and oriented to person, place, and time.    UC Treatments / Results  Labs (all labs ordered are listed, but only abnormal results are displayed) Labs Reviewed - No data to display  EKG  EKG Interpretation None       Radiology No results found.  Procedures Procedures (including critical care time)  Medications Ordered in UC Medications - No data to display   Initial Impression / Assessment and Plan / UC Course  I have reviewed the triage vital signs and the nursing notes.  Pertinent labs & imaging results that were available during my care of the patient were reviewed by me and considered in my medical decision making (see chart for details).    Left ear pain after cleaning with Q-tip, with possible perforation of the TM.  Start amoxicillin as directed.  Ofloxacin as directed for the ear.  Follow-up with PCP/ENT for further evaluation and management needed.  Patient expresses understanding and agrees to plan.  Final Clinical Impressions(s) / UC Diagnoses   Final diagnoses:  Left ear pain    ED Discharge Orders        Ordered    amoxicillin (AMOXIL) 875 MG tablet  2 times daily     11/18/17 1803    ofloxacin (OCUFLOX) 0.3 % ophthalmic solution     11/18/17 1803         Belinda Fisher, PA-C 11/18/17 1807

## 2017-11-19 MED FILL — OFLOXACIN 0.3% EYE DROPS: 0.3 | 7 days supply | Qty: 10 | Fill #0

## 2017-11-19 MED FILL — AMOXICILLIN 875 MG TABLET: 875 | 7 days supply | Qty: 14 | Fill #0

## 2017-11-26 MED FILL — AMLODIPINE BESYLATE 10 MG T: 10 | 30 days supply | Qty: 30 | Fill #0

## 2017-11-26 MED FILL — ?CETIRIZINE HCL 10 MG TABLE: 10 | 30 days supply | Qty: 30 | Fill #0

## 2017-11-26 MED FILL — ?CHLORTHALIDONE 25 MG TABLE: 25 | 30 days supply | Qty: 30 | Fill #0

## 2017-12-09 ENCOUNTER — Ambulatory Visit (INDEPENDENT_AMBULATORY_CARE_PROVIDER_SITE_OTHER): Payer: Self-pay | Admitting: Otolaryngology

## 2017-12-09 DIAGNOSIS — H9072 Mixed conductive and sensorineural hearing loss, unilateral, left ear, with unrestricted hearing on the contralateral side: Secondary | ICD-10-CM

## 2017-12-09 DIAGNOSIS — H7202 Central perforation of tympanic membrane, left ear: Secondary | ICD-10-CM

## 2017-12-09 DIAGNOSIS — H903 Sensorineural hearing loss, bilateral: Secondary | ICD-10-CM

## 2017-12-19 ENCOUNTER — Telehealth: Payer: Self-pay | Admitting: Family Medicine

## 2017-12-19 NOTE — Telephone Encounter (Signed)
4 page, Paperwork received through fax 12-19-17. (Dr.s Notes)

## 2017-12-26 MED FILL — CHLORTHALIDONE 25 MG TABLET: 25 | 30 days supply | Qty: 30 | Fill #1

## 2017-12-26 MED FILL — AMLODIPINE BESYLATE 10 MG T: 10 | 30 days supply | Qty: 30 | Fill #1

## 2018-01-23 ENCOUNTER — Ambulatory Visit (INDEPENDENT_AMBULATORY_CARE_PROVIDER_SITE_OTHER): Payer: Medicaid Other | Admitting: Otolaryngology

## 2018-01-23 DIAGNOSIS — H903 Sensorineural hearing loss, bilateral: Secondary | ICD-10-CM

## 2018-01-27 MED FILL — AMLODIPINE BESYLATE 10 MG T: 10 | 30 days supply | Qty: 30 | Fill #2

## 2018-02-11 ENCOUNTER — Encounter: Payer: Self-pay | Admitting: Family Medicine

## 2018-02-11 ENCOUNTER — Ambulatory Visit: Payer: Self-pay | Attending: Family Medicine | Admitting: Family Medicine

## 2018-02-11 DIAGNOSIS — I10 Essential (primary) hypertension: Secondary | ICD-10-CM

## 2018-02-11 DIAGNOSIS — F419 Anxiety disorder, unspecified: Secondary | ICD-10-CM | POA: Insufficient documentation

## 2018-02-11 DIAGNOSIS — J302 Other seasonal allergic rhinitis: Secondary | ICD-10-CM

## 2018-02-11 DIAGNOSIS — Z79899 Other long term (current) drug therapy: Secondary | ICD-10-CM | POA: Insufficient documentation

## 2018-02-11 DIAGNOSIS — Z886 Allergy status to analgesic agent status: Secondary | ICD-10-CM | POA: Insufficient documentation

## 2018-02-11 DIAGNOSIS — R87613 High grade squamous intraepithelial lesion on cytologic smear of cervix (HGSIL): Secondary | ICD-10-CM | POA: Insufficient documentation

## 2018-02-11 MED ORDER — CHLORTHALIDONE 25 MG PO TABS: 25.0000 mg | ORAL_TABLET | Freq: Every day | ORAL | 6 refills | Status: DC

## 2018-02-11 MED ORDER — CETIRIZINE HCL 10 MG PO TABS: 10.0000 mg | ORAL_TABLET | Freq: Every day | ORAL | 6 refills | Status: DC

## 2018-02-11 MED ORDER — AMLODIPINE BESYLATE 10 MG PO TABS: 10.0000 mg | ORAL_TABLET | Freq: Every day | ORAL | 6 refills | Status: DC

## 2018-02-11 NOTE — Progress Notes (Signed)
Subjective:  Patient ID: Kelsey Joyce, female    DOB: 05/20/1986  Age: 32 y.o. MRN: [id number]  CC: Hypertension   HPI Kelsey Joyce is a 32 year old female with a history of hypertension who presents today for follow-up visit.  She reports compliance with amlodipine and chlorthalidone and denies adverse effects. She denies chest pains or shortness of breath. She suffers from allergies and takes Zyrtec which helps her symptoms. She has a history of HGSIL status post LEEP in 06/21/2017 and was advised by GYN to have a repeat Pap smear in 06/2018.  Past Medical History:  Diagnosis Date  . Anxiety   . Hypertension     No past surgical history on file.  Allergies  Allergen Reactions  . Acetaminophen Nausea Only  . Asa [Aspirin] Nausea Only     Outpatient Medications Prior to Visit  Medication Sig Dispense Refill  . fluticasone (FLONASE) 50 MCG/ACT nasal spray Place 2 sprays into both nostrils daily. 16 g 6  . ibuprofen (ADVIL,MOTRIN) 600 MG tablet Take 1 tablet (600 mg total) by mouth every 12 (twelve) hours as needed for moderate pain. 30 tablet 0  . amLODipine (NORVASC) 10 MG tablet Take 1 tablet (10 mg total) by mouth daily. 30 tablet 6  . cetirizine (ZYRTEC) 10 MG tablet Take 1 tablet (10 mg total) by mouth daily. 30 tablet 6  . chlorthalidone (HYGROTON) 25 MG tablet Take 1 tablet (25 mg total) by mouth daily. 30 tablet 6  . ofloxacin (OCUFLOX) 0.3 % ophthalmic solution 10 drops into left ear for 7 days (Patient not taking: Reported on 02/11/2018) 10 mL 0  . oseltamivir (TAMIFLU) 75 MG capsule Take 1 capsule (75 mg total) by mouth 2 (two) times daily. (Patient not taking: Reported on 02/11/2018) 10 capsule 0  . potassium chloride SA (K-DUR,KLOR-CON) 10 MEQ tablet Take 1 tablet (10 mEq total) by mouth daily. (Patient not taking: Reported on 08/27/2017) 30 tablet 2   No facility-administered medications prior to visit.     ROS Review of Systems  Constitutional: Negative for  activity change, appetite change and fatigue.  HENT: Negative for congestion, sinus pressure and sore throat.   Eyes: Negative for visual disturbance.  Respiratory: Negative for cough, chest tightness, shortness of breath and wheezing.   Cardiovascular: Negative for chest pain and palpitations.  Gastrointestinal: Negative for abdominal distention, abdominal pain and constipation.  Endocrine: Negative for polydipsia.  Genitourinary: Negative for dysuria and frequency.  Musculoskeletal: Negative for arthralgias and back pain.  Skin: Negative for rash.  Neurological: Negative for tremors, light-headedness and numbness.  Hematological: Does not bruise/bleed easily.  Psychiatric/Behavioral: Negative for agitation and behavioral problems.    Objective:  BP 114/74   Pulse 82   Temp 98 F (36.7 C) (Oral)   Ht 5' 3" (1.6 m)   Wt 133 lb 12.8 oz (60.7 kg)   SpO2 100%   BMI 23.70 kg/m   BP/Weight 02/11/2018 10/28/9756 05/10/2548  Systolic BP 826 415 830  Diastolic BP 74 91 81  Wt. (Lbs) 133.8 - 123.4  BMI 23.7 - 21.86      Physical Exam  Constitutional: She is oriented to person, place, and time. She appears well-developed and well-nourished.  Cardiovascular: Normal rate, normal heart sounds and intact distal pulses.  No murmur heard. Pulmonary/Chest: Effort normal and breath sounds normal. She has no wheezes. She has no rales. She exhibits no tenderness.  Abdominal: Soft. Bowel sounds are normal. She exhibits no distension and no mass. There is  no tenderness.  Musculoskeletal: Normal range of motion.  Neurological: She is alert and oriented to person, place, and time.  Skin: Skin is warm and dry.  Psychiatric: She has a normal mood and affect.     CMP Latest Ref Rng & Units 08/27/2017 05/28/2017 11/01/2016  Glucose 65 - 99 mg/dL 75 96 88  BUN 6 - 20 mg/dL _0 Creatinine 0.57 - 1.00 mg/dL 0.80 0.74 0.78  Sodium 134 - 144 mmol/L 140 138 139  Potassium 3.5 - 5.2 mmol/L 4.1 3.3(L)  4.4  Chloride 96 - 106 mmol/L 102 99 105  CO2 20 - 29 mmol/L _1 Calcium 8.7 - 10.2 mg/dL 9.0 9.1 9.0  Total Protein 6.1 - 8.1 g/dL - - 7.1  Total Bilirubin 0.2 - 1.2 mg/dL - - 0.3  Alkaline Phos 33 - 115 U/L - - 60  AST 10 - 30 U/L - - 15  ALT 6 - 29 U/L - - 8    Lipid Panel     Component Value Date/Time   CHOL 126 10/14/2009 2108   TRIG 47 10/14/2009 2108   HDL 40 10/14/2009 2108   CHOLHDL 3.2 Ratio 10/14/2009 2108   VLDL 9 10/14/2009 2108   LDLCALC 77 10/14/2009 2108     Assessment & Plan:   1. Essential hypertension Controlled Counseled on blood pressure goal of less than 130/80, low-sodium, DASH diet, medication compliance, 150 minutes of moderate intensity exercise per week. Discussed medication compliance, adverse effects. - CMP14+EGFR - Lipid panel - amLODipine (NORVASC) 10 MG tablet; Take 1 tablet (10 mg total) by mouth daily.  Dispense: 30 tablet; Refill: 6 - chlorthalidone (HYGROTON) 25 MG tablet; Take 1 tablet (25 mg total) by mouth daily.  Dispense: 30 tablet; Refill: 6  2. Seasonal allergies Stable - cetirizine (ZYRTEC) 10 MG tablet; Take 1 tablet (10 mg total) by mouth daily.  Dispense: 30 tablet; Refill: 6   Meds ordered this encounter  Medications  . amLODipine (NORVASC) 10 MG tablet    Sig: Take 1 tablet (10 mg total) by mouth daily.    Dispense:  30 tablet    Refill:  6  . cetirizine (ZYRTEC) 10 MG tablet    Sig: Take 1 tablet (10 mg total) by mouth daily.    Dispense:  30 tablet    Refill:  6  . chlorthalidone (HYGROTON) 25 MG tablet    Sig: Take 1 tablet (25 mg total) by mouth daily.    Dispense:  30 tablet    Refill:  6    Follow-up: Return in about 4 months (around 06/14/2018) for Pap smear.   Charlott Rakes MD

## 2018-02-12 LAB — CMP14+EGFR
ALT: 9 IU/L (ref 0–32)
AST: 14 IU/L (ref 0–40)
Albumin/Globulin Ratio: 1.7 (ref 1.2–2.2)
Albumin: 4.1 g/dL (ref 3.5–5.5)
Alkaline Phosphatase: 52 IU/L (ref 39–117)
BUN/Creatinine Ratio: 11 (ref 9–23)
BUN: 8 mg/dL (ref 6–20)
Bilirubin Total: <0.2 mg/dL (ref 0.0–1.2)
CO2: 23 mmol/L (ref 20–29)
Calcium: 8.7 mg/dL (ref 8.7–10.2)
Chloride: 106 mmol/L (ref 96–106)
Creatinine, Ser: 0.76 mg/dL (ref 0.57–1.00)
GFR calc Af Amer: 120 mL/min/{1.73_m2} (ref >59)
GFR calc non Af Amer: 104 mL/min/{1.73_m2} (ref >59)
Globulin, Total: 2.4 g/dL (ref 1.5–4.5)
Glucose: 97 mg/dL (ref 65–99)
Potassium: 4.1 mmol/L (ref 3.5–5.2)
Sodium: 141 mmol/L (ref 134–144)
Total Protein: 6.5 g/dL (ref 6.0–8.5)

## 2018-02-12 LAB — LIPID PANEL
Chol/HDL Ratio: 2.3 ratio (ref 0.0–4.4)
Cholesterol, Total: 117 mg/dL (ref 100–199)
HDL: 52 mg/dL (ref >39)
LDL Calculated: 57 mg/dL (ref 0–99)
Triglycerides: 42 mg/dL (ref 0–149)
VLDL Cholesterol Cal: 8 mg/dL (ref 5–40)

## 2018-02-27 MED FILL — CHLORTHALIDONE 25 MG TABLET: 25 | 30 days supply | Qty: 30 | Fill #2

## 2018-02-27 MED FILL — AMLODIPINE BESYLATE 10 MG T: 10 | 30 days supply | Qty: 30 | Fill #3

## 2018-03-27 MED FILL — CHLORTHALIDONE 25 MG TAB: 25 | 30 days supply | Qty: 30 | Fill #3

## 2018-03-27 MED FILL — AMLODIPINE BESYLATE 10 MG T: 10 | 30 days supply | Qty: 30 | Fill #4

## 2018-04-08 ENCOUNTER — Encounter (HOSPITAL_COMMUNITY): Payer: Self-pay

## 2018-04-08 ENCOUNTER — Other Ambulatory Visit: Payer: Self-pay

## 2018-04-08 ENCOUNTER — Inpatient Hospital Stay (HOSPITAL_COMMUNITY)
Admission: AD | Admit: 2018-04-08 | Discharge: 2018-04-08 | Disposition: A | Discharge status: Home / Self Care | Payer: Self-pay | Source: Ambulatory Visit | Attending: Obstetrics and Gynecology | Admitting: Obstetrics and Gynecology

## 2018-04-08 ENCOUNTER — Inpatient Hospital Stay (HOSPITAL_COMMUNITY): Payer: Self-pay

## 2018-04-08 DIAGNOSIS — K8020 Calculus of gallbladder without cholecystitis without obstruction: Secondary | ICD-10-CM | POA: Insufficient documentation

## 2018-04-08 DIAGNOSIS — Z8249 Family history of ischemic heart disease and other diseases of the circulatory system: Secondary | ICD-10-CM | POA: Insufficient documentation

## 2018-04-08 DIAGNOSIS — F419 Anxiety disorder, unspecified: Secondary | ICD-10-CM | POA: Insufficient documentation

## 2018-04-08 DIAGNOSIS — F17210 Nicotine dependence, cigarettes, uncomplicated: Secondary | ICD-10-CM | POA: Insufficient documentation

## 2018-04-08 DIAGNOSIS — Z79899 Other long term (current) drug therapy: Secondary | ICD-10-CM | POA: Insufficient documentation

## 2018-04-08 DIAGNOSIS — K824 Cholesterolosis of gallbladder: Secondary | ICD-10-CM

## 2018-04-08 DIAGNOSIS — R109 Unspecified abdominal pain: Secondary | ICD-10-CM

## 2018-04-08 DIAGNOSIS — R1013 Epigastric pain: Secondary | ICD-10-CM | POA: Insufficient documentation

## 2018-04-08 DIAGNOSIS — I10 Essential (primary) hypertension: Secondary | ICD-10-CM | POA: Insufficient documentation

## 2018-04-08 LAB — CBC WITH DIFFERENTIAL/PLATELET
Basophils Absolute: 0 10*3/uL (ref 0.0–0.1)
Basophils Relative: 0 %
Eosinophils Absolute: 0 10*3/uL (ref 0.0–0.7)
Eosinophils Relative: 1 %
HCT: 38.4 % (ref 36.0–46.0)
Hemoglobin: 13.3 g/dL (ref 12.0–15.0)
Lymphocytes Relative: 49 %
Lymphs Abs: 4.3 10*3/uL — ABNORMAL HIGH (ref 0.7–4.0)
MCH: 29.9 pg (ref 26.0–34.0)
MCHC: 34.6 g/dL (ref 30.0–36.0)
MCV: 86.3 fL (ref 78.0–100.0)
Monocytes Absolute: 0.4 10*3/uL (ref 0.1–1.0)
Monocytes Relative: 4 %
Neutro Abs: 3.9 10*3/uL (ref 1.7–7.7)
Neutrophils Relative %: 46 %
Platelets: 194 10*3/uL (ref 150–400)
RBC: 4.45 MIL/uL (ref 3.87–5.11)
RDW: 14.4 % (ref 11.5–15.5)
WBC: 8.6 10*3/uL (ref 4.0–10.5)

## 2018-04-08 LAB — URINALYSIS, ROUTINE W REFLEX MICROSCOPIC
Bilirubin Urine: NEGATIVE
Glucose, UA: NEGATIVE mg/dL
Hgb urine dipstick: NEGATIVE
Ketones, ur: NEGATIVE mg/dL
Leukocytes, UA: NEGATIVE
Nitrite: NEGATIVE
Protein, ur: NEGATIVE mg/dL
Specific Gravity, Urine: 1.004 — ABNORMAL LOW (ref 1.005–1.030)
pH: 6 (ref 5.0–8.0)

## 2018-04-08 LAB — COMPREHENSIVE METABOLIC PANEL
ALT: 47 U/L — ABNORMAL HIGH (ref 0–44)
AST: 26 U/L (ref 15–41)
Albumin: 4 g/dL (ref 3.5–5.0)
Alkaline Phosphatase: 70 U/L (ref 38–126)
Anion gap: 10 (ref 5–15)
BUN: 9 mg/dL (ref 6–20)
CO2: 27 mmol/L (ref 22–32)
Calcium: 9.5 mg/dL (ref 8.9–10.3)
Chloride: 101 mmol/L (ref 98–111)
Creatinine, Ser: 0.81 mg/dL (ref 0.44–1.00)
GFR calc Af Amer: >60 mL/min (ref >60)
GFR calc non Af Amer: >60 mL/min (ref >60)
Glucose, Bld: 89 mg/dL (ref 70–99)
Potassium: 3.9 mmol/L (ref 3.5–5.1)
Sodium: 138 mmol/L (ref 135–145)
Total Bilirubin: 0.3 mg/dL (ref 0.3–1.2)
Total Protein: 7.7 g/dL (ref 6.5–8.1)

## 2018-04-08 LAB — AMYLASE: Amylase: 70 U/L (ref 28–100)

## 2018-04-08 LAB — POCT PREGNANCY, URINE: Preg Test, Ur: NEGATIVE

## 2018-04-08 LAB — LIPASE, BLOOD: Lipase: 40 U/L (ref 11–51)

## 2018-04-08 MED ORDER — GI COCKTAIL ~~LOC~~
30.0000 mL | Freq: Once | ORAL | Status: AC
  Administered 2018-04-08: 30 mL via ORAL
  Filled 2018-04-08: qty 30

## 2018-04-08 NOTE — MAU Provider Note (Addendum)
History     CSN: [id number]  Arrival date and time: 04/08/18 1309   First Provider Initiated Contact with Patient 04/08/18 1544      Chief Complaint  Patient presents with  . Abdominal Pain   HPI Kelsey Joyce 32 y.o.  Client had severe epigastric pain that radiated to the center of her back near her spine on Sunday night.  Awoke out of sleep suddenly and was in severe pain.  Had sweating and vomiting and was able to drift off to sleep again.  Awakened again, and the cycle repeated.  Then the pain resolved until this morning when she ate 2 bites of sausage, egg and cheese biscuit and the pain came back - 10/10.  Currently pain is now 4/10.  Saw GI for workup over 2 years ago that did no show any problems - looked for notes but none available in Epic.  Has not had this problem in 2 years.  OB History    Gravida  0   Para  0   Term  0   Preterm  0   AB  0   Living  0     SAB  0   TAB  0   Ectopic  0   Multiple  0   Live Births  0           Past Medical History:  Diagnosis Date  . Anxiety   . Hypertension     Past Surgical History:  Procedure Laterality Date  . LEEP      Family History  Problem Relation Age of Onset  . Hypertension Mother   . Cervical cancer Unknown 8       Grandmother  . Hypertension Paternal Aunt   . Breast cancer Unknown 39       Aunt  . Breast cancer Unknown        Great Aunt   . Cancer Maternal Grandmother        cerival    Social History   Tobacco Use  . Smoking status: Current Some Day Smoker    Packs/day: 0.20    Types: Cigarettes  . Smokeless tobacco: Never Used  . Tobacco comment: smoking 1-2 cigs per day  Substance Use Topics  . Alcohol use: Yes    Alcohol/week: 0.0 oz    Comment: occassionally  . Drug use: No    Allergies:  Allergies  Allergen Reactions  . Acetaminophen Nausea Only  . Asa [Aspirin] Nausea Only    Medications Prior to Admission  Medication Sig Dispense Refill Last Dose  .  amLODipine (NORVASC) 10 MG tablet Take 1 tablet (10 mg total) by mouth daily. 30 tablet 6   . cetirizine (ZYRTEC) 10 MG tablet Take 1 tablet (10 mg total) by mouth daily. 30 tablet 6   . chlorthalidone (HYGROTON) 25 MG tablet Take 1 tablet (25 mg total) by mouth daily. 30 tablet 6   . fluticasone (FLONASE) 50 MCG/ACT nasal spray Place 2 sprays into both nostrils daily. 16 g 6 Taking  . ibuprofen (ADVIL,MOTRIN) 600 MG tablet Take 1 tablet (600 mg total) by mouth every 12 (twelve) hours as needed for moderate pain. 30 tablet 0 Taking    Review of Systems  Constitutional: Positive for diaphoresis. Negative for fever.  Gastrointestinal: Positive for nausea and vomiting. Negative for abdominal distention, constipation and diarrhea.       Upper abdominal pain  Genitourinary: Negative for dysuria, vaginal bleeding and vaginal discharge.   Physical Exam  Blood pressure 121/85, pulse 68, temperature 98.5 F (36.9 C), temperature source Oral, resp. rate 17, weight 132 lb 8 oz (60.1 kg), last menstrual period 03/31/2018, SpO2 100 %.  Physical Exam  Nursing note and vitals reviewed. Constitutional: She is oriented to person, place, and time. She appears well-developed and well-nourished.  HENT:  Head: Normocephalic.  Eyes: EOM are normal.  Neck: Neck supple.  Cardiovascular: Normal rate.  Respiratory: Effort normal.  GI: Soft. Bowel sounds are normal. There is no tenderness. There is no rebound and no guarding.  Epigastric tenderness, some RUQ tenderness, pain radiates to center of back over spine between shoulder blades.  Musculoskeletal: Normal range of motion.  Neurological: She is alert and oriented to person, place, and time.  Skin: Skin is warm and dry.  Psychiatric: She has a normal mood and affect.    MAU Course  Procedures  Results for orders placed or performed during the hospital encounter of 04/08/18 (from the past 48 hour(s))  Urinalysis, Routine w reflex microscopic      Status: Abnormal   Collection Time: 04/08/18  1:50 PM  Result Value Ref Range   Color, Urine STRAW (A) YELLOW   APPearance CLEAR CLEAR   Specific Gravity, Urine 1.004 (L) 1.005 - 1.030   pH 6.0 5.0 - 8.0   Glucose, UA NEGATIVE NEGATIVE mg/dL   Hgb urine dipstick NEGATIVE NEGATIVE   Bilirubin Urine NEGATIVE NEGATIVE   Ketones, ur NEGATIVE NEGATIVE mg/dL   Protein, ur NEGATIVE NEGATIVE mg/dL   Nitrite NEGATIVE NEGATIVE   Leukocytes, UA NEGATIVE NEGATIVE    Comment: Performed at [facility], [address], [place], [place] [address]  Pregnancy, urine POC     Status: None   Collection Time: 04/08/18  2:40 PM  Result Value Ref Range   Preg Test, Ur NEGATIVE NEGATIVE    Comment:        THE SENSITIVITY OF THIS METHODOLOGY IS >24 mIU/mL   Lipase, blood     Status: None   Collection Time: 04/08/18  3:21 PM  Result Value Ref Range   Lipase 40 11 - 51 U/L    Comment: Performed at [facility], [address], [place], [place] [address]  Amylase     Status: None   Collection Time: 04/08/18  3:21 PM  Result Value Ref Range   Amylase 70 28 - 100 U/L    Comment: Performed at [facility], [address] [place], [place] [address]  Comprehensive metabolic panel     Status: Abnormal   Collection Time: 04/08/18  3:21 PM  Result Value Ref Range   Sodium 138 135 - 145 mmol/L   Potassium 3.9 3.5 - 5.1 mmol/L   Chloride 101 98 - 111 mmol/L    Comment: Please note change in reference range.   CO2 27 22 - 32 mmol/L   Glucose, Bld 89 70 - 99 mg/dL    Comment: Please note change in reference range.   BUN 9 6 - 20 mg/dL    Comment: Please note change in reference range.   Creatinine, Ser 0.81 0.44 - 1.00 mg/dL   Calcium 9.5 8.9 - 10.3 mg/dL   Total Protein 7.7 6.5 - 8.1 g/dL   Albumin 4.0 3.5 - 5.0 g/dL   AST 26 15 - 41 U/L   ALT 47 (H) 0 - 44 U/L    Comment: Please note change in reference range.   Alkaline Phosphatase 70 38 - 126 U/L   Total Bilirubin 0.3  0.3 - 1.2 mg/dL    GFR calc non Af Amer >60 >60 mL/min   GFR calc Af Amer >60 >60 mL/min    Comment: (NOTE) The eGFR has been calculated using the CKD EPI equation. This calculation has not been validated in all clinical situations. eGFR's persistently <60 mL/min signify possible Chronic Kidney Disease.    Anion gap 10 5 - 15    Comment: Performed at [facility], [address], [place], [place] [address]  CBC with Differential/Platelet     Status: Abnormal   Collection Time: 04/08/18  3:21 PM  Result Value Ref Range   WBC 8.6 4.0 - 10.5 K/uL   RBC 4.45 3.87 - 5.11 MIL/uL   Hemoglobin 13.3 12.0 - 15.0 g/dL   HCT 38.4 36.0 - 46.0 %   MCV 86.3 78.0 - 100.0 fL   MCH 29.9 26.0 - 34.0 pg   MCHC 34.6 30.0 - 36.0 g/dL   RDW 14.4 11.5 - 15.5 %   Platelets 194 150 - 400 K/uL   Neutrophils Relative % 46 %   Neutro Abs 3.9 1.7 - 7.7 K/uL   Lymphocytes Relative 49 %   Lymphs Abs 4.3 (H) 0.7 - 4.0 K/uL   Monocytes Relative 4 %   Monocytes Absolute 0.4 0.1 - 1.0 K/uL   Eosinophils Relative 1 %   Eosinophils Absolute 0.0 0.0 - 0.7 K/uL   Basophils Relative 0 %   Basophils Absolute 0.0 0.0 - 0.1 K/uL    Comment: Performed at [facility], [address], [place], [place] [address]   See RUQ [place]  - results reviewed with client   MDM Slightly elevated ALT and higher than levels a few months ago.  Pain seems colicy by description and while today's pain is likely due to high fat breakfast, pain on Sunday seems unrelated to dietary intake.  Will do RUQ [place] to rule out gallstones as cause of her pain.  Assessment and Plan  Gallstone polyp  Plan Follow up with your PCP and discuss the polyps.  Due to the size of 5 mm it is likely benign but will need follow up in one year.  If symptoms of pain keep coming, may need to consider surgical consult. Given info on low fat diet to decrease episodes. Advised future care at [facility] or [facility].  Terri L Burleson 04/08/2018, 3:44 PM

## 2018-04-08 NOTE — Discharge Instructions (Signed)
Follow up with your doctor and make sure they are aware that you have been diagnosed with gall bladder polyp.  Yours is 5 mm in diameter and usually conservative management, watch and wait, is the treatment unless you are having problems with abdominal pain. Eat a low fat diet.

## 2018-04-08 NOTE — MAU Note (Addendum)
Here today because she is not sure if she has kidney stones,after asking where pain is and symptoms, believe she meant gallbladder  Has been to a GI doc a few years ago.  Pain came on Sunday, hadn't had it in a year, pain woke her. Eased off after she threw up.  Had an episode this morning, mid upper abd/epigastric through to back. No fever, has had appendix removed.

## 2018-04-28 MED FILL — AMLODIPINE BESYLATE 10 MG T: 10 | 30 days supply | Qty: 30 | Fill #5

## 2018-06-02 MED FILL — AMLODIPINE BESYLATE 10 MG T: 10 | 30 days supply | Qty: 30 | Fill #6

## 2018-06-02 MED FILL — CHLORTHALIDONE 25 MG TAB: 25 | 30 days supply | Qty: 30 | Fill #4

## 2018-06-16 ENCOUNTER — Other Ambulatory Visit: Payer: Medicaid Other | Admitting: Family Medicine

## 2018-06-24 ENCOUNTER — Encounter: Payer: Self-pay | Admitting: Family Medicine

## 2018-06-24 ENCOUNTER — Ambulatory Visit: Payer: Self-pay | Attending: Family Medicine | Admitting: Family Medicine

## 2018-06-24 VITALS — BP 120/85 | HR 77 | Temp 98.3°F | Ht 63.0 in | Wt 131.0 lb

## 2018-06-24 DIAGNOSIS — I10 Essential (primary) hypertension: Secondary | ICD-10-CM | POA: Insufficient documentation

## 2018-06-24 DIAGNOSIS — K824 Cholesterolosis of gallbladder: Secondary | ICD-10-CM | POA: Insufficient documentation

## 2018-06-24 DIAGNOSIS — Z886 Allergy status to analgesic agent status: Secondary | ICD-10-CM | POA: Insufficient documentation

## 2018-06-24 DIAGNOSIS — Z79899 Other long term (current) drug therapy: Secondary | ICD-10-CM | POA: Insufficient documentation

## 2018-06-24 DIAGNOSIS — Z124 Encounter for screening for malignant neoplasm of cervix: Secondary | ICD-10-CM | POA: Insufficient documentation

## 2018-06-24 MED ORDER — AMLODIPINE BESYLATE 10 MG PO TABS: 10.0000 mg | ORAL_TABLET | Freq: Every day | ORAL | 6 refills | Status: DC

## 2018-06-24 MED ORDER — CHLORTHALIDONE 25 MG PO TABS: 25.0000 mg | ORAL_TABLET | Freq: Every day | ORAL | 6 refills | Status: DC

## 2018-06-24 NOTE — Progress Notes (Signed)
Subjective:  Patient ID: Kelsey Joyce, female    DOB: May 12, 1986  Age: 32 y.o. MRN: [id number]  CC: Gynecologic Exam   HPI Kelsey Joyce is a 32 year old female with a history of hypertension who presents today for follow-up visit.  . She has a history of HGSIL status post LEEP in 06/21/2017 and id due for a repeat PAP smear today. She was recently diagnosed with gall bladder polyp and would like a referral to Gen Surgery due to episodes of intermittent abdominal pain,nausea and vomiting which sometimes has her bent over;pain is absent now.  US abdomen 04/08/18: IMPRESSION: 1. No acute abnormality. No evidence of cholelithiasis, acute cholecystitis or biliary dilatation. 2. Gallbladder polyps She is doing well on her antihypertensives and denies chest pain or dyspnea.  Past Medical History:  Diagnosis Date  . Anxiety   . Hypertension     Past Surgical History:  Procedure Laterality Date  . LEEP      Allergies  Allergen Reactions  . Acetaminophen Nausea Only  . Asa [Aspirin] Nausea Only     Outpatient Medications Prior to Visit  Medication Sig Dispense Refill  . cetirizine (ZYRTEC) 10 MG tablet Take 1 tablet (10 mg total) by mouth daily. 30 tablet 6  . fluticasone (FLONASE) 50 MCG/ACT nasal spray Place 2 sprays into both nostrils daily. 16 g 6  . ibuprofen (ADVIL,MOTRIN) 600 MG tablet Take 1 tablet (600 mg total) by mouth every 12 (twelve) hours as needed for moderate pain. 30 tablet 0  . amLODipine (NORVASC) 10 MG tablet Take 1 tablet (10 mg total) by mouth daily. 30 tablet 6  . chlorthalidone (HYGROTON) 25 MG tablet Take 1 tablet (25 mg total) by mouth daily. 30 tablet 6   No facility-administered medications prior to visit.     ROS Review of Systems  Constitutional: Negative for activity change, appetite change and fatigue.  HENT: Negative for congestion, sinus pressure and sore throat.   Eyes: Negative for visual disturbance.  Respiratory: Negative for  cough, chest tightness, shortness of breath and wheezing.   Cardiovascular: Negative for chest pain and palpitations.  Gastrointestinal: Negative for abdominal distention, abdominal pain and constipation.  Endocrine: Negative for polydipsia.  Genitourinary: Negative for dysuria and frequency.  Musculoskeletal: Negative for arthralgias and back pain.  Skin: Negative for rash.  Neurological: Negative for tremors, light-headedness and numbness.  Hematological: Does not bruise/bleed easily.  Psychiatric/Behavioral: Negative for agitation and behavioral problems.    Objective:  BP 120/85   Pulse 77   Temp 98.3 F (36.8 C) (Oral)   Ht 5\' 3"  (1.6 m)   Wt 131 lb (59.4 kg)   SpO2 100%   BMI 23.21 kg/m   BP/Weight 06/24/2018 04/08/2018 02/11/2018  Systolic BP 120 132 114  Diastolic BP 85 92 74  Wt. (Lbs) 131 132.5 133.8  BMI 23.21 23.47 23.7      Physical Exam  Constitutional: She is oriented to person, place, and time. She appears well-developed and well-nourished.  Cardiovascular: Normal rate, normal heart sounds and intact distal pulses.  No murmur heard. Pulmonary/Chest: Effort normal and breath sounds normal. She has no wheezes. She has no rales. She exhibits no tenderness.  Abdominal: Soft. Bowel sounds are normal. She exhibits no distension and no mass. There is no tenderness.  Genitourinary:  Genitourinary Comments: Normal external genitalia, vagina, cervix  Musculoskeletal: Normal range of motion.  Neurological: She is alert and oriented to person, place, and time.     Assessment & Plan:  1. Screening for cervical cancer Previous h/o HGSIL s/p LEEP - Cytology - PAP([place])  2. Essential hypertension Controlled Counseled on blood pressure goal of less than 130/80, low-sodium, DASH diet, medication compliance, 150 minutes of moderate intensity exercise per week. Discussed medication compliance, adverse effects. - amLODipine (NORVASC) 10 MG tablet; Take 1 tablet  (10 mg total) by mouth daily.  Dispense: 30 tablet; Refill: 6 - chlorthalidone (HYGROTON) 25 MG tablet; Take 1 tablet (25 mg total) by mouth daily.  Dispense: 30 tablet; Refill: 6 - Basic Metabolic Panel  3. Gall bladder polyp Asymptomatic at this time. - Ambulatory referral to General Surgery   Meds ordered this encounter  Medications  . amLODipine (NORVASC) 10 MG tablet    Sig: Take 1 tablet (10 mg total) by mouth daily.    Dispense:  30 tablet    Refill:  6  . chlorthalidone (HYGROTON) 25 MG tablet    Sig: Take 1 tablet (25 mg total) by mouth daily.    Dispense:  30 tablet    Refill:  6    Follow-up: Return in about 6 months (around 12/23/2018) for follow up of chronic medical conditions.   Hoy RegisterEnobong Montez Stryker MD

## 2018-06-25 ENCOUNTER — Encounter: Payer: Self-pay | Admitting: Family Medicine

## 2018-06-25 LAB — BASIC METABOLIC PANEL
BUN/Creatinine Ratio: 8 — ABNORMAL LOW (ref 9–23)
BUN: 6 mg/dL (ref 6–20)
CO2: 24 mmol/L (ref 20–29)
Calcium: 9.1 mg/dL (ref 8.7–10.2)
Chloride: 100 mmol/L (ref 96–106)
Creatinine, Ser: 0.79 mg/dL (ref 0.57–1.00)
GFR calc Af Amer: 115 mL/min/{1.73_m2} (ref >59)
GFR calc non Af Amer: 99 mL/min/{1.73_m2} (ref >59)
Glucose: 73 mg/dL (ref 65–99)
Potassium: 3.1 mmol/L — ABNORMAL LOW (ref 3.5–5.2)
Sodium: 141 mmol/L (ref 134–144)

## 2018-06-25 MED ORDER — POTASSIUM CHLORIDE ER 20 MEQ PO TBCR: 20.0000 meq | EXTENDED_RELEASE_TABLET | Freq: Every day | ORAL | 5 refills | Status: DC

## 2018-06-25 MED FILL — POTASSIUM CL ER 20 MEQ TABL: 20 | 30 days supply | Qty: 30 | Fill #0

## 2018-06-26 LAB — CYTOLOGY - PAP: HPV: NOT DETECTED

## 2018-06-27 ENCOUNTER — Other Ambulatory Visit: Payer: Self-pay | Admitting: Family Medicine

## 2018-06-27 DIAGNOSIS — R87612 Low grade squamous intraepithelial lesion on cytologic smear of cervix (LGSIL): Secondary | ICD-10-CM

## 2018-06-27 MED ORDER — FLUCONAZOLE 150 MG PO TABS: 150.0000 mg | ORAL_TABLET | Freq: Once | ORAL | 0 refills | Status: AC

## 2018-06-27 MED FILL — FLUCONAZOLE 150 MG TABS: 150 | 1 days supply | Qty: 1 | Fill #0

## 2018-07-04 MED FILL — CHLORTHALIDONE 25 MG TAB: 25 | 30 days supply | Qty: 30 | Fill #0

## 2018-07-04 MED FILL — AMLODIPINE BESYLATE 10 MG T: 10 | 30 days supply | Qty: 30 | Fill #0

## 2018-07-09 ENCOUNTER — Ambulatory Visit: Payer: Self-pay | Attending: Family Medicine

## 2018-07-24 MED FILL — POTASSIUM CL ER 20 MEQ TABL: 20 | 30 days supply | Qty: 30 | Fill #1

## 2018-07-24 MED FILL — ?CHLORTHALIDONE 25 MG TABLE: 25 | 30 days supply | Qty: 30 | Fill #5

## 2018-07-29 MED FILL — AMLODIPINE BESYLATE 10 MG T: 10 | 30 days supply | Qty: 30 | Fill #1

## 2018-08-05 ENCOUNTER — Other Ambulatory Visit (HOSPITAL_COMMUNITY)
Admission: RE | Admit: 2018-08-05 | Discharge: 2018-08-05 | Disposition: A | Discharge status: Home / Self Care | Payer: Medicaid Other | Source: Ambulatory Visit | Attending: Obstetrics & Gynecology | Admitting: Obstetrics & Gynecology

## 2018-08-05 ENCOUNTER — Encounter: Payer: Self-pay | Admitting: Obstetrics & Gynecology

## 2018-08-05 ENCOUNTER — Ambulatory Visit (INDEPENDENT_AMBULATORY_CARE_PROVIDER_SITE_OTHER): Payer: Medicaid Other | Admitting: Obstetrics & Gynecology

## 2018-08-05 VITALS — BP 139/90 | HR 90 | Wt 129.6 lb

## 2018-08-05 DIAGNOSIS — R87612 Low grade squamous intraepithelial lesion on cytologic smear of cervix (LGSIL): Secondary | ICD-10-CM

## 2018-08-05 DIAGNOSIS — N870 Mild cervical dysplasia: Secondary | ICD-10-CM | POA: Insufficient documentation

## 2018-08-05 LAB — POCT PREGNANCY, URINE: Preg Test, Ur: NEGATIVE

## 2018-08-05 NOTE — Progress Notes (Signed)
Patient given informed consent, signed copy in the chart, time out was performed.  Placed in lithotomy position. Cervix viewed with speculum and colposcope after application of acetic acid.   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  yes Acetowhite lesions? yes Punctation? no Mosaicism?  no Abnormal vasculature?  no Biopsies? yes  ECC? no   Patient was given post procedure instructions.  She will return in 4 weeks for results. Pts hrHPV was neg.  She is s/p LEEP 06/21/2017.  I suspect this ot be a low grade lesion or neg for dysplasia.  Will f/u surg path.    Jasmeet Manton L. Harraway-Smith, M.D., [place]

## 2018-08-05 NOTE — Patient Instructions (Signed)

## 2018-08-06 ENCOUNTER — Encounter: Payer: Self-pay | Admitting: *Deleted

## 2018-08-11 ENCOUNTER — Other Ambulatory Visit: Payer: Self-pay

## 2018-08-11 ENCOUNTER — Encounter: Payer: Self-pay | Admitting: Surgery

## 2018-08-11 ENCOUNTER — Ambulatory Visit (INDEPENDENT_AMBULATORY_CARE_PROVIDER_SITE_OTHER): Payer: Self-pay | Admitting: Surgery

## 2018-08-11 VITALS — BP 127/86 | HR 77 | Temp 97.7°F | Ht 63.0 in | Wt 134.0 lb

## 2018-08-11 DIAGNOSIS — K824 Cholesterolosis of gallbladder: Secondary | ICD-10-CM

## 2018-08-11 NOTE — Patient Instructions (Signed)
Patient needs to be scheduled for gallbladder surgery with Dr.Pabon.

## 2018-08-12 ENCOUNTER — Encounter: Payer: Self-pay | Admitting: Surgery

## 2018-08-12 NOTE — Addendum Note (Signed)
Addended by: Sterling Big F on: 08/12/2018 03:15 PM   Modules accepted: Level of Service

## 2018-08-12 NOTE — H&P (View-Only) (Signed)
Patient ID: Kelsey Joyce, female   DOB: 05/21/1986, 32 y.o.   MRN: [id number]  HPI Kelsey Joyce is a 32 y.o. female seen in consultation at the request of Dr. Alvis Lemmings for gallbladder polyp.  Reports that she experiences intermittent attacks of right upper quadrant and epigastric pain.  Pain is moderate intensity and sharp in nature.  Associated with nausea vomiting.  No fevers no chills no evidence of weight loss.  No evidence of obstructive jaundice. No Specific alleviating or aggravating factors Ultrasound personally reviewed as well as CT evidence of multiple gallbladder polyps largest measuring 5 mm.  Normal common bile duct.  It is of biliary dilation CMP and creatinine were normal.  Normal CBC. She is able to perform more than 4 METS of activity without any shortness of breath or chest pain.  No Previous abdominal operations.   HPI  Past Medical History:  Diagnosis Date  . Anxiety   . Hypertension     Past Surgical History:  Procedure Laterality Date  . LEEP      Family History  Problem Relation Age of Onset  . Hypertension Mother   . Cervical cancer Unknown 60       Grandmother  . Hypertension Paternal Aunt   . Breast cancer Unknown 39       Aunt  . Breast cancer Unknown        Great Aunt   . Cancer Maternal Grandmother        cerival    Social History Social History   Tobacco Use  . Smoking status: Current Some Day Smoker    Packs/day: 0.20    Types: Cigarettes  . Smokeless tobacco: Former Neurosurgeon    Quit date: 04/23/2018  . Tobacco comment: smoking 1-2 cigs per day  Substance Use Topics  . Alcohol use: Yes    Alcohol/week: 0.0 standard drinks    Comment: occassionally  . Drug use: No    Allergies  Allergen Reactions  . Acetaminophen Nausea Only  . Asa [Aspirin] Nausea Only    Current Outpatient Medications  Medication Sig Dispense Refill  . amLODipine (NORVASC) 10 MG tablet Take 1 tablet (10 mg total) by mouth daily. 30 tablet 6  . cetirizine  (ZYRTEC) 10 MG tablet Take 1 tablet (10 mg total) by mouth daily. 30 tablet 6  . chlorthalidone (HYGROTON) 25 MG tablet Take 1 tablet (25 mg total) by mouth daily. 30 tablet 6  . cloNIDine (CATAPRES) 0.1 MG tablet clonidine HCl 0.1 mg tablet  Take 2 tablets every day by oral route as directed for 1 day.    . doxazosin (CARDURA XL) 4 MG 24 hr tablet Cardura XL 4 mg tablet,extended release  Take 1 tablet every day by oral route as directed for 30 days.    . fluconazole (DIFLUCAN) 150 MG tablet TAKE 1 TABLET (150 MG TOTAL) BY MOUTH ONCE FOR 1 DOSE.  0  . fluticasone (FLONASE) 50 MCG/ACT nasal spray Place 2 sprays into both nostrils daily. 16 g 6  . ibuprofen (ADVIL,MOTRIN) 600 MG tablet Take 1 tablet (600 mg total) by mouth every 12 (twelve) hours as needed for moderate pain. 30 tablet 0  . lisinopril (PRINIVIL,ZESTRIL) 40 MG tablet lisinopril 40 mg tablet  Take 1 tablet every day by oral route.    . metroNIDAZOLE (FLAGYL) 500 MG tablet Flagyl 500 mg tablet  Take 1 tablet twice a day by oral route as directed for 7 days.    . Potassium Chloride ER 20 MEQ  TBCR Take 20 mEq by mouth daily. 30 tablet 5  . potassium chloride SA (K-DUR,KLOR-CON) 20 MEQ tablet Take 20 mEq by mouth daily.  5   No current facility-administered medications for this visit.      Review of Systems Full ROS  was asked and was negative except for the information on the HPI  Physical Exam Blood pressure 127/86, pulse 77, temperature 97.7 F (36.5 C), temperature source Temporal, height 5\' 3"  (1.6 m), weight 134 lb (60.8 kg), last menstrual period 08/11/2018, SpO2 97 %. CONSTITUTIONAL: Nad EYES: Pupils are equal, round, and reactive to light, Sclera are non-icteric. EARS, NOSE, MOUTH AND THROAT: The oropharynx is clear. The oral mucosa is pink and moist. Hearing is intact to voice. LYMPH NODES:  Lymph nodes in the neck are normal. RESPIRATORY:  Lungs are clear. There is normal respiratory effort, with equal breath sounds  bilaterally, and without pathologic use of accessory muscles. CARDIOVASCULAR: Heart is regular without murmurs, gallops, or rubs. GI: The abdomen is soft, nontender, and nondistended. There are no palpable masses. There is no hepatosplenomegaly. There are normal bowel sounds in all quadrants. Reducible umbilical hernia GU: Rectal deferred.   MUSCULOSKELETAL: Normal muscle strength and tone. No cyanosis or edema.   SKIN: Turgor is good and there are no pathologic skin lesions or ulcers. NEUROLOGIC: Motor and sensation is grossly normal. Cranial nerves are grossly intact. PSYCH:  Oriented to person, place and time. Affect is normal.  Data Reviewed  I have personally reviewed the patient's imaging, laboratory findings and medical records.    Assessment/Plan 32 year old female with classic symptoms consistent with biliary disease attributed to symptomatic gallbladder polyps.  Had a lengthy discussion with patient about her disease process.  Options of management including observation versus surgical intervention discussed with the patient in detail.  She chooses to undergo laparoscopic cholecystectomy robotically assisted with [facility] repair. The risks, benefits, complications, treatment options, and expected outcomes were discussed with the patient. The possibilities of bleeding, recurrent infection, finding a normal gallbladder, perforation of viscus organs, damage to surrounding structures, bile leak, abscess formation, needing a drain placed, the need for additional procedures, reaction to medication, pulmonary aspiration,  failure to diagnose a condition, the possible need to convert to an open procedure, and creating a complication requiring transfusion or operation were discussed with the patient. The patient and/or family concurred with the proposed plan, giving informed consent.   A Copy Of this report was sent to the referring provider  Sterling Big, MD FACS General Surgeon 08/12/2018, 3:07  PM

## 2018-08-12 NOTE — Progress Notes (Signed)
Patient ID: Kelsey Joyce, female   DOB: 1986-05-04, 32 y.o.   MRN: [id number]  HPI Kelsey Joyce is a 32 y.o. female seen in consultation at the request of Dr. Alvis Lemmings for gallbladder polyp.  Reports that she experiences intermittent attacks of right upper quadrant and epigastric pain.  Pain is moderate intensity and sharp in nature.  Associated with nausea vomiting.  No fevers no chills no evidence of weight loss.  No evidence of obstructive jaundice. No Specific alleviating or aggravating factors Ultrasound personally reviewed as well as CT evidence of multiple gallbladder polyps largest measuring 5 mm.  Normal common bile duct.  It is of biliary dilation CMP and creatinine were normal.  Normal CBC. She is able to perform more than 4 METS of activity without any shortness of breath or chest pain.  No Previous abdominal operations.   HPI  Past Medical History:  Diagnosis Date  . Anxiety   . Hypertension     Past Surgical History:  Procedure Laterality Date  . LEEP      Family History  Problem Relation Age of Onset  . Hypertension Mother   . Cervical cancer Unknown 60       Grandmother  . Hypertension Paternal Aunt   . Breast cancer Unknown 39       Aunt  . Breast cancer Unknown        Great Aunt   . Cancer Maternal Grandmother        cerival    Social History Social History   Tobacco Use  . Smoking status: Current Some Day Smoker    Packs/day: 0.20    Types: Cigarettes  . Smokeless tobacco: Former Neurosurgeon    Quit date: 04/23/2018  . Tobacco comment: smoking 1-2 cigs per day  Substance Use Topics  . Alcohol use: Yes    Alcohol/week: 0.0 standard drinks    Comment: occassionally  . Drug use: No    Allergies  Allergen Reactions  . Acetaminophen Nausea Only  . Asa [Aspirin] Nausea Only    Current Outpatient Medications  Medication Sig Dispense Refill  . amLODipine (NORVASC) 10 MG tablet Take 1 tablet (10 mg total) by mouth daily. 30 tablet 6  . cetirizine  (ZYRTEC) 10 MG tablet Take 1 tablet (10 mg total) by mouth daily. 30 tablet 6  . chlorthalidone (HYGROTON) 25 MG tablet Take 1 tablet (25 mg total) by mouth daily. 30 tablet 6  . cloNIDine (CATAPRES) 0.1 MG tablet clonidine HCl 0.1 mg tablet  Take 2 tablets every day by oral route as directed for 1 day.    . doxazosin (CARDURA XL) 4 MG 24 hr tablet Cardura XL 4 mg tablet,extended release  Take 1 tablet every day by oral route as directed for 30 days.    . fluconazole (DIFLUCAN) 150 MG tablet TAKE 1 TABLET (150 MG TOTAL) BY MOUTH ONCE FOR 1 DOSE.  0  . fluticasone (FLONASE) 50 MCG/ACT nasal spray Place 2 sprays into both nostrils daily. 16 g 6  . ibuprofen (ADVIL,MOTRIN) 600 MG tablet Take 1 tablet (600 mg total) by mouth every 12 (twelve) hours as needed for moderate pain. 30 tablet 0  . lisinopril (PRINIVIL,ZESTRIL) 40 MG tablet lisinopril 40 mg tablet  Take 1 tablet every day by oral route.    . metroNIDAZOLE (FLAGYL) 500 MG tablet Flagyl 500 mg tablet  Take 1 tablet twice a day by oral route as directed for 7 days.    . Potassium Chloride ER 20 MEQ  TBCR Take 20 mEq by mouth daily. 30 tablet 5  . potassium chloride SA (K-DUR,KLOR-CON) 20 MEQ tablet Take 20 mEq by mouth daily.  5   No current facility-administered medications for this visit.      Review of Systems Full ROS  was asked and was negative except for the information on the HPI  Physical Exam Blood pressure 127/86, pulse 77, temperature 97.7 F (36.5 C), temperature source Temporal, height 5\' 3"  (1.6 m), weight 134 lb (60.8 kg), last menstrual period 08/11/2018, SpO2 97 %. CONSTITUTIONAL: Nad EYES: Pupils are equal, round, and reactive to light, Sclera are non-icteric. EARS, NOSE, MOUTH AND THROAT: The oropharynx is clear. The oral mucosa is pink and moist. Hearing is intact to voice. LYMPH NODES:  Lymph nodes in the neck are normal. RESPIRATORY:  Lungs are clear. There is normal respiratory effort, with equal breath sounds  bilaterally, and without pathologic use of accessory muscles. CARDIOVASCULAR: Heart is regular without murmurs, gallops, or rubs. GI: The abdomen is soft, nontender, and nondistended. There are no palpable masses. There is no hepatosplenomegaly. There are normal bowel sounds in all quadrants. Reducible umbilical hernia GU: Rectal deferred.   MUSCULOSKELETAL: Normal muscle strength and tone. No cyanosis or edema.   SKIN: Turgor is good and there are no pathologic skin lesions or ulcers. NEUROLOGIC: Motor and sensation is grossly normal. Cranial nerves are grossly intact. PSYCH:  Oriented to person, place and time. Affect is normal.  Data Reviewed  I have personally reviewed the patient's imaging, laboratory findings and medical records.    Assessment/Plan 32 year old female with classic symptoms consistent with biliary disease attributed to symptomatic gallbladder polyps.  Had a lengthy discussion with patient about her disease process.  Options of management including observation versus surgical intervention discussed with the patient in detail.  She chooses to undergo laparoscopic cholecystectomy robotically assisted with [facility] repair. The risks, benefits, complications, treatment options, and expected outcomes were discussed with the patient. The possibilities of bleeding, recurrent infection, finding a normal gallbladder, perforation of viscus organs, damage to surrounding structures, bile leak, abscess formation, needing a drain placed, the need for additional procedures, reaction to medication, pulmonary aspiration,  failure to diagnose a condition, the possible need to convert to an open procedure, and creating a complication requiring transfusion or operation were discussed with the patient. The patient and/or family concurred with the proposed plan, giving informed consent.   A Copy Of this report was sent to the referring provider  Sterling Big, MD FACS General Surgeon 08/12/2018, 3:07  PM

## 2018-08-13 ENCOUNTER — Telehealth: Payer: Self-pay | Admitting: *Deleted

## 2018-08-13 ENCOUNTER — Encounter: Payer: Self-pay | Admitting: *Deleted

## 2018-08-13 NOTE — Progress Notes (Signed)
Patient's surgery has been scheduled for 09-09-18 at [facility] with Dr. Everlene Farrier.   The patient is aware to call the office should they have further questions.

## 2018-08-13 NOTE — Telephone Encounter (Signed)
FMLA papers completed Return to work date 10-13-18, pt agrees.

## 2018-08-27 MED FILL — ?CHLORTHALIDONE 25 MG TABLE: 25 | 30 days supply | Qty: 30 | Fill #6

## 2018-08-27 MED FILL — AMLODIPINE BESYLATE 10 MG T: 10 | 30 days supply | Qty: 30 | Fill #2

## 2018-08-27 MED FILL — POTASSIUM CL ER 20 MEQ TAB: 20 | 30 days supply | Qty: 30 | Fill #2

## 2018-09-01 ENCOUNTER — Other Ambulatory Visit: Payer: Self-pay

## 2018-09-01 ENCOUNTER — Encounter
Admission: RE | Admit: 2018-09-01 | Discharge: 2018-09-01 | Disposition: A | Discharge status: Home / Self Care | Payer: Self-pay | Source: Ambulatory Visit | Attending: Surgery | Admitting: Surgery

## 2018-09-01 NOTE — Pre-Procedure Instructions (Signed)
[facility]  ECHO COMPLETE WO IMAGING ENHANCING AGENT  Order# [id number]  Reading physician: Pricilla Riffle, MD Ordering physician: Dessa Phi, MD Study date: 11/19/16  Result Notes for ECHOCARDIOGRAM COMPLETE   Notes Recorded by Dessa Phi, MD on 11/19/2016 at 4:58 PM EST Normal cardiac ECHO Normal heart pumping Normal valves and chambers      Study Result   Result status: Final result                           [facility]                        [address]                        [place], [place] [address]                            [phone]  ------------------------------------------------------------------- Transthoracic Echocardiography  Patient:    Kelsey Joyce, Kelsey Joyce MR #:       [id number] Study Date: 11/19/2016 Gender:     F Age:        32 Height:     160 cm Weight:     59.5 kg BSA:        1.63 m^2 Pt. Status: Room:   ATTENDING    Dietrich Pates, M.D.  SONOGRAPHER  [address], [address]  [place], [place] [id number]  REFERRING    [place], [place] [id number]  PERFORMING   [facility], Outpatient  cc:  ------------------------------------------------------------------- LV EF: 60% -   65%  ------------------------------------------------------------------- History:   PMH:  Severe HTN.  Risk factors:  Current tobacco use.   ------------------------------------------------------------------- Study Conclusions  - Left ventricle: The cavity size was normal. Wall thickness was   normal. Systolic function was normal. The estimated ejection   fraction was in the range of 60% to 65%. Left ventricular   diastolic function parameters were normal.  ------------------------------------------------------------------- Study data:  No prior study was available for comparison.  Study status:  Routine.  Procedure:  The patient reported no pain pre or post test. Transthoracic echocardiography for left ventricular function evaluation. Image quality was  adequate.  Study completion:  There were no complications.          Transthoracic echocardiography.  M-mode, complete 2D, spectral Doppler, and color Doppler.  Birthdate:  Patient birthdate: 02/11/1986.  Age:  Patient is 32 yr old.  Sex:  Gender: female.    BMI: 23.2 kg/m^2.  Blood pressure:     137/94  Patient status:  Outpatient.  Study date: Study date: 11/19/2016. Study time: 02:07 PM.  Location:  [facility]  -------------------------------------------------------------------  ------------------------------------------------------------------- Left ventricle:  The cavity size was normal. Wall thickness was normal. Systolic function was normal. The estimated ejection fraction was in the range of 60% to 65%. The transmitral flow pattern was normal. The deceleration time of the early transmitral flow velocity was normal. The pulmonary vein flow pattern was normal. The tissue Doppler parameters were normal. Left ventricular diastolic function parameters were normal.  ------------------------------------------------------------------- Aortic valve:   Mildly thickened leaflets.  Doppler:  There was no significant regurgitation.  ------------------------------------------------------------------- Aorta:  The aorta was normal, not dilated, and non-diseased.  ------------------------------------------------------------------- Mitral valve:   Structurally normal valve.   Leaflet separation was normal.  Doppler:  Transvalvular velocity was  within the normal range. There was no evidence for stenosis. There was trivial regurgitation.    Peak gradient (D): 2 mm Hg.  ------------------------------------------------------------------- Left atrium:  The atrium was normal in size.  ------------------------------------------------------------------- Right ventricle:  The cavity size was normal. Wall thickness was normal. Systolic function was  normal.  ------------------------------------------------------------------- Pulmonic valve:    Structurally normal valve.   Cusp separation was normal.  Doppler:  Transvalvular velocity was within the normal range. There was no regurgitation.  ------------------------------------------------------------------- Tricuspid valve:   Structurally normal valve.   Leaflet separation was normal.  Doppler:  Transvalvular velocity was within the normal range. There was no regurgitation.  ------------------------------------------------------------------- Right atrium:  The atrium was normal in size.  ------------------------------------------------------------------- Pericardium:  There was no pericardial effusion.  ------------------------------------------------------------------- Systemic veins: Inferior vena cava: The vessel was normal in size. The respirophasic diameter changes were in the normal range (>= 50%), consistent with normal central venous pressure.  ------------------------------------------------------------------- Post procedure conclusions Ascending Aorta:  - The aorta was normal, not dilated, and non-diseased.  ------------------------------------------------------------------- Measurements   Left ventricle                           Value        Reference  LV ID, ED, PLAX chordal          (L)     39.1  mm     43 - 52  LV ID, ES, PLAX chordal                  24.9  mm     23 - 38  LV fx shortening, PLAX chordal           36    %      >=29  LV PW thickness, ED                      11.2  mm     ---------  IVS/LV PW ratio, ED                      1.07         <=1.3  Stroke volume, 2D                        59    ml     ---------  Stroke volume/bsa, 2D                    36    ml/m^2 ---------  LV ejection fraction, 1-p A4C            64    %      ---------  LV end-diastolic volume, 2-p             84    ml     ---------  LV end-systolic volume, 2-p               29    ml     ---------  LV ejection fraction, 2-p                65    %      ---------  Stroke volume, 2-p                       55    ml     ---------  LV  end-diastolic volume/bsa, 2-p         51    ml/m^2 ---------  LV end-systolic volume/bsa, 2-p          18    ml/m^2 ---------  Stroke volume/bsa, 2-p                   33.7  ml/m^2 ---------  LV e&', lateral                           10.7  cm/s   ---------  LV E/e&', lateral                         7.38         ---------  LV e&', medial                            7.99  cm/s   ---------  LV E/e&', medial                          9.89         ---------  LV e&', average                           9.35  cm/s   ---------  LV E/e&', average                         8.45         ---------    Ventricular septum                       Value        Reference  IVS thickness, ED                        12    mm     ---------    LVOT                                     Value        Reference  LVOT ID, S                               19    mm     ---------  LVOT area                                2.84  cm^2   ---------  LVOT ID                                  19    mm     ---------  LVOT peak velocity, S                    118   cm/s   ---------  LVOT mean velocity, S                    77.3  cm/s   ---------  LVOT VTI, S                              20.6  cm     ---------  LVOT peak gradient, S                    6     mm Hg  ---------  Stroke volume (SV), LVOT DP              58.4  ml     ---------  Stroke index (SV/bsa), LVOT DP           35.8  ml/m^2 ---------    Aorta                                    Value        Reference  Aortic root ID, ED                       31    mm     ---------  Ascending aorta ID, A-P, S               27    mm     ---------    Left atrium                              Value        Reference  LA ID, A-P, ES                           32    mm     ---------  LA ID/bsa, A-P                           1.96  cm/m^2  <=2.2  LA volume, S                             55    ml     ---------  LA volume/bsa, S                         33.7  ml/m^2 ---------  LA volume, ES, 1-p A4C                   56    ml     ---------  LA volume/bsa, ES, 1-p A4C               34.3  ml/m^2 ---------  LA volume, ES, 1-p A2C                   52    ml     ---------  LA volume/bsa, ES, 1-p A2C               31.8  ml/m^2 ---------    Mitral valve                             Value        Reference  Mitral E-wave peak velocity  79    cm/s   ---------  Mitral A-wave peak velocity              64.7  cm/s   ---------  Mitral deceleration time         (H)     254   ms     150 - 230  Mitral peak gradient, D                  2     mm Hg  ---------  Mitral E/A ratio, peak                   1.2          ---------    Pulmonary arteries                       Value        Reference  PA pressure, S, DP                       20    mm Hg  <=30    Tricuspid valve                          Value        Reference  Tricuspid regurg peak velocity           209   cm/s   ---------  Tricuspid peak RV-RA gradient            17    mm Hg  ---------    Systemic veins                           Value        Reference  Estimated CVP                            3     mm Hg  ---------    Right ventricle                          Value        Reference  RV pressure, S, DP                       20    mm Hg  <=30  RV s&', lateral, S                        10.3  cm/s   ---------  Legend: (L)  and  (H)  mark values outside specified reference range.  ------------------------------------------------------------------- Prepared and Electronically Authenticated by  Dietrich Pates, M.D. 2018-02-12T16:41:27  MERGE Images   Show images for ECHOCARDIOGRAM COMPLETE  Patient Information   Patient Name Shiana, Rappleye Sex Female DOB 26-Jun-1986 SSN [id number]  Reason for Exam  Priority: Routine  Dx: Severe hypertension [I10 (ICD-10-CM)]   Comments:   Surgical History   Surgical History   No past medical history on file.    Other Surgical History   Procedure Laterality Date Comment Source  LEEP    Provider    Performing Technologist/Nurse   Performing Technologist/Nurse: Rutherford Nail  Implants    No active implants to display in this view.  Order-Level Documents:   There are no order-level documents.  Encounter-Level Documents - 11/19/2016:   Electronic signature on 11/19/2016 1:46 PM: CSN: [id number] - Signed  Electronic signature on 11/19/2016 1:46 PM - Signed      Signed   Electronically signed by Pricilla Riffle, MD on 11/19/16 at 1641 EST  Printable Result Report   Result Report   External Result Report   External Result Report    Patient Result Comments   Viewed by Toya Smothers on 11/30/2016 2:54 PM  Written by Dessa Phi, MD on 11/19/2016 4:58 PM  Normal cardiac ECHO  Normal heart pumping  Normal valves and chambers

## 2018-09-01 NOTE — Patient Instructions (Signed)
Your procedure is scheduled on: 09-09-18 TUESDAY Report to Same Day Surgery 2nd floor medical mall [facility](Medical Mall Entrance-take elevator on left to 2nd floor.  Check in with surgery information desk.) To find out your arrival time please call [phone] between 1PM - 3PM on 09-08-18 MONDAY  Remember: Instructions that are not followed completely may result in serious medical risk, up to and including death, or upon the discretion of your surgeon and anesthesiologist your surgery may need to be rescheduled.    _x___ 1. Do not eat food after midnight the night before your procedure. NO GUM OR CANDY AFTER MIDNIGHT. You may drink clear liquids up to 2 hours before you are scheduled to arrive at the hospital for your procedure.  Do not drink clear liquids within 2 hours of your scheduled arrival to the hospital.  Clear liquids include  --Water or Apple juice without pulp  --Clear carbohydrate beverage such as ClearFast or Gatorade  --Black Coffee or Clear Tea (No milk, no creamers, do not add anything to the coffee or Tea   ____Ensure clear carbohydrate drink on the way to the hospital for bariatric patients  ____Ensure clear carbohydrate drink 3 hours before surgery for Dr Rutherford NailByrnett's patients if physician instructed.    __x__ 2. No Alcohol for 24 hours before or after surgery.   __x__3. No Smoking or e-cigarettes for 24 prior to surgery.  Do not use any chewable tobacco products for at least 6 hour prior to surgery   ____  4. Bring all medications with you on the day of surgery if instructed.    __x__ 5. Notify your doctor if there is any change in your medical condition     (cold, fever, infections).    x___6. On the morning of surgery brush your teeth with toothpaste and water.  You may rinse your mouth with mouth wash if you wish.  Do not swallow any toothpaste or mouthwash.   Do not wear jewelry, make-up, hairpins, clips or nail polish.  Do not wear lotions, powders, or perfumes. You  may wear deodorant.  Do not shave 48 hours prior to surgery. Men may shave face and neck.  Do not bring valuables to the hospital.    [facility] is not responsible for any belongings or valuables.               Contacts, dentures or bridgework may not be worn into surgery.  Leave your suitcase in the car. After surgery it may be brought to your room.  For patients admitted to the hospital, discharge time is determined by your  treatment team.  _  Patients discharged the day of surgery will not be allowed to drive home.  You will need someone to drive you home and stay with you the night of your procedure.    Please read over the following fact sheets that you were given:   [facility] Preparing for [facility]  _x___ TAKE THE FOLLOWING MEDICATION THE MORNING OF SURGERY WITH A SMALL SIP OF WATER. These include:  1. AMLODIPINE (NORVASC)  2. ZYRTEC (CETIRIZINE)  3.  4.  5.  6.  ____Fleets enema or Magnesium Citrate as directed.   _x___ Use CHG Soap or sage wipes as directed on instruction sheet   ____ Use inhalers on the day of surgery and bring to hospital day of surgery  ____ Stop Metformin and Janumet 2 days prior to surgery.    ____ Take 1/2 of usual insulin dose the night  before surgery and none on the morning surgery.   ____ Follow recommendations from Cardiologist, Pulmonologist or PCP regarding stopping Aspirin, Coumadin, Plavix ,Eliquis, Effient, or Pradaxa, and Pletal.  X____Stop Anti-inflammatories such as Advil, Aleve, Ibuprofen, Motrin, Naproxen, Naprosyn, Goodies powders or aspirin products NOW-OK to take Tylenol    ____ Stop supplements until after surgery.     ____ Bring C-Pap to the hospital.

## 2018-09-03 ENCOUNTER — Encounter
Admission: RE | Admit: 2018-09-03 | Discharge: 2018-09-03 | Disposition: A | Discharge status: Home / Self Care | Payer: Medicaid Other | Source: Ambulatory Visit | Attending: Surgery | Admitting: Surgery

## 2018-09-03 DIAGNOSIS — Z01818 Encounter for other preprocedural examination: Secondary | ICD-10-CM | POA: Insufficient documentation

## 2018-09-03 DIAGNOSIS — I10 Essential (primary) hypertension: Secondary | ICD-10-CM

## 2018-09-03 LAB — POTASSIUM: Potassium: 3.6 mmol/L (ref 3.5–5.1)

## 2018-09-08 MED ORDER — CEFAZOLIN SODIUM-DEXTROSE 2-4 GM/100ML-% IV SOLN
2.0000 g | INTRAVENOUS | Status: AC
  Administered 2018-09-09: 2 g via INTRAVENOUS

## 2018-09-09 ENCOUNTER — Ambulatory Visit: Payer: Self-pay | Admitting: Anesthesiology

## 2018-09-09 ENCOUNTER — Ambulatory Visit
Admission: RE | Admit: 2018-09-09 | Discharge: 2018-09-09 | Disposition: A | Discharge status: Home / Self Care | Payer: Self-pay | Source: Ambulatory Visit | Attending: Surgery | Admitting: Surgery

## 2018-09-09 ENCOUNTER — Encounter
Admission: RE | Disposition: A | Discharge status: Home / Self Care | Payer: Self-pay | Source: Ambulatory Visit | Attending: Surgery

## 2018-09-09 DIAGNOSIS — F17210 Nicotine dependence, cigarettes, uncomplicated: Secondary | ICD-10-CM | POA: Insufficient documentation

## 2018-09-09 DIAGNOSIS — K429 Umbilical hernia without obstruction or gangrene: Secondary | ICD-10-CM | POA: Insufficient documentation

## 2018-09-09 DIAGNOSIS — K8044 Calculus of bile duct with chronic cholecystitis without obstruction: Secondary | ICD-10-CM | POA: Insufficient documentation

## 2018-09-09 DIAGNOSIS — I10 Essential (primary) hypertension: Secondary | ICD-10-CM | POA: Insufficient documentation

## 2018-09-09 DIAGNOSIS — Z79899 Other long term (current) drug therapy: Secondary | ICD-10-CM | POA: Insufficient documentation

## 2018-09-09 DIAGNOSIS — K824 Cholesterolosis of gallbladder: Secondary | ICD-10-CM

## 2018-09-09 HISTORY — PX: ROBOTIC ASSISTED LAPAROSCOPIC CHOLECYSTECTOMY-MULTI SITE: SHX6603

## 2018-09-09 HISTORY — PX: UMBILICAL HERNIA REPAIR: SHX196

## 2018-09-09 LAB — URINE DRUG SCREEN, QUALITATIVE (ARMC ONLY)
Amphetamines, Ur Screen: NOT DETECTED
Barbiturates, Ur Screen: NOT DETECTED
Benzodiazepine, Ur Scrn: NOT DETECTED
Cannabinoid 50 Ng, Ur ~~LOC~~: POSITIVE — AB
Cocaine Metabolite,Ur ~~LOC~~: NOT DETECTED
MDMA (Ecstasy)Ur Screen: NOT DETECTED
Methadone Scn, Ur: NOT DETECTED
Opiate, Ur Screen: NOT DETECTED
Phencyclidine (PCP) Ur S: NOT DETECTED
Tricyclic, Ur Screen: NOT DETECTED

## 2018-09-09 LAB — POCT PREGNANCY, URINE: Preg Test, Ur: NEGATIVE

## 2018-09-09 SURGERY — ROBOTIC ASSISTED LAPAROSCOPIC CHOLECYSTECTOMY-MULTI SITE
Anesthesia: General

## 2018-09-09 MED ORDER — FENTANYL CITRATE (PF) 100 MCG/2ML IJ SOLN
INTRAMUSCULAR | Status: AC
  Filled 2018-09-09: qty 2

## 2018-09-09 MED ORDER — DEXMEDETOMIDINE HCL 200 MCG/2ML IV SOLN
INTRAVENOUS | Status: DC | PRN
  Administered 2018-09-09: 8 ug via INTRAVENOUS
  Administered 2018-09-09: 4 ug via INTRAVENOUS

## 2018-09-09 MED ORDER — ROCURONIUM BROMIDE 50 MG/5ML IV SOLN
INTRAVENOUS | Status: AC
  Filled 2018-09-09: qty 1

## 2018-09-09 MED ORDER — DEXAMETHASONE SODIUM PHOSPHATE 10 MG/ML IJ SOLN
INTRAMUSCULAR | Status: AC
  Filled 2018-09-09: qty 1

## 2018-09-09 MED ORDER — FENTANYL CITRATE (PF) 100 MCG/2ML IJ SOLN
INTRAMUSCULAR | Status: DC | PRN
  Administered 2018-09-09 (×3): 50 ug via INTRAVENOUS
  Administered 2018-09-09: 100 ug via INTRAVENOUS

## 2018-09-09 MED ORDER — PROMETHAZINE HCL 25 MG/ML IJ SOLN
INTRAMUSCULAR | Status: AC
  Filled 2018-09-09: qty 1

## 2018-09-09 MED ORDER — DEXAMETHASONE SODIUM PHOSPHATE 10 MG/ML IJ SOLN
INTRAMUSCULAR | Status: DC | PRN
  Administered 2018-09-09: 10 mg via INTRAVENOUS

## 2018-09-09 MED ORDER — ONDANSETRON HCL 4 MG/2ML IJ SOLN
INTRAMUSCULAR | Status: DC | PRN
  Administered 2018-09-09: 4 mg via INTRAVENOUS

## 2018-09-09 MED ORDER — CEFAZOLIN SODIUM-DEXTROSE 2-4 GM/100ML-% IV SOLN
INTRAVENOUS | Status: AC
  Filled 2018-09-09: qty 100

## 2018-09-09 MED ORDER — ONDANSETRON HCL 4 MG/2ML IJ SOLN
INTRAMUSCULAR | Status: AC
  Filled 2018-09-09: qty 2

## 2018-09-09 MED ORDER — SODIUM CHLORIDE FLUSH 0.9 % IV SOLN
INTRAVENOUS | Status: AC
  Filled 2018-09-09: qty 10

## 2018-09-09 MED ORDER — EPHEDRINE SULFATE 50 MG/ML IJ SOLN
INTRAMUSCULAR | Status: AC
  Filled 2018-09-09: qty 1

## 2018-09-09 MED ORDER — OXYCODONE HCL 5 MG PO TABS
5.0000 mg | ORAL_TABLET | Freq: Once | ORAL | Status: AC | PRN
  Administered 2018-09-09: 5 mg via ORAL

## 2018-09-09 MED ORDER — FENTANYL CITRATE (PF) 100 MCG/2ML IJ SOLN
25.0000 ug | INTRAMUSCULAR | Status: DC | PRN
  Administered 2018-09-09 (×2): 25 ug via INTRAVENOUS

## 2018-09-09 MED ORDER — PROPOFOL 10 MG/ML IV BOLUS
INTRAVENOUS | Status: AC
  Filled 2018-09-09: qty 20

## 2018-09-09 MED ORDER — DEXMEDETOMIDINE HCL IN NACL 80 MCG/20ML IV SOLN
INTRAVENOUS | Status: AC
  Filled 2018-09-09: qty 20

## 2018-09-09 MED ORDER — SUGAMMADEX SODIUM 200 MG/2ML IV SOLN
INTRAVENOUS | Status: AC
  Filled 2018-09-09: qty 2

## 2018-09-09 MED ORDER — SEVOFLURANE IN SOLN
RESPIRATORY_TRACT | Status: AC
  Filled 2018-09-09: qty 250

## 2018-09-09 MED ORDER — SUGAMMADEX SODIUM 200 MG/2ML IV SOLN
INTRAVENOUS | Status: DC | PRN
  Administered 2018-09-09: 200 mg via INTRAVENOUS

## 2018-09-09 MED ORDER — CHLORHEXIDINE GLUCONATE CLOTH 2 % EX PADS: 6.0000 | MEDICATED_PAD | Freq: Once | CUTANEOUS | Status: DC

## 2018-09-09 MED ORDER — PROPOFOL 10 MG/ML IV BOLUS
INTRAVENOUS | Status: DC | PRN
  Administered 2018-09-09: 150 mg via INTRAVENOUS

## 2018-09-09 MED ORDER — BUPIVACAINE-EPINEPHRINE (PF) 0.25% -1:200000 IJ SOLN
INTRAMUSCULAR | Status: AC
  Filled 2018-09-09: qty 30

## 2018-09-09 MED ORDER — CHLORHEXIDINE GLUCONATE CLOTH 2 % EX PADS
6.0000 | MEDICATED_PAD | Freq: Once | CUTANEOUS | Status: AC
  Administered 2018-09-09: 6 via TOPICAL

## 2018-09-09 MED ORDER — DIPHENHYDRAMINE HCL 50 MG/ML IJ SOLN
INTRAMUSCULAR | Status: AC
  Filled 2018-09-09: qty 1

## 2018-09-09 MED ORDER — LACTATED RINGERS IV SOLN
INTRAVENOUS | Status: DC
  Administered 2018-09-09: 07:00:00 via INTRAVENOUS

## 2018-09-09 MED ORDER — PROMETHAZINE HCL 25 MG/ML IJ SOLN
6.2500 mg | INTRAMUSCULAR | Status: DC | PRN
  Administered 2018-09-09: 6.25 mg via INTRAVENOUS

## 2018-09-09 MED ORDER — OXYCODONE HCL 5 MG/5ML PO SOLN: 5.0000 mg | Freq: Once | ORAL | Status: AC | PRN

## 2018-09-09 MED ORDER — INDOCYANINE GREEN 25 MG IV SOLR
7.5000 mg | Freq: Once | INTRAVENOUS | Status: AC
  Administered 2018-09-09: 7.5 mg via INTRAVENOUS
  Filled 2018-09-09: qty 8

## 2018-09-09 MED ORDER — DIPHENHYDRAMINE HCL 50 MG/ML IJ SOLN
INTRAMUSCULAR | Status: DC | PRN
  Administered 2018-09-09: 25 mg via INTRAVENOUS

## 2018-09-09 MED ORDER — BUPIVACAINE HCL 0.25 % IJ SOLN
INTRAMUSCULAR | Status: DC | PRN
  Administered 2018-09-09: 30 mL

## 2018-09-09 MED ORDER — OXYCODONE HCL 5 MG PO TABS: 5.0000 mg | ORAL_TABLET | Freq: Four times a day (QID) | ORAL | 0 refills | Status: DC | PRN

## 2018-09-09 MED ORDER — GABAPENTIN 300 MG PO CAPS
300.0000 mg | ORAL_CAPSULE | ORAL | Status: AC
  Administered 2018-09-09: 300 mg via ORAL

## 2018-09-09 MED ORDER — ROCURONIUM BROMIDE 100 MG/10ML IV SOLN
INTRAVENOUS | Status: DC | PRN
  Administered 2018-09-09: 10 mg via INTRAVENOUS
  Administered 2018-09-09: 40 mg via INTRAVENOUS

## 2018-09-09 MED ORDER — FAMOTIDINE 20 MG PO TABS
ORAL_TABLET | ORAL | Status: AC
  Administered 2018-09-09: 20 mg via ORAL
  Filled 2018-09-09: qty 1

## 2018-09-09 MED ORDER — FAMOTIDINE 20 MG PO TABS
20.0000 mg | ORAL_TABLET | Freq: Once | ORAL | Status: AC
  Administered 2018-09-09: 20 mg via ORAL

## 2018-09-09 MED ORDER — MIDAZOLAM HCL 2 MG/2ML IJ SOLN
INTRAMUSCULAR | Status: AC
  Filled 2018-09-09: qty 2

## 2018-09-09 MED ORDER — LIDOCAINE HCL (CARDIAC) PF 100 MG/5ML IV SOSY
PREFILLED_SYRINGE | INTRAVENOUS | Status: DC | PRN
  Administered 2018-09-09 (×2): 50 mg via INTRAVENOUS

## 2018-09-09 MED ORDER — LIDOCAINE HCL (PF) 2 % IJ SOLN
INTRAMUSCULAR | Status: AC
  Filled 2018-09-09: qty 10

## 2018-09-09 MED ORDER — GABAPENTIN 300 MG PO CAPS
ORAL_CAPSULE | ORAL | Status: AC
  Administered 2018-09-09: 300 mg via ORAL
  Filled 2018-09-09: qty 1

## 2018-09-09 MED ORDER — OXYCODONE HCL 5 MG PO TABS
ORAL_TABLET | ORAL | Status: AC
  Filled 2018-09-09: qty 1

## 2018-09-09 MED ORDER — MEPERIDINE HCL 50 MG/ML IJ SOLN: 6.2500 mg | INTRAMUSCULAR | Status: DC | PRN

## 2018-09-09 MED ORDER — MIDAZOLAM HCL 2 MG/2ML IJ SOLN
INTRAMUSCULAR | Status: DC | PRN
  Administered 2018-09-09: 2 mg via INTRAVENOUS

## 2018-09-09 MED FILL — oxyCODONE HCL 5 MG TABS: 5 | 7 days supply | Qty: 20 | Fill #0

## 2018-09-09 SURGICAL SUPPLY — 60 items
"PENCIL ELECTRO HAND CTR " (MISCELLANEOUS) ×1 IMPLANT
APPLIER CLIP 11 MED OPEN (CLIP) ×2
BLADE CLIPPER SURG (BLADE) ×1 IMPLANT
BLADE SURG 15 STRL LF DISP TIS (BLADE) ×1 IMPLANT
BLADE SURG 15 STRL SS (BLADE) ×1
CANISTER SUCT 1200ML W/VALVE (MISCELLANEOUS) ×2 IMPLANT
CHLORAPREP W/TINT 26ML (MISCELLANEOUS) ×2 IMPLANT
CLIP APPLIE 11 MED OPEN (CLIP) ×1 IMPLANT
CLIP VESOLOCK MED LG 6/CT (CLIP) ×4 IMPLANT
CORD BIP STRL DISP 12FT (MISCELLANEOUS) ×2 IMPLANT
COVER TIP SHEARS 8 DVNC (MISCELLANEOUS) IMPLANT
COVER TIP SHEARS 8MM DA VINCI (MISCELLANEOUS) ×1
COVER WAND RF STERILE (DRAPES) ×2 IMPLANT
DECANTER SPIKE VIAL GLASS SM (MISCELLANEOUS) ×2 IMPLANT
DEFOGGER SCOPE WARMER CLEARIFY (MISCELLANEOUS) ×2 IMPLANT
DERMABOND ADVANCED (GAUZE/BANDAGES/DRESSINGS) ×1
DERMABOND ADVANCED .7 DNX12 (GAUZE/BANDAGES/DRESSINGS) ×1 IMPLANT
DRAPE INCISE IOBAN 66X45 STRL (DRAPES) ×1 IMPLANT
DRAPE LAPAROTOMY 77X122 PED (DRAPES) ×1 IMPLANT
DRAPE SHEET LG 3/4 BI-LAMINATE (DRAPES) ×2 IMPLANT
DRSG TELFA 3X8 NADH (GAUZE/BANDAGES/DRESSINGS) IMPLANT
ELECT CAUTERY BLADE 6.4 (BLADE) ×3 IMPLANT
ELECT REM PT RETURN 9FT ADLT (ELECTROSURGICAL) ×2
ELECTRODE REM PT RTRN 9FT ADLT (ELECTROSURGICAL) ×1 IMPLANT
GLOVE BIO SURGEON STRL SZ7 (GLOVE) ×4 IMPLANT
GOWN STRL REUS W/ TWL LRG LVL3 (GOWN DISPOSABLE) ×4 IMPLANT
GOWN STRL REUS W/TWL LRG LVL3 (GOWN DISPOSABLE) ×4
IRRIGATION STRYKERFLOW (MISCELLANEOUS) IMPLANT
IRRIGATOR STRYKERFLOW (MISCELLANEOUS)
IV NS 1000ML (IV SOLUTION)
IV NS 1000ML BAXH (IV SOLUTION) ×1 IMPLANT
KIT ACCESSORY DA VINCI DISP (KITS) ×1
KIT ACCESSORY DVNC DISP (KITS) ×1 IMPLANT
KIT PINK PAD W/HEAD ARE REST (MISCELLANEOUS) ×2
KIT PINK PAD W/HEAD ARM REST (MISCELLANEOUS) ×1 IMPLANT
LABEL OR SOLS (LABEL) ×2 IMPLANT
NEEDLE HYPO 22GX1.5 SAFETY (NEEDLE) ×2 IMPLANT
NS IRRIG 500ML POUR BTL (IV SOLUTION) ×2 IMPLANT
PACK BASIN MINOR ARMC (MISCELLANEOUS) ×1 IMPLANT
PACK LAP CHOLECYSTECTOMY (MISCELLANEOUS) ×2 IMPLANT
PAD DRESSING TELFA 3X8 NADH (GAUZE/BANDAGES/DRESSINGS) ×1 IMPLANT
PENCIL ELECTRO HAND CTR (MISCELLANEOUS) ×4 IMPLANT
POUCH SPECIMEN RETRIEVAL 10MM (ENDOMECHANICALS) ×2 IMPLANT
PROGRASP ENDOWRIST DA VINCI (INSTRUMENTS) ×1
PROGRASP ENDOWRIST DVNC (INSTRUMENTS) IMPLANT
SOLUTION ELECTROLUBE (MISCELLANEOUS) ×2 IMPLANT
SPONGE LAP 18X18 RF (DISPOSABLE) ×2 IMPLANT
STRAP SAFETY 5IN WIDE (MISCELLANEOUS) ×1 IMPLANT
SUT ETHIBOND NAB MO 7 #0 18IN (SUTURE) ×2 IMPLANT
SUT MNCRL 4-0 (SUTURE) ×1
SUT MNCRL 4-0 27XMFL (SUTURE) ×1
SUT MNCRL AB 4-0 PS2 18 (SUTURE) ×2 IMPLANT
SUT VIC AB 3-0 SH 27 (SUTURE) ×1
SUT VIC AB 3-0 SH 27X BRD (SUTURE) ×1 IMPLANT
SUT VICRYL 0 AB UR-6 (SUTURE) ×3 IMPLANT
SUTURE MNCRL 4-0 27XMF (SUTURE) IMPLANT
SYR 20CC LL (SYRINGE) ×2 IMPLANT
TROCAR 130MM GELPORT  DAV (MISCELLANEOUS) ×2 IMPLANT
TROCAR XCEL NON-BLD 5MMX100MML (ENDOMECHANICALS) ×2 IMPLANT
TUBING INSUF HEATED (TUBING) ×2 IMPLANT

## 2018-09-09 NOTE — Discharge Instructions (Addendum)
Laparoscopic Cholecystectomy, Care After  ° °These instructions give you information on caring for yourself after your procedure. Your doctor may also give you more specific instructions. Call your doctor if you have any problems or questions after your procedure.  °HOME CARE  °Change your bandages (dressings) as told by your doctor.  °Keep the wound dry and clean. Wash the wound gently with soap and water. Pat the wound dry with a clean towel.  °Do not take baths, swim, or use hot tubs for 2 weeks, or as told by your doctor.  °Only take medicine as told by your doctor.  °Eat a normal diet as told by your doctor.  °Do not lift anything heavier than 10 pounds (4.5 kg) until your doctor says it is okay.  °Do not play contact sports for 1 week, or as told by your doctor. °GET HELP IF:  °Your wound is red, puffy (swollen), or painful.  °You have yellowish-white fluid (pus) coming from the wound.  °You have fluid draining from the wound for more than 1 day.  °You have a bad smell coming from the wound.  °Your wound breaks open. °GET HELP RIGHT AWAY IF:  °You have trouble breathing.  °You have chest pain.  °You have a fever >101  °You have pain in the shoulders (shoulder strap areas) that is getting worse.  °You feel dizzy or pass out (faint).  °You have severe belly (abdominal) pain.  °You feel sick to your stomach (nauseous) or throw up (vomit) for more than 1 day. ° ° ° °AMBULATORY SURGERY  °DISCHARGE INSTRUCTIONS ° ° °1) The drugs that you were given will stay in your system until tomorrow so for the next 24 hours you should not: ° °A) Drive an automobile °B) Make any legal decisions °C) Drink any alcoholic beverage ° ° °2) You may resume regular meals tomorrow.  Today it is better to start with liquids and gradually work up to solid foods. ° °You may eat anything you prefer, but it is better to start with liquids, then soup and crackers, and gradually work up to solid foods. ° ° °3) Please notify your doctor  immediately if you have any unusual bleeding, trouble breathing, redness and pain at the surgery site, drainage, fever, or pain not relieved by medication. ° ° ° °4) Additional Instructions: ° ° ° ° ° ° ° °Please contact your physician with any problems or Same Day Surgery at [phone], Monday through Friday 6 am to 4 pm, or [place] [facility] [place] Main number at [phone]. ° ° °

## 2018-09-09 NOTE — Transfer of Care (Signed)
Immediate Anesthesia Transfer of Care Note  Patient: Kelsey Joyce  Procedure(s) Performed: ROBOTIC ASSISTED LAPAROSCOPIC CHOLECYSTECTOMY-MULTI SITE (N/A ) HERNIA REPAIR UMBILICAL ADULT (N/A )  Patient Location: PACU  Anesthesia Type:General  Level of Consciousness: drowsy and patient cooperative  Airway & Oxygen Therapy: Patient Spontanous Breathing and Patient connected to face mask oxygen  Post-op Assessment: Report given to RN and Post -op Vital signs reviewed and stable  Post vital signs: Reviewed and stable  Last Vitals:  Vitals Value Taken Time  BP 122/88 09/09/2018  9:22 AM  Temp    Pulse 92 09/09/2018  9:22 AM  Resp 16 09/09/2018  9:22 AM  SpO2 100 % 09/09/2018  9:22 AM  Vitals shown include unvalidated device data.  Last Pain:  Vitals:   09/09/18 0611  TempSrc: Oral         Complications: No apparent anesthesia complications

## 2018-09-09 NOTE — Anesthesia Preprocedure Evaluation (Signed)
Anesthesia Evaluation  Patient identified by MRN, date of birth, ID band Patient awake    Reviewed: Allergy & Precautions, NPO status , Patient's Chart, lab work & pertinent test results  History of Anesthesia Complications Negative for: history of anesthetic complications  Airway Mallampati: II  TM Distance: >3 FB Neck ROM: Full    Dental no notable dental hx.    Pulmonary neg sleep apnea, neg COPD, Current Smoker,    breath sounds clear to auscultation- rhonchi (-) wheezing      Cardiovascular Exercise Tolerance: Good hypertension, Pt. on medications (-) CAD, (-) Past MI, (-) Cardiac Stents and (-) CABG  Rhythm:Regular Rate:Normal - Systolic murmurs and - Diastolic murmurs    Neuro/Psych Anxiety negative neurological ROS     GI/Hepatic Neg liver ROS, GERD  ,  Endo/Other  negative endocrine ROSneg diabetes  Renal/GU negative Renal ROS     Musculoskeletal negative musculoskeletal ROS (+)   Abdominal (+) - obese,   Peds  Hematology negative hematology ROS (+)   Anesthesia Other Findings Past Medical History: No date: Anxiety No date: Hypertension   Reproductive/Obstetrics                             Anesthesia Physical Anesthesia Plan  ASA: II  Anesthesia Plan: General   Post-op Pain Management:    Induction: Intravenous  PONV Risk Score and Plan: 1 and Dexamethasone, Ondansetron and Midazolam  Airway Management Planned: Oral ETT  Additional Equipment:   Intra-op Plan:   Post-operative Plan: Extubation in OR  Informed Consent: I have reviewed the patients History and Physical, chart, labs and discussed the procedure including the risks, benefits and alternatives for the proposed anesthesia with the patient or authorized representative who has indicated his/her understanding and acceptance.   Dental advisory given  Plan Discussed with: CRNA and  Anesthesiologist  Anesthesia Plan Comments:         Anesthesia Quick Evaluation

## 2018-09-09 NOTE — Anesthesia Postprocedure Evaluation (Signed)
Anesthesia Post Note  Patient: Kelsey Joyce  Procedure(s) Performed: ROBOTIC ASSISTED LAPAROSCOPIC CHOLECYSTECTOMY-MULTI SITE (N/A ) HERNIA REPAIR UMBILICAL ADULT (N/A )  Patient location during evaluation: PACU Anesthesia Type: General Level of consciousness: awake and alert and oriented Pain management: pain level controlled Vital Signs Assessment: post-procedure vital signs reviewed and stable Respiratory status: spontaneous breathing, nonlabored ventilation and respiratory function stable Cardiovascular status: blood pressure returned to baseline and stable Postop Assessment: no signs of nausea or vomiting Anesthetic complications: no     Last Vitals:  Vitals:   09/09/18 1027 09/09/18 1035  BP: 116/77   Pulse: 88 87  Resp: 15 15  Temp:    SpO2: 97% 97%    Last Pain:  Vitals:   09/09/18 1035  TempSrc:   PainSc: Asleep                 Nesanel Aguila

## 2018-09-09 NOTE — Interval H&P Note (Signed)
History and Physical Interval Note:  09/09/2018 7:26 AM  Kelsey Smothersameshia Beahm  has presented today for surgery, with the diagnosis of POLYP  The various methods of treatment have been discussed with the patient and family. After consideration of risks, benefits and other options for treatment, the patient has consented to  Procedure(s): ROBOTIC ASSISTED LAPAROSCOPIC CHOLECYSTECTOMY-MULTI SITE (N/A) HERNIA REPAIR UMBILICAL ADULT (N/A) as a surgical intervention .  The patient's history has been reviewed, patient examined, no change in status, stable for surgery.  I have reviewed the patient's chart and labs.  Questions were answered to the patient's satisfaction.     Diego F Pabon

## 2018-09-09 NOTE — Op Note (Signed)
Robotic assisted Laparoscopic Cholecystectomy + Umbilical hernia repair  Pre-operative Diagnosis: Biliary colic and GB polyp, umbilical hernia  Post-operative Diagnosis: same  Procedure: Robotic assisted laparoscopic cholecystectomy Umbilical hernia repair  Surgeon: Sterling Big, MD FACS  Anesthesia: Gen. with endotracheal tube   Findings: GB No evidence of accessory ducts or bile leak using ICG cholangiography  Estimated Blood Loss: 10cc         Drains: none         Specimens: Gallbladder           Complications: none   Procedure Details  The patient was seen again in the Holding Room. The benefits, complications, treatment options, and expected outcomes were discussed with the patient. The risks of bleeding, infection, recurrence of symptoms, failure to resolve symptoms, bile duct damage, bile duct leak, retained common bile duct stone, bowel injury, any of which could require further surgery and/or ERCP, stent, or papillotomy were reviewed with the patient. The likelihood of improving the patient's symptoms with return to their baseline status is good.  The patient and/or family concurred with the proposed plan, giving informed consent.  The patient was taken to Operating Room, identified as Elowyn Raupp and the procedure verified as Laparoscopic Cholecystectomy.  A Time Out was held and the above information confirmed.  Prior to the induction of general anesthesia, antibiotic prophylaxis was administered. VTE prophylaxis was in place. General endotracheal anesthesia was then administered and tolerated well. After the induction, the abdomen was prepped with Chloraprep and draped in the sterile fashion. The patient was positioned in the supine position.  Billick incision was created with a 15 blade knife and a small umbilical hernia was encountered.  The sac was dissected free from adjacent structures and was excised.  Cut down technique was used to enter the abdominal cavity  and a Hasson trochar was placed after two vicryl stitches were anchored to the fascia. Pneumoperitoneum was then created with CO2 and tolerated well without any adverse changes in the patient's vital signs.  Three 8-mm ports were placed in the right upper quadrant all under direct vision. All skin incisions  were infiltrated with a local anesthetic agent before making the incision and placing the trocars.   The patient was positioned  in reverse Trendelenburg.   The robot was brought to the surgical field and docked in the standard fashion.  We make sure that all instrumentation was kept under direct visualization at all times and there was no evidence of collision between arms.  I then scrubbed out and went to the console  The gallbladder was identified, the fundus grasped and retracted cephalad. Adhesions were lysed bluntly. The infundibulum was grasped and retracted laterally, exposing the peritoneum overlying the triangle of Calot. This was then divided and exposed in a blunt fashion. An extended critical view of the cystic duct and cystic artery was obtained.  The cystic duct was clearly identified and bluntly dissected.   Artery and duct were double clipped and divided.  The gallbladder was taken from the gallbladder fossa in a retrograde fashion with the electrocautery. Hemostasis was achieved with the electrocautery. We performed ICG cholangiography during the dissection, there was no evidence of bile leak, accessory ducts or any other abnormal biliary anatomy.  The gallbladder was removed and placed in an Endocatch bag.  The gallbladder and Endocatch sac were then removed through a port site.  Inspection of the right upper quadrant was performed. No bleeding, bile duct injury or leak, or bowel injury was  noted. Pneumoperitoneum was released.  The umbilical defect was closed with multiple interrupted 0 Ethibond sutures 4-0 subcuticular Monocryl was used to close the skin. Dermabond was   applied.  The patient was then extubated and brought to the recovery room in stable condition. Sponge, lap, and needle counts were correct at closure and at the conclusion of the case.               Sterling Bigiego Ugochukwu Chichester, MD, FACS

## 2018-09-09 NOTE — Anesthesia Procedure Notes (Signed)
Procedure Name: Intubation Date/Time: 09/09/2018 7:39 AM Performed by: Jonna Clark, CRNA Pre-anesthesia Checklist: Patient identified, Patient being monitored, Timeout performed, Emergency Drugs available and Suction available Patient Re-evaluated:Patient Re-evaluated prior to induction Oxygen Delivery Method: Circle system utilized Preoxygenation: Pre-oxygenation with 100% oxygen Induction Type: IV induction Ventilation: Mask ventilation without difficulty Laryngoscope Size: Mac and 3 Grade View: Grade I Tube type: Oral Tube size: 7.0 mm Number of attempts: 1 Placement Confirmation: ETT inserted through vocal cords under direct vision,  positive ETCO2 and breath sounds checked- equal and bilateral Secured at: 21 cm Tube secured with: Tape Dental Injury: Teeth and Oropharynx as per pre-operative assessment

## 2018-09-09 NOTE — Anesthesia Post-op Follow-up Note (Signed)
Anesthesia QCDR form completed.        

## 2018-09-10 ENCOUNTER — Encounter: Payer: Self-pay | Admitting: Surgery

## 2018-09-10 LAB — SURGICAL PATHOLOGY

## 2018-09-24 ENCOUNTER — Ambulatory Visit (INDEPENDENT_AMBULATORY_CARE_PROVIDER_SITE_OTHER): Payer: Self-pay | Admitting: Surgery

## 2018-09-24 ENCOUNTER — Encounter: Payer: Self-pay | Admitting: Surgery

## 2018-09-24 ENCOUNTER — Other Ambulatory Visit: Payer: Self-pay

## 2018-09-24 VITALS — BP 110/82 | HR 79 | Resp 14 | Ht 63.0 in | Wt 127.6 lb

## 2018-09-24 DIAGNOSIS — Z09 Encounter for follow-up examination after completed treatment for conditions other than malignant neoplasm: Secondary | ICD-10-CM

## 2018-09-24 NOTE — Progress Notes (Signed)
S/p rob chole 12/3 Doing very well No pain , no fevers or chills Path d/w pt  PE NAD Abd: soft, nt, incisions c/d/i, no infection  A/ p Doing very well No heavy lifting total of 6 weeks after procedure RTC prn

## 2018-10-03 MED FILL — AMLODIPINE BESYLATE 10 MG T: 10 | 30 days supply | Qty: 30 | Fill #3

## 2018-10-03 MED FILL — POTASSIUM CL ER 20 MEQ TAB: 20 | 30 days supply | Qty: 30 | Fill #3

## 2018-10-03 MED FILL — ?CHLORTHALIDONE 25 MG TABLE: 25 | 30 days supply | Qty: 30 | Fill #0

## 2018-10-06 ENCOUNTER — Ambulatory Visit (INDEPENDENT_AMBULATORY_CARE_PROVIDER_SITE_OTHER): Payer: Self-pay | Admitting: General Surgery

## 2018-10-06 ENCOUNTER — Encounter: Payer: Self-pay | Admitting: General Surgery

## 2018-10-06 ENCOUNTER — Other Ambulatory Visit: Payer: Self-pay

## 2018-10-06 ENCOUNTER — Telehealth: Payer: Self-pay | Admitting: *Deleted

## 2018-10-06 VITALS — BP 110/77 | HR 74 | Temp 97.8°F | Resp 12 | Ht 63.0 in | Wt 130.0 lb

## 2018-10-06 DIAGNOSIS — T148XXA Other injury of unspecified body region, initial encounter: Secondary | ICD-10-CM

## 2018-10-06 DIAGNOSIS — Z09 Encounter for follow-up examination after completed treatment for conditions other than malignant neoplasm: Secondary | ICD-10-CM

## 2018-10-06 MED ORDER — AMOXICILLIN-POT CLAVULANATE 875-125 MG PO TABS: 1.0000 | ORAL_TABLET | Freq: Two times a day (BID) | ORAL | 0 refills | Status: AC

## 2018-10-06 MED FILL — AMOX-CLAV 875-125 MG TABLET: 875-125 | 5 days supply | Qty: 10 | Fill #0

## 2018-10-06 NOTE — Telephone Encounter (Signed)
Patient called and stated that she had surgery on 09/09/18 and in the navel area there is some drainage, patient said its white in color with no odor. She stated that it has also been itching uncontrollably. All of this started on Saturday. Please call and advise.

## 2018-10-06 NOTE — Telephone Encounter (Signed)
Patient had laparoscopic  Cholecystectomy and umbilical hernia 09/09/18.  Saturday she begin to have itching in her navel. She states there is pus but no odor. She denies fever or chills. The glue has fell off.

## 2018-10-06 NOTE — Patient Instructions (Signed)
Return on one week Dr. Everlene FarrierPabon. Rx sent.

## 2018-10-06 NOTE — Progress Notes (Signed)
Patient of Dr. Hurman HornPabon's who called with concerns about her umbilical site.  She had a robot-assisted cholecystectomy 09/09/18 and primary repair (Ethibond) of an umbilical hernia.  She reports white material at the umbilicus, as well as itching. No fevers or chills.   Vitals:   10/06/18 1122  BP: 110/77  Pulse: 74  Resp: 12  Temp: 97.8 F (36.6 C)  SpO2: 98%   On exam, there is fibrinous exudate present.  The wound was probed.  There was a small amount of cloudy drainage and it appears there may be a small opening in the skin, however I was unable to examine it well, due to patient discomfort.  Wound cleaned with betadine solution.  A/P: apparent suture reaction after primary repair of umbilical hernia.  Possible small site infection. 1. PRN benadryl for itching 2. 5 days Augmentin 3. RTC in 1 week for wound check with Dr. Everlene FarrierPabon.

## 2018-10-13 ENCOUNTER — Encounter: Payer: Self-pay | Admitting: Surgery

## 2018-10-13 ENCOUNTER — Ambulatory Visit (INDEPENDENT_AMBULATORY_CARE_PROVIDER_SITE_OTHER): Payer: Self-pay | Admitting: Surgery

## 2018-10-13 ENCOUNTER — Other Ambulatory Visit: Payer: Self-pay

## 2018-10-13 VITALS — BP 116/78 | HR 82 | Temp 97.8°F | Resp 13 | Ht 67.0 in | Wt 121.0 lb

## 2018-10-13 DIAGNOSIS — Z09 Encounter for follow-up examination after completed treatment for conditions other than malignant neoplasm: Secondary | ICD-10-CM

## 2018-10-13 NOTE — Patient Instructions (Addendum)
Return in two weeks. Return to work on 11/03/2018.  The patient is aware to call back for any questions or concerns.

## 2018-10-13 NOTE — Progress Notes (Signed)
S/p [facility] chole 12/3 Developed some wound issues around umbilicus Delayed wound healing Feels better She is anxious   PE NAD Abd: soft, nt, umbilical area fibrinous area, no infection, no peritonitis or hernia recurrence  A/P Delayed wound healing secondary to redundant skin from [facility] repair. Anticipate improvement of wound RTC 2 weeks for final wound check Extend RTC for 3 more weeks since she is having some discomfort and does not feel ready to go back to work

## 2018-10-27 ENCOUNTER — Encounter: Payer: Self-pay | Admitting: Surgery

## 2018-10-27 ENCOUNTER — Ambulatory Visit (INDEPENDENT_AMBULATORY_CARE_PROVIDER_SITE_OTHER): Payer: Self-pay | Admitting: Surgery

## 2018-10-27 ENCOUNTER — Other Ambulatory Visit: Payer: Self-pay

## 2018-10-27 VITALS — BP 126/89 | HR 65 | Temp 98.6°F | Ht 67.0 in | Wt 124.2 lb

## 2018-10-27 DIAGNOSIS — Z09 Encounter for follow-up examination after completed treatment for conditions other than malignant neoplasm: Secondary | ICD-10-CM

## 2018-10-27 NOTE — Progress Notes (Signed)
S/p rob chole and [facility] w some delayed wound healing Doing well No issues No drainage  PE NAD Wound healing well, area of eschar. no infection no hernia recurrence  A/P Doing well RTC prn

## 2018-10-27 NOTE — Progress Notes (Signed)
Patient's disability paper work has been completed and faxed over to [employer]. Patient was advised that paper work be completed by end of business. Original has been placed in scan box and a copy has been made.

## 2018-10-27 NOTE — Patient Instructions (Signed)
Patient is to return to the office as needed.  Call the office with any questions or concerns. 

## 2018-11-03 MED FILL — AMLODIPINE BESYLATE 10 MG T: 10 | 30 days supply | Qty: 30 | Fill #4

## 2018-11-03 MED FILL — ?CHLORTHALIDONE 25 MG TABLE: 25 | 30 days supply | Qty: 30 | Fill #1

## 2018-11-03 MED FILL — POTASSIUM CL ER 20 MEQ TAB: 20 | 30 days supply | Qty: 30 | Fill #4

## 2018-11-13 ENCOUNTER — Ambulatory Visit (HOSPITAL_COMMUNITY)
Admission: EM | Admit: 2018-11-13 | Discharge: 2018-11-13 | Disposition: A | Discharge status: Home / Self Care | Payer: Medicaid Other | Attending: Family Medicine | Admitting: Family Medicine

## 2018-11-13 ENCOUNTER — Encounter (HOSPITAL_COMMUNITY): Payer: Self-pay | Admitting: Emergency Medicine

## 2018-11-13 DIAGNOSIS — J029 Acute pharyngitis, unspecified: Secondary | ICD-10-CM | POA: Insufficient documentation

## 2018-11-13 LAB — POCT RAPID STREP A: Streptococcus, Group A Screen (Direct): NEGATIVE

## 2018-11-13 MED ORDER — DEXAMETHASONE SODIUM PHOSPHATE 10 MG/ML IJ SOLN
10.0000 mg | Freq: Once | INTRAMUSCULAR | Status: AC
  Administered 2018-11-13: 10 mg via INTRAMUSCULAR

## 2018-11-13 MED ORDER — DEXAMETHASONE SODIUM PHOSPHATE 10 MG/ML IJ SOLN
INTRAMUSCULAR | Status: AC
  Filled 2018-11-13: qty 1

## 2018-11-13 NOTE — ED Triage Notes (Signed)
Here for ST onset 3 days ... sx include dysphagia  Denies fevers  A&O x4... NAD.Marland Kitchen. ambulatory

## 2018-11-13 NOTE — Discharge Instructions (Addendum)
Rapid strep test negative Steroid injection given in the clinic for pain and inflammation Follow up as needed for continued or worsening symptoms

## 2018-11-13 NOTE — ED Provider Notes (Signed)
[facility]    CSN: [id number] Arrival date & time: 11/13/18  1432     History   Chief Complaint Chief Complaint  Patient presents with  . Sore Throat    HPI Kelsey Joyce is a 33 y.o. female.   Patient is a 33 year old female that presents with sore throat, laryngitis x3 days.  Symptoms been constant worsening.  She is having severe pain with swallowing.  She denies any trouble swallowing.  She is been taking ibuprofen for her symptoms without much relief.  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  Denies any fevers, myalgias.  She has had slight headache.  Mild cough and congestion.  ROS per HPI      Past Medical History:  Diagnosis Date  . Anxiety   . Hypertension     Patient Active Problem List   Diagnosis Date Noted  . Umbilical hernia without obstruction and without gangrene   . Gall bladder polyp 04/08/2018  . 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 Cold sore 11/16/2016  . Sinus pressure 11/01/2016  . LGSIL on Pap smear of cervix 08/04/2015  . Esophageal reflux 04/25/2015  . Smoking 04/25/2015  . Family history of diabetes mellitus (DM) 04/25/2015  . HYPERTENSION, BENIGN ESSENTIAL 07/08/2009    Past Surgical History:  Procedure Laterality Date  . COLPOSCOPY    . LEEP    . ROBOTIC ASSISTED LAPAROSCOPIC CHOLECYSTECTOMY-MULTI SITE N/A 09/09/2018   Procedure: ROBOTIC ASSISTED LAPAROSCOPIC CHOLECYSTECTOMY-MULTI SITE;  Surgeon: Leafy Ro, MD;  Location: [facility] ORS;  Service: General;  Laterality: N/A;  . UMBILICAL HERNIA REPAIR N/A 09/09/2018   Procedure: HERNIA REPAIR UMBILICAL ADULT;  Surgeon: Leafy Ro, MD;  Location: [facility] ORS;  Service: General;  Laterality: N/A;    OB History    Gravida  0   Para  0   Term  0   Preterm  0   AB  0   Living  0     SAB  0   TAB  0   Ectopic  0   Multiple  0   Live Births  0            Home Medications    Prior to Admission medications     Medication Sig Start Date End Date Taking? Authorizing Provider  amLODipine (NORVASC) 10 MG tablet Take 1 tablet (10 mg total) by mouth daily. Patient taking differently: Take 10 mg by mouth every morning.  06/24/18  Yes Hoy Register, MD  cetirizine (ZYRTEC) 10 MG tablet Take 1 tablet (10 mg total) by mouth daily. Patient taking differently: Take 10 mg by mouth every morning.  02/11/18  Yes Newlin, Odette Horns, MD  fluticasone (FLONASE) 50 MCG/ACT nasal spray Place 2 sprays into both nostrils daily. 08/27/17  Yes Hoy Register, MD  ibuprofen (ADVIL,MOTRIN) 200 MG tablet Take 400 mg by mouth daily as needed for moderate pain.   Yes [provider]  Potassium Chloride ER 20 MEQ TBCR Take 20 mEq by mouth daily. 06/25/18  Yes Hoy Register, MD  chlorthalidone (HYGROTON) 25 MG tablet Take 1 tablet (25 mg total) by mouth daily. 06/24/18   Hoy Register, MD  ibuprofen (ADVIL,MOTRIN) 600 MG tablet Take 1 tablet (600 mg total) by mouth every 12 (twelve) hours as needed for moderate pain. 08/27/17   Hoy Register, MD    Family History Family History  Problem Relation Age of Onset  . Hypertension Mother   . Cervical cancer Other 60  Grandmother  . Hypertension Paternal Aunt   . Breast cancer Other 39       Aunt  . Breast cancer Other        Great Aunt   . Cancer Maternal Grandmother        cerival    Social History Social History   Tobacco Use  . Smoking status: Current Some Day Smoker    Packs/day: 0.20    Years: 5.00    Pack years: 1.00    Types: Cigarettes  . Smokeless tobacco: Former Neurosurgeon    Quit date: 04/23/2018  . Tobacco comment: smoking 1-2 cigs per day  Substance Use Topics  . Alcohol use: Yes    Alcohol/week: 0.0 standard drinks    Comment: occassionally  . Drug use: Not Currently    Types: Marijuana     Allergies   Acetaminophen and Asa [aspirin]   Review of Systems Review of Systems   Physical Exam Triage Vital Signs ED Triage Vitals  Enc  Vitals Group     BP 11/13/18 1442 123/81     Pulse Rate 11/13/18 1442 85     Resp 11/13/18 1442 16     Temp 11/13/18 1442 98.1 F (36.7 C)     Temp Source 11/13/18 1442 Oral     SpO2 11/13/18 1442 100 %     Weight --      Height --      Head Circumference --      Peak Flow --      Pain Score 11/13/18 1444 6     Pain Loc --      Pain Edu? --      Excl. in GC? --    No data found.  Updated Vital Signs BP 123/81 (BP Location: Left Arm)   Pulse 85   Temp 98.1 F (36.7 C) (Oral)   Resp 16   LMP 11/06/2018   SpO2 100%   Visual Acuity Right Eye Distance:   Left Eye Distance:   Bilateral Distance:    Right Eye Near:   Left Eye Near:    Bilateral Near:     Physical Exam Vitals signs and nursing note reviewed.  Constitutional:      General: She is not in acute distress.    Appearance: She is well-developed and normal weight. She is not ill-appearing.  HENT:     Head: Normocephalic and atraumatic.     Right Ear: Tympanic membrane and ear canal normal.     Left Ear: Tympanic membrane and ear canal normal.     Nose: Congestion and rhinorrhea present.     Mouth/Throat:     Mouth: Mucous membranes are moist.     Pharynx: Posterior oropharyngeal erythema present.     Tonsils: Swelling: 1+ on the right. 1+ on the left.  Eyes:     Conjunctiva/sclera: Conjunctivae normal.  Neck:     Musculoskeletal: Neck supple.  Cardiovascular:     Rate and Rhythm: Normal rate and regular rhythm.     Heart sounds: No murmur.  Pulmonary:     Effort: Pulmonary effort is normal. No respiratory distress.     Breath sounds: Normal breath sounds.  Skin:    General: Skin is warm and dry.     Findings: No rash.  Neurological:     Mental Status: She is alert.  Psychiatric:        Mood and Affect: Mood normal.      UC Treatments / Results  Labs (all labs ordered are listed, but only abnormal results are displayed) Labs Reviewed  CULTURE, GROUP A STREP [facility]  POCT RAPID STREP A     EKG None  Radiology No results found.  Procedures Procedures (including critical care time)  Medications Ordered in UC Medications  dexamethasone (DECADRON) injection 10 mg (has no administration in time range)    Initial Impression / Assessment and Plan / UC Course  I have reviewed the triage vital signs and the nursing notes.  Pertinent labs & imaging results that were available during my care of the patient were reviewed by me and considered in my medical decision making (see chart for details).     Rapid strep test was negative we will send for culture Dexamethasone injection given here for throat inflammation and laryngitis symptoms. Follow up as needed for continued or worsening symptoms  Final Clinical Impressions(s) / UC Diagnoses   Final diagnoses:  Viral pharyngitis     Discharge Instructions     Rapid strep test negative Steroid injection given in the clinic for pain and inflammation Follow up as needed for continued or worsening symptoms     ED Prescriptions    None     Controlled Substance Prescriptions [place] Controlled Substance Registry consulted? Not Applicable   Janace ArisBast, Dale Strausser A, NP 11/13/18 1528

## 2018-11-16 LAB — CULTURE, GROUP A STREP (THRC)

## 2018-12-04 MED FILL — ?CHLORTHALIDONE 25 MG TABLE: 25 | 30 days supply | Qty: 30 | Fill #2

## 2018-12-04 MED FILL — POTASSIUM CL ER 20 MEQ TAB: 20 | 30 days supply | Qty: 30 | Fill #5

## 2018-12-04 MED FILL — AMLODIPINE BESYLATE 10 MG T: 10 | 30 days supply | Qty: 30 | Fill #5

## 2019-01-06 ENCOUNTER — Other Ambulatory Visit: Payer: Self-pay | Admitting: Family Medicine

## 2019-01-06 MED FILL — ?CHLORTHALIDONE 25 MG TABLE: 25 | 90 days supply | Qty: 90 | Fill #3

## 2019-01-06 MED FILL — ?AMLODIPINE BESYLATE 10 MG: 10 | 30 days supply | Qty: 30 | Fill #6

## 2019-01-07 MED FILL — POTASSIUM CL ER 20 MEQ TAB: 20 | 90 days supply | Qty: 90 | Fill #0

## 2019-02-06 MED FILL — ?AMLODIPINE BESYLATE 10 MG: 10 | 30 days supply | Qty: 30 | Fill #0

## 2019-03-11 ENCOUNTER — Other Ambulatory Visit: Payer: Self-pay | Admitting: Family Medicine

## 2019-03-11 DIAGNOSIS — I10 Essential (primary) hypertension: Secondary | ICD-10-CM

## 2019-03-19 ENCOUNTER — Ambulatory Visit: Payer: Self-pay | Attending: Family Medicine | Admitting: Physician Assistant

## 2019-03-19 ENCOUNTER — Other Ambulatory Visit: Payer: Self-pay

## 2019-03-19 DIAGNOSIS — I10 Essential (primary) hypertension: Secondary | ICD-10-CM

## 2019-03-19 MED ORDER — AMLODIPINE BESYLATE 10 MG PO TABS: 10.0000 mg | ORAL_TABLET | ORAL | 1 refills | Status: DC

## 2019-03-19 MED ORDER — CHLORTHALIDONE 25 MG PO TABS: 25.0000 mg | ORAL_TABLET | Freq: Every day | ORAL | 1 refills | Status: DC

## 2019-03-19 MED FILL — ?AMLODIPINE BESYLATE 10 MG: 10 | 30 days supply | Qty: 30 | Fill #0

## 2019-03-19 MED FILL — ?CHLORTHALIDONE 25 MG TABLE: 25 | 30 days supply | Qty: 30 | Fill #0

## 2019-03-19 NOTE — Progress Notes (Signed)
Pt. Is requesting BP medication refills.  She stated she's been out for a whole week.   128/80 Pt. BP reading with her home monitor.   Pt. Want 3 months Supply.

## 2019-03-19 NOTE — Progress Notes (Signed)
Patient ID: Nai Borromeo, female   DOB: 1986/03/12, 33 y.o.   MRN: [id number] Virtual Visit via Telephone Note  I connected with Mertha Baars on 03/19/19 at  8:30 AM EDT by telephone and verified that I am speaking with the correct person using two identifiers.   I discussed the limitations, risks, security and privacy concerns of performing an evaluation and management service by telephone and the availability of in person appointments. I also discussed with the patient that there may be a patient responsible charge related to this service. The patient expressed understanding and agreed to proceed.  Patient location:  home My Location:  [place] office Persons on the call:  Myself and the patient  History of Present Illness:  Patient calls needing BP meds.  No HA/CP/dizziness.  She has been out of meds a couple of days and wishes to pick them up today.  BPs at home controlled in 120s/80s.    Observations/Objective: A&Ox3   Assessment and Plan:  1. Essential hypertension Controlled, continue current regimen - amLODipine (NORVASC) 10 MG tablet; Take 1 tablet (10 mg total) by mouth every morning.  Dispense: 90 tablet; Refill: 1 - chlorthalidone (HYGROTON) 25 MG tablet; Take 1 tablet (25 mg total) by mouth daily.  Dispense: 90 tablet; Refill: 1   Follow Up Instructions: Follow-up for labwork and check-in with PCP in 2-3 months   I discussed the assessment and treatment plan with the patient. The patient was provided an opportunity to ask questions and all were answered. The patient agreed with the plan and demonstrated an understanding of the instructions.   The patient was advised to call back or seek an in-person evaluation if the symptoms worsen or if the condition fails to improve as anticipated.  I provided 6 minutes of non-face-to-face time during this encounter.   Freeman Caldron, PA-C

## 2019-04-09 MED FILL — POTASSIUM CL ER 20 MEQ TAB: 20 | 90 days supply | Qty: 90 | Fill #1

## 2019-04-20 MED FILL — ?AMLODIPINE BESYLATE 10 MG: 10 | 30 days supply | Qty: 30 | Fill #1

## 2019-05-11 MED FILL — ?CHLORTHALIDONE 25 MG TABLE: 25 | 30 days supply | Qty: 30 | Fill #1

## 2019-05-18 ENCOUNTER — Other Ambulatory Visit: Payer: Self-pay

## 2019-05-18 ENCOUNTER — Encounter (HOSPITAL_COMMUNITY): Payer: Self-pay | Admitting: Emergency Medicine

## 2019-05-18 DIAGNOSIS — I10 Essential (primary) hypertension: Secondary | ICD-10-CM | POA: Insufficient documentation

## 2019-05-18 DIAGNOSIS — F17210 Nicotine dependence, cigarettes, uncomplicated: Secondary | ICD-10-CM | POA: Insufficient documentation

## 2019-05-18 DIAGNOSIS — K219 Gastro-esophageal reflux disease without esophagitis: Secondary | ICD-10-CM | POA: Insufficient documentation

## 2019-05-18 LAB — I-STAT BETA HCG BLOOD, ED (MC, WL, AP ONLY): I-stat hCG, quantitative: <5 m[IU]/mL

## 2019-05-18 LAB — COMPREHENSIVE METABOLIC PANEL
ALT: 15 U/L (ref 0–44)
AST: 17 U/L (ref 15–41)
Albumin: 4.3 g/dL (ref 3.5–5.0)
Alkaline Phosphatase: 65 U/L (ref 38–126)
Anion gap: 8 (ref 5–15)
BUN: 6 mg/dL (ref 6–20)
CO2: 23 mmol/L (ref 22–32)
Calcium: 9 mg/dL (ref 8.9–10.3)
Chloride: 107 mmol/L (ref 98–111)
Creatinine, Ser: 0.77 mg/dL (ref 0.44–1.00)
GFR calc Af Amer: >60 mL/min (ref >60)
GFR calc non Af Amer: >60 mL/min (ref >60)
Glucose, Bld: 104 mg/dL — ABNORMAL HIGH (ref 70–99)
Potassium: 3 mmol/L — ABNORMAL LOW (ref 3.5–5.1)
Sodium: 138 mmol/L (ref 135–145)
Total Bilirubin: 0.7 mg/dL (ref 0.3–1.2)
Total Protein: 7.9 g/dL (ref 6.5–8.1)

## 2019-05-18 LAB — CBC
HCT: 35.2 % — ABNORMAL LOW (ref 36.0–46.0)
Hemoglobin: 11.8 g/dL — ABNORMAL LOW (ref 12.0–15.0)
MCH: 29.4 pg (ref 26.0–34.0)
MCHC: 33.5 g/dL (ref 30.0–36.0)
MCV: 87.6 fL (ref 80.0–100.0)
Platelets: 242 10*3/uL (ref 150–400)
RBC: 4.02 MIL/uL (ref 3.87–5.11)
RDW: 15.2 % (ref 11.5–15.5)
WBC: 9.2 10*3/uL (ref 4.0–10.5)
nRBC: 0 % (ref 0.0–0.2)

## 2019-05-18 LAB — LIPASE, BLOOD: Lipase: 23 U/L (ref 11–51)

## 2019-05-18 NOTE — ED Triage Notes (Signed)
Per [facility] pt from home for n/v/d for day. Pt reported chest tightness to EMS when pulling up at [place] requesting for oxygen because she is having an anxiety attack. Pt hx hypokalemia and takes potassium supplements.

## 2019-05-18 NOTE — ED Triage Notes (Signed)
Pt states that she isnt having chest pains, just having trouble catching her breath.

## 2019-05-19 ENCOUNTER — Emergency Department (HOSPITAL_COMMUNITY): Payer: Self-pay

## 2019-05-19 ENCOUNTER — Emergency Department (HOSPITAL_COMMUNITY)
Admission: EM | Admit: 2019-05-19 | Discharge: 2019-05-19 | Disposition: A | Discharge status: Home / Self Care | Payer: Self-pay | Attending: Emergency Medicine | Admitting: Emergency Medicine

## 2019-05-19 DIAGNOSIS — K219 Gastro-esophageal reflux disease without esophagitis: Secondary | ICD-10-CM

## 2019-05-19 DIAGNOSIS — R0602 Shortness of breath: Secondary | ICD-10-CM

## 2019-05-19 MED ORDER — LIDOCAINE VISCOUS HCL 2 % MT SOLN
15.0000 mL | Freq: Once | OROMUCOSAL | Status: AC
  Administered 2019-05-19: 15 mL via ORAL
  Filled 2019-05-19: qty 15

## 2019-05-19 MED ORDER — ALUM & MAG HYDROXIDE-SIMETH 200-200-20 MG/5ML PO SUSP
30.0000 mL | Freq: Once | ORAL | Status: AC
  Administered 2019-05-19: 30 mL via ORAL
  Filled 2019-05-19: qty 30

## 2019-05-19 MED ORDER — ONDANSETRON 4 MG PO TBDP
4.0000 mg | ORAL_TABLET | Freq: Once | ORAL | Status: AC
  Administered 2019-05-19: 4 mg via ORAL
  Filled 2019-05-19: qty 1

## 2019-05-19 NOTE — ED Provider Notes (Signed)
[facility] DEPT Provider Note  CSN: [id number] Arrival date & time: 05/18/19 1812  Chief Complaint(s) Emesis, Diarrhea, and Shortness of Breath  HPI Kelsey Joyce is a 33 y.o. female    Chest Pain Pain location:  Substernal area Pain quality: dull   Pain radiates to:  Does not radiate Pain severity:  Mild Onset quality:  Gradual Duration:  2 days Timing:  Constant Progression:  Unchanged Chronicity:  Recurrent Context: eating   Context comment:  Started shorthly after eating steaming rice Relieved by:  Certain positions Worsened by:  Certain positions (eating) Ineffective treatments: apple and tea. Associated symptoms: anxiety, nausea, shortness of breath ('feels like she has to take a deep breath') and vomiting (NBNB)   Associated symptoms: no abdominal pain, no cough, no fatigue, no fever and no headache     Past Medical History Past Medical History:  Diagnosis Date  . Anxiety   . Hypertension    Patient Active Problem List   Diagnosis Date Noted  . Umbilical hernia without obstruction and without gangrene   . Gall bladder polyp 04/08/2018  . 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 Cold sore 11/16/2016  . Sinus pressure 11/01/2016  . LGSIL on Pap smear of cervix 08/04/2015  . Esophageal reflux 04/25/2015  . Smoking 04/25/2015  . Family history of diabetes mellitus (DM) 04/25/2015  . HYPERTENSION, BENIGN ESSENTIAL 07/08/2009   Home Medication(s) Prior to Admission medications   Medication Sig Start Date End Date Taking? Authorizing Provider  amLODipine (NORVASC) 10 MG tablet Take 1 tablet (10 mg total) by mouth every morning. 03/19/19   Argentina Donovan, PA-C  cetirizine (ZYRTEC) 10 MG tablet Take 1 tablet (10 mg total) by mouth daily. Patient taking differently: Take 10 mg by mouth every morning.  02/11/18   Charlott Rakes, MD  chlorthalidone (HYGROTON) 25 MG tablet Take 1 tablet (25 mg total) by  mouth daily. 03/19/19   Argentina Donovan, PA-C  fluticasone (FLONASE) 50 MCG/ACT nasal spray Place 2 sprays into both nostrils daily. Patient not taking: Reported on 03/19/2019 08/27/17   Charlott Rakes, MD  ibuprofen (ADVIL,MOTRIN) 200 MG tablet Take 400 mg by mouth daily as needed for moderate pain.    [provider]  ibuprofen (ADVIL,MOTRIN) 600 MG tablet Take 1 tablet (600 mg total) by mouth every 12 (twelve) hours as needed for moderate pain. Patient not taking: Reported on 03/19/2019 08/27/17   Charlott Rakes, MD  potassium chloride SA (K-DUR,KLOR-CON) 20 MEQ tablet TAKE 1 TABLET BY MOUTH ONCE DAILY 01/06/19   Charlott Rakes, MD                                                                                                                                    Past Surgical History Past Surgical History:  Procedure Laterality Date  . COLPOSCOPY    . LEEP    . ROBOTIC ASSISTED LAPAROSCOPIC CHOLECYSTECTOMY-MULTI  SITE N/A 09/09/2018   Procedure: ROBOTIC ASSISTED LAPAROSCOPIC CHOLECYSTECTOMY-MULTI SITE;  Surgeon: Leafy RoPabon, Diego F, MD;  Location: [facility] ORS;  Service: General;  Laterality: N/A;  . UMBILICAL HERNIA REPAIR N/A 09/09/2018   Procedure: HERNIA REPAIR UMBILICAL ADULT;  Surgeon: Leafy RoPabon, Diego F, MD;  Location: [facility] ORS;  Service: General;  Laterality: N/A;   Family History Family History  Problem Relation Age of Onset  . Hypertension Mother   . Cervical cancer Other 60       Grandmother  . Hypertension Paternal Aunt   . Breast cancer Other 39       Aunt  . Breast cancer Other        Great Aunt   . Cancer Maternal Grandmother        cerival    Social History Social History   Tobacco Use  . Smoking status: Current Some Day Smoker    Packs/day: 0.20    Years: 5.00    Pack years: 1.00    Types: Cigarettes  . Smokeless tobacco: Former NeurosurgeonUser    Quit date: 04/23/2018  . Tobacco comment: smoking 1-2 cigs per day  Substance Use Topics  . Alcohol use: Yes     Alcohol/week: 0.0 standard drinks    Comment: occassionally  . Drug use: Not Currently    Types: Marijuana   Allergies Acetaminophen and Asa [aspirin]  Review of Systems Review of Systems  Constitutional: Negative for fatigue and fever.  Respiratory: Positive for shortness of breath ('feels like she has to take a deep breath'). Negative for cough.   Cardiovascular: Positive for chest pain.  Gastrointestinal: Positive for nausea and vomiting (NBNB). Negative for abdominal pain.  Neurological: Negative for headaches.   All other systems are reviewed and are negative for acute change except as noted in the HPI  Physical Exam Vital Signs  I have reviewed the triage vital signs BP 121/83 (BP Location: Left Arm)   Pulse 63   Temp 98.2 F (36.8 C) (Oral)   Resp 13   Ht 5\' 3"  (1.6 m)   Wt 54.4 kg   LMP 05/04/2019   SpO2 100%   BMI 21.26 kg/m   Physical Exam Vitals signs reviewed.  Constitutional:      General: She is not in acute distress.    Appearance: She is well-developed. She is not diaphoretic.  HENT:     Head: Normocephalic and atraumatic.     Nose: Nose normal.  Eyes:     General: No scleral icterus.       Right eye: No discharge.        Left eye: No discharge.     Conjunctiva/sclera: Conjunctivae normal.     Pupils: Pupils are equal, round, and reactive to light.  Neck:     Musculoskeletal: Normal range of motion and neck supple.  Cardiovascular:     Rate and Rhythm: Normal rate and regular rhythm.     Heart sounds: No murmur. No friction rub. No gallop.   Pulmonary:     Effort: Pulmonary effort is normal. No respiratory distress.     Breath sounds: Normal breath sounds. No stridor. No rales.  Abdominal:     General: There is no distension.     Palpations: Abdomen is soft.     Tenderness: There is no abdominal tenderness.  Musculoskeletal:        General: No tenderness.  Skin:    General: Skin is warm and dry.     Findings: No erythema or  rash.   Neurological:     Mental Status: She is alert and oriented to person, place, and time.     ED Results and Treatments Labs (all labs ordered are listed, but only abnormal results are displayed) Labs Reviewed  COMPREHENSIVE METABOLIC PANEL - Abnormal; Notable for the following components:      Result Value   Potassium 3.0 (*)    Glucose, Bld 104 (*)    All other components within normal limits  CBC - Abnormal; Notable for the following components:   Hemoglobin 11.8 (*)    HCT 35.2 (*)    All other components within normal limits  LIPASE, BLOOD  URINALYSIS, ROUTINE W REFLEX MICROSCOPIC  I-STAT BETA HCG BLOOD, ED ([facility], [facility], [facility] ONLY)                                                                                                                         EKG  EKG Interpretation  Date/Time:  Monday May 18 2019 18:25:13 EDT Ventricular Rate:  70 PR Interval:    QRS Duration: 94 QT Interval:  422 QTC Calculation: 456 R Axis:   77 Text Interpretation:  Sinus rhythm No significant change since last tracing Confirmed by Drema Pryardama, Pedro [phone]([id number]) on 05/19/2019 12:42:27 AM      Radiology No results found.  Pertinent labs & imaging results that were available during my care of the patient were reviewed by me and considered in my medical decision making (see chart for details).  Medications Ordered in ED Medications - No data to display                                                                                                                                  Procedures Procedures  (including critical care time)  Medical Decision Making / ED Course I have reviewed the nursing notes for this encounter and the patient's prior records (if available in EHR or on provided paperwork).   Kelsey Joyce was evaluated in Emergency Department on 05/19/2019 for the symptoms described in the history of present illness. She was evaluated in the context of the global COVID-19 pandemic, which  necessitated consideration that the patient might be at risk for infection with the SARS-CoV-2 virus that causes COVID-19. Institutional protocols and algorithms that pertain to the evaluation of patients at risk for COVID-19 are in a state of rapid change based on information released by  regulatory bodies including the CDC and federal and state organizations. These policies and algorithms were followed during the patient's care in the ED.  Highly atypical chest pain.  No suspicious for GERD.  Low suspicion for ACS.  EKG without acute ischemic changes or evidence of pericarditis.  PERC negative.  Doubt PE.  Not classic for aortic dissection or esophageal perforation.  Chest x-ray without evidence suggestive of pneumonia, pneumothorax, pneumomediastinum.  No abnormal contour of the mediastinum to suggest dissection. No evidence of acute injuries.  Labs grossly reassuring without significant electrolyte derangements other than no mild hypokalemia.  No renal insufficiency.  No anemia or leukocytosis.  No evidence of biliary obstruction or pancreatitis.   Treated with GI cocktail resulting in significant symptom relief.  The patient appears reasonably screened and/or stabilized for discharge and I doubt any other medical condition or other [facility] requiring further screening, evaluation, or treatment in the ED at this time prior to discharge.  The patient is safe for discharge with strict return precautions.      Final Clinical Impression(s) / ED Diagnoses Final diagnoses:  None     The patient appears reasonably screened and/or stabilized for discharge and I doubt any other medical condition or other [facility] requiring further screening, evaluation, or treatment in the ED at this time prior to discharge.  Disposition: Discharge  Condition: Good  I have discussed the results, Dx and Tx plan with the patient who expressed understanding and agree(s) with the plan. Discharge instructions discussed at  great length. The patient was given strict return precautions who verbalized understanding of the instructions. No further questions at time of discharge.    ED Discharge Orders    None      Follow Up: Hoy RegisterNewlin, Enobong, MD [address] [place] [id number] [phone]  Schedule an appointment as soon as possible for a visit  As needed, If symptoms do not improve or  worsen     This chart was dictated using voice recognition software.  Despite best efforts to proofread,  errors can occur which can change the documentation meaning.   Nira Connardama, Pedro Eduardo, MD 05/19/19 [phone]

## 2019-05-19 NOTE — ED Notes (Signed)
Pt. Documented in error see note in chart. 

## 2019-05-20 ENCOUNTER — Ambulatory Visit (HOSPITAL_COMMUNITY)
Admission: EM | Admit: 2019-05-20 | Discharge: 2019-05-20 | Disposition: A | Discharge status: Home / Self Care | Payer: Self-pay | Attending: Family Medicine | Admitting: Family Medicine

## 2019-05-20 ENCOUNTER — Encounter: Payer: Self-pay | Admitting: Family Medicine

## 2019-05-20 ENCOUNTER — Encounter (HOSPITAL_COMMUNITY): Payer: Self-pay | Admitting: Emergency Medicine

## 2019-05-20 ENCOUNTER — Other Ambulatory Visit: Payer: Self-pay

## 2019-05-20 DIAGNOSIS — R221 Localized swelling, mass and lump, neck: Secondary | ICD-10-CM

## 2019-05-20 NOTE — Discharge Instructions (Signed)
Please call your doctor to discuss having an ultrasound of your neck.

## 2019-05-20 NOTE — ED Triage Notes (Signed)
Pt sts recently seen in ED and now having tightness in throat per pt; pt denies pain, itching or trouble swallowing

## 2019-05-23 NOTE — ED Provider Notes (Signed)
[facility]   [id number] 05/20/19 Arrival Time: 1812  ASSESSMENT & PLAN:  1. Neck swelling   2. Mass of left side of neck     He will need an U/S of his neck and thyroid testing. He plans to contact his PCP to arrange. No indication for ED evaluation tonight. No airway compromise. Normal swallowing and breathing.  Follow-up Information    Schedule an appointment as soon as possible for a visit  with Hoy RegisterNewlin, Enobong, MD.   Specialty: Family Medicine Contact information: [address]  [place] [address] [phone]        [facility].   Specialty: Emergency Medicine Why: If symptoms worsen in any way. Contact information: [address] [id number] [place] [id number] [phone]          Reviewed expectations re: course of current medical issues. Questions answered. Outlined signs and symptoms indicating need for more acute intervention. Patient verbalized understanding. After Visit Summary given.   SUBJECTIVE: History from: patient. Kelsey Joyce is a 33 y.o. female who presents with complaint of "feeling like my neck or throat is swollen"; anterior. First noticed over the past week "maybe'. No specific swallowing or respiratory difficulties. No recent illnesses. Afebrile. No rapid worsening of current symptoms. No specific aggravating or alleviating factors reported. No new medications. No OTC treatment. See in ED 05/19/2019; note reviewed.  ROS: As per HPI. All other systems negative.    OBJECTIVE:  Vitals:   05/20/19 1853  BP: 120/80  Pulse: 81  Resp: 18  Temp: 98.1 F (36.7 C)  TempSrc: Oral  SpO2: 99%    General appearance: alert; no distress Eyes: PERRLA; EOMI; conjunctiva normal HENT: normocephalic; atraumatic; TMs normal; nasal mucosa normal; oral mucosa normal Neck: supple with FROM; no LAD; does appear to have subtle swelling of L anterior neck with mild TTP;  no overlying erythema Lungs: clear to auscultation bilaterally; unlabored Heart: regular rate and rhythm without murmer Extremities: no edema; symmetrical Skin: warm and dry Neurologic: normal gait Psychological: alert and cooperative; normal mood and affect  Labs Reviewed: Results for orders placed or performed during the hospital encounter of 05/19/19  Lipase, blood  Result Value Ref Range   Lipase 23 11 - 51 U/L  Comprehensive metabolic panel  Result Value Ref Range   Sodium 138 135 - 145 mmol/L   Potassium 3.0 (L) 3.5 - 5.1 mmol/L   Chloride 107 98 - 111 mmol/L   CO2 23 22 - 32 mmol/L   Glucose, Bld 104 (H) 70 - 99 mg/dL   BUN 6 6 - 20 mg/dL   Creatinine, Ser 0.340.77 0.44 - 1.00 mg/dL   Calcium 9.0 8.9 - 74.210.3 mg/dL   Total Protein 7.9 6.5 - 8.1 g/dL   Albumin 4.3 3.5 - 5.0 g/dL   AST 17 15 - 41 U/L   ALT 15 0 - 44 U/L   Alkaline Phosphatase 65 38 - 126 U/L   Total Bilirubin 0.7 0.3 - 1.2 mg/dL   GFR calc non Af Amer >60 >60 mL/min   GFR calc Af Amer >60 >60 mL/min   Anion gap 8 5 - 15  CBC  Result Value Ref Range   WBC 9.2 4.0 - 10.5 K/uL   RBC 4.02 3.87 - 5.11 MIL/uL   Hemoglobin 11.8 (L) 12.0 - 15.0 g/dL   HCT 59.535.2 (L) 63.836.0 - 75.646.0 %   MCV 87.6 80.0 - 100.0 fL   MCH 29.4 26.0 -  34.0 pg   MCHC 33.5 30.0 - 36.0 g/dL   RDW 15.2 11.5 - 15.5 %   Platelets 242 150 - 400 K/uL   nRBC 0.0 0.0 - 0.2 %  I-Stat beta hCG blood, ED  Result Value Ref Range   I-stat hCG, quantitative <5.0 <5 mIU/mL   Comment 3             Allergies  Allergen Reactions  . Acetaminophen Nausea Only  . Asa [Aspirin] Nausea Only    Past Medical History:  Diagnosis Date  . Anxiety   . Hypertension    Social History   Socioeconomic History  . Marital status: Single    Spouse name: Not on file  . Number of children: Not on file  . Years of education: Not on file  . Highest education level: Not on file  Occupational History  . Not on file  Social Needs  . Financial resource strain:  Not on file  . Food insecurity    Worry: Not on file    Inability: Not on file  . Transportation needs    Medical: Not on file    Non-medical: Not on file  Tobacco Use  . Smoking status: Current Some Day Smoker    Packs/day: 0.20    Years: 5.00    Pack years: 1.00    Types: Cigarettes  . Smokeless tobacco: Former Systems developer    Quit date: 04/23/2018  . Tobacco comment: smoking 1-2 cigs per day  Substance and Sexual Activity  . Alcohol use: Yes    Alcohol/week: 0.0 standard drinks    Comment: occassionally  . Drug use: Not Currently    Types: Marijuana  . Sexual activity: Yes    Birth control/protection: Condom  Lifestyle  . Physical activity    Days per week: Not on file    Minutes per session: Not on file  . Stress: Not on file  Relationships  . Social Herbalist on phone: Not on file    Gets together: Not on file    Attends religious service: Not on file    Active member of club or organization: Not on file    Attends meetings of clubs or organizations: Not on file    Relationship status: Not on file  . Intimate partner violence    Fear of current or ex partner: Not on file    Emotionally abused: Not on file    Physically abused: Not on file    Forced sexual activity: Not on file  Other Topics Concern  . Not on file  Social History Narrative  . Not on file   Family History  Problem Relation Age of Onset  . Hypertension Mother   . Cervical cancer Other 21       Grandmother  . Hypertension Paternal Aunt   . Breast cancer Other 66       Aunt  . Breast cancer Other        Great Aunt   . Cancer Maternal Grandmother        cerival   Past Surgical History:  Procedure Laterality Date  . COLPOSCOPY    . LEEP    . ROBOTIC ASSISTED LAPAROSCOPIC CHOLECYSTECTOMY-MULTI SITE N/A 09/09/2018   Procedure: ROBOTIC ASSISTED LAPAROSCOPIC CHOLECYSTECTOMY-MULTI SITE;  Surgeon: Jules Husbands, MD;  Location: [facility] ORS;  Service: General;  Laterality: N/A;  . UMBILICAL  HERNIA REPAIR N/A 09/09/2018   Procedure: HERNIA REPAIR UMBILICAL ADULT;  Surgeon: Jules Husbands,  MD;  Location: [facility] ORS;  Service: General;  Laterality: Vertis KelchN/A;     Tristine Langi, MD 05/23/19 [phone]

## 2019-05-25 MED FILL — ?AMLODIPINE BESYLATE 10 MG: 10 | 30 days supply | Qty: 30 | Fill #2

## 2019-06-03 ENCOUNTER — Encounter: Payer: Self-pay | Admitting: Family Medicine

## 2019-06-03 ENCOUNTER — Other Ambulatory Visit: Payer: Self-pay

## 2019-06-03 ENCOUNTER — Ambulatory Visit: Payer: Self-pay | Attending: Family Medicine | Admitting: Family Medicine

## 2019-06-03 VITALS — BP 112/75 | HR 77 | Temp 98.2°F | Ht 67.0 in | Wt 123.0 lb

## 2019-06-03 DIAGNOSIS — R221 Localized swelling, mass and lump, neck: Secondary | ICD-10-CM

## 2019-06-03 NOTE — Progress Notes (Signed)
Subjective:  Patient ID: Kelsey Joyce, female    DOB: Jul 15, 1986  Age: 33 y.o. MRN: [id number]  CC: Hypertension   HPI Kelsey Joyce is a 33 year old female with a history of hypertension who presents today after an ED visit with complaints of neck swelling on 05/20/2019 and was referred to her PCP for thyroid evaluation. She has felt like her throat was closing up, complains she cannot get enough air in and has to "take double breaths".  She denies dysphagia, voice hoarseness, problems with her hair, weight loss, diarrhea, constipation. States her mom has a thyroid condition and one other family member.  Past Medical History:  Diagnosis Date  . Anxiety   . Hypertension     Past Surgical History:  Procedure Laterality Date  . COLPOSCOPY    . LEEP    . ROBOTIC ASSISTED LAPAROSCOPIC CHOLECYSTECTOMY-MULTI SITE N/A 09/09/2018   Procedure: ROBOTIC ASSISTED LAPAROSCOPIC CHOLECYSTECTOMY-MULTI SITE;  Surgeon: Leafy RoPabon, Diego F, MD;  Location: [facility] ORS;  Service: General;  Laterality: N/A;  . UMBILICAL HERNIA REPAIR N/A 09/09/2018   Procedure: HERNIA REPAIR UMBILICAL ADULT;  Surgeon: Leafy RoPabon, Diego F, MD;  Location: [facility] ORS;  Service: General;  Laterality: N/A;    Family History  Problem Relation Age of Onset  . Hypertension Mother   . Cervical cancer Other 60       Grandmother  . Hypertension Paternal Aunt   . Breast cancer Other 39       Aunt  . Breast cancer Other        Great Aunt   . Cancer Maternal Grandmother        cerival    Allergies  Allergen Reactions  . Acetaminophen Nausea Only  . Asa [Aspirin] Nausea Only    Outpatient Medications Prior to Visit  Medication Sig Dispense Refill  . amLODipine (NORVASC) 10 MG tablet Take 1 tablet (10 mg total) by mouth every morning. 90 tablet 1  . chlorthalidone (HYGROTON) 25 MG tablet Take 1 tablet (25 mg total) by mouth daily. 90 tablet 1  . ibuprofen (ADVIL,MOTRIN) 200 MG tablet Take 400 mg by mouth daily as needed for  moderate pain.    . potassium chloride SA (K-DUR,KLOR-CON) 20 MEQ tablet TAKE 1 TABLET BY MOUTH ONCE DAILY 30 tablet 5  . cetirizine (ZYRTEC) 10 MG tablet Take 1 tablet (10 mg total) by mouth daily. (Patient not taking: Reported on 06/03/2019) 30 tablet 6  . fluticasone (FLONASE) 50 MCG/ACT nasal spray Place 2 sprays into both nostrils daily. (Patient not taking: Reported on 03/19/2019) 16 g 6  . ibuprofen (ADVIL,MOTRIN) 600 MG tablet Take 1 tablet (600 mg total) by mouth every 12 (twelve) hours as needed for moderate pain. (Patient not taking: Reported on 03/19/2019) 30 tablet 0   No facility-administered medications prior to visit.      ROS Review of Systems  Constitutional: Negative for activity change, appetite change and fatigue.  HENT: Negative for congestion, sinus pressure and sore throat.   Eyes: Negative for visual disturbance.  Respiratory: Negative for cough, chest tightness, shortness of breath and wheezing.   Cardiovascular: Negative for chest pain and palpitations.  Gastrointestinal: Negative for abdominal distention, abdominal pain and constipation.  Endocrine: Negative for polydipsia.  Genitourinary: Negative for dysuria and frequency.  Musculoskeletal: Negative for arthralgias and back pain.  Skin: Negative for rash.  Neurological: Negative for tremors, light-headedness and numbness.  Hematological: Does not bruise/bleed easily.  Psychiatric/Behavioral: Negative for agitation and behavioral problems.    Objective:  BP 112/75   Pulse 77   Temp 98.2 F (36.8 C) (Oral)   Ht 5\' 7"  (1.702 m)   Wt 123 lb (55.8 kg)   LMP 05/04/2019   SpO2 97%   BMI 19.26 kg/m   BP/Weight 06/03/2019 05/20/2019 5/80/9983  Systolic BP 382 505 397  Diastolic BP 75 80 85  Wt. (Lbs) 123 - 120  BMI 19.26 - 21.26      Physical Exam Constitutional:      Appearance: She is well-developed.  Neck:     Musculoskeletal: No neck rigidity or muscular tenderness.     Comments: Slight  sternocleidomastoid swelling on the left Cardiovascular:     Rate and Rhythm: Normal rate.     Heart sounds: Normal heart sounds. No murmur.  Pulmonary:     Effort: Pulmonary effort is normal.     Breath sounds: Normal breath sounds. No wheezing or rales.  Chest:     Chest wall: No tenderness.  Abdominal:     General: Bowel sounds are normal. There is no distension.     Palpations: Abdomen is soft. There is no mass.     Tenderness: There is no abdominal tenderness.  Musculoskeletal: Normal range of motion.  Lymphadenopathy:     Cervical: No cervical adenopathy.  Neurological:     Mental Status: She is alert and oriented to person, place, and time.     CMP Latest Ref Rng & Units 05/18/2019 09/03/2018 06/24/2018  Glucose 70 - 99 mg/dL 104(H) - 73  BUN 6 - 20 mg/dL 6 - 6  Creatinine 0.44 - 1.00 mg/dL 0.77 - 0.79  Sodium 135 - 145 mmol/L 138 - 141  Potassium 3.5 - 5.1 mmol/L 3.0(L) 3.6 3.1(L)  Chloride 98 - 111 mmol/L 107 - 100  CO2 22 - 32 mmol/L 23 - 24  Calcium 8.9 - 10.3 mg/dL 9.0 - 9.1  Total Protein 6.5 - 8.1 g/dL 7.9 - -  Total Bilirubin 0.3 - 1.2 mg/dL 0.7 - -  Alkaline Phos 38 - 126 U/L 65 - -  AST 15 - 41 U/L 17 - -  ALT 0 - 44 U/L 15 - -    Lipid Panel     Component Value Date/Time   CHOL 117 02/11/2018 1104   TRIG 42 02/11/2018 1104   HDL 52 02/11/2018 1104   CHOLHDL 2.3 02/11/2018 1104   CHOLHDL 3.2 Ratio 10/14/2009 2108   VLDL 9 10/14/2009 2108   LDLCALC 57 02/11/2018 1104    CBC    Component Value Date/Time   WBC 9.2 05/18/2019 2021   RBC 4.02 05/18/2019 2021   HGB 11.8 (L) 05/18/2019 2021   HCT 35.2 (L) 05/18/2019 2021   PLT 242 05/18/2019 2021   MCV 87.6 05/18/2019 2021   MCH 29.4 05/18/2019 2021   MCHC 33.5 05/18/2019 2021   RDW 15.2 05/18/2019 2021   LYMPHSABS 4.3 (H) 04/08/2018 1521   MONOABS 0.4 04/08/2018 1521   EOSABS 0.0 04/08/2018 1521   BASOSABS 0.0 04/08/2018 1521    Lab Results  Component Value Date   HGBA1C 5.8 (H) 04/25/2015     Assessment & Plan:   1. Neck swelling - T4, free - TSH - US Soft Tissue Head/Neck; Future    No orders of the defined types were placed in this encounter.   Follow-up: Return if symptoms worsen or fail to improve.       Charlott Rakes, MD, FAAFP. [facility] [place], [place]  06/03/2019, 10:22 AM

## 2019-06-03 NOTE — Progress Notes (Signed)
Patient was seen in the urgent care for neck swelling and they informed her to follow up with PCP.

## 2019-06-04 LAB — TSH: TSH: 0.999 u[IU]/mL (ref 0.450–4.500)

## 2019-06-04 LAB — T4, FREE: Free T4: 1.34 ng/dL (ref 0.82–1.77)

## 2019-06-05 ENCOUNTER — Other Ambulatory Visit: Payer: Self-pay | Admitting: Family Medicine

## 2019-06-05 ENCOUNTER — Ambulatory Visit
Admission: RE | Admit: 2019-06-05 | Discharge: 2019-06-05 | Disposition: A | Discharge status: Home / Self Care | Payer: Medicaid Other | Source: Ambulatory Visit | Attending: Family Medicine | Admitting: Family Medicine

## 2019-06-05 DIAGNOSIS — R221 Localized swelling, mass and lump, neck: Secondary | ICD-10-CM

## 2019-06-08 ENCOUNTER — Encounter: Payer: Self-pay | Admitting: Family Medicine

## 2019-06-10 ENCOUNTER — Telehealth: Payer: Self-pay

## 2019-06-10 NOTE — Telephone Encounter (Signed)
-----   Message from Charlott Rakes, MD sent at 06/04/2019  1:56 PM EDT ----- Please inform the patient that labs are normal. Thank you.

## 2019-06-10 NOTE — Telephone Encounter (Signed)
Patient name and DOB has been verified Patient was informed of lab results. Patient had no questions.  

## 2019-06-22 ENCOUNTER — Ambulatory Visit: Payer: Self-pay | Attending: Family Medicine | Admitting: Family Medicine

## 2019-06-22 ENCOUNTER — Encounter: Payer: Self-pay | Admitting: Family Medicine

## 2019-06-22 ENCOUNTER — Other Ambulatory Visit (HOSPITAL_COMMUNITY)
Admission: RE | Admit: 2019-06-22 | Discharge: 2019-06-22 | Disposition: A | Discharge status: Home / Self Care | Payer: BC Managed Care – PPO | Source: Ambulatory Visit | Attending: Family Medicine | Admitting: Family Medicine

## 2019-06-22 ENCOUNTER — Other Ambulatory Visit: Payer: Self-pay

## 2019-06-22 VITALS — BP 122/83 | HR 89 | Temp 98.5°F | Ht 67.0 in | Wt 125.6 lb

## 2019-06-22 DIAGNOSIS — Z1151 Encounter for screening for human papillomavirus (HPV): Secondary | ICD-10-CM | POA: Diagnosis not present

## 2019-06-22 DIAGNOSIS — Z124 Encounter for screening for malignant neoplasm of cervix: Secondary | ICD-10-CM | POA: Diagnosis present

## 2019-06-22 DIAGNOSIS — I10 Essential (primary) hypertension: Secondary | ICD-10-CM

## 2019-06-22 MED ORDER — AMLODIPINE BESYLATE 10 MG PO TABS: 10.0000 mg | ORAL_TABLET | ORAL | 1 refills | Status: DC

## 2019-06-22 MED ORDER — CHLORTHALIDONE 25 MG PO TABS: 25.0000 mg | ORAL_TABLET | Freq: Every day | ORAL | 1 refills | Status: DC

## 2019-06-22 MED FILL — ?CHLORTHALIDONE 25 MG TABLE: 25 | 30 days supply | Qty: 30 | Fill #0

## 2019-06-22 MED FILL — ?AMLODIPINE BESYLATE 10 MG: 10 | 30 days supply | Qty: 30 | Fill #0

## 2019-06-22 NOTE — Patient Instructions (Signed)

## 2019-06-22 NOTE — Progress Notes (Signed)
Patient needs refill on medications. 

## 2019-06-22 NOTE — Progress Notes (Signed)
Subjective:  Patient ID: Kelsey Joyce, female    DOB: Mar 09, 1986  Age: 33 y.o. MRN: [id number]  CC: Gynecologic Exam   HPI Kelsey Joyce is a 33 year old female with history of hypertension who presents today for a Pap smear. 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  She has no concerns today. Requests a refill of her chronic medications for hypertension Past Medical History:  Diagnosis Date  . Anxiety   . Hypertension     Past Surgical History:  Procedure Laterality Date  . COLPOSCOPY    . LEEP    . ROBOTIC ASSISTED LAPAROSCOPIC CHOLECYSTECTOMY-MULTI SITE N/A 09/09/2018   Procedure: ROBOTIC ASSISTED LAPAROSCOPIC CHOLECYSTECTOMY-MULTI SITE;  Surgeon: Jules Husbands, MD;  Location: [facility] ORS;  Service: General;  Laterality: N/A;  . UMBILICAL HERNIA REPAIR N/A 09/09/2018   Procedure: HERNIA REPAIR UMBILICAL ADULT;  Surgeon: Jules Husbands, MD;  Location: [facility] ORS;  Service: General;  Laterality: N/A;    Family History  Problem Relation Age of Onset  . Hypertension Mother   . Cervical cancer Other 82       Grandmother  . Hypertension Paternal Aunt   . Breast cancer Other 26       Aunt  . Breast cancer Other        Great Aunt   . Cancer Maternal Grandmother        cerival    Allergies  Allergen Reactions  . Acetaminophen Nausea Only  . Asa [Aspirin] Nausea Only    Outpatient Medications Prior to Visit  Medication Sig Dispense Refill  . ibuprofen (ADVIL,MOTRIN) 200 MG tablet Take 400 mg by mouth daily as needed for moderate pain.    . potassium chloride SA (K-DUR,KLOR-CON) 20 MEQ tablet TAKE 1 TABLET BY MOUTH ONCE DAILY 30 tablet 5  . amLODipine (NORVASC) 10 MG tablet Take 1 tablet (10 mg total) by mouth every morning. 90 tablet 1  . chlorthalidone (HYGROTON) 25 MG tablet Take 1 tablet (25 mg total) by mouth daily. 90 tablet 1  . cetirizine (ZYRTEC) 10 MG tablet Take 1 tablet (10 mg total) by mouth daily.  (Patient not taking: Reported on 06/03/2019) 30 tablet 6  . fluticasone (FLONASE) 50 MCG/ACT nasal spray Place 2 sprays into both nostrils daily. (Patient not taking: Reported on 03/19/2019) 16 g 6  . ibuprofen (ADVIL,MOTRIN) 600 MG tablet Take 1 tablet (600 mg total) by mouth every 12 (twelve) hours as needed for moderate pain. (Patient not taking: Reported on 03/19/2019) 30 tablet 0   No facility-administered medications prior to visit.      ROS Review of Systems  Constitutional: Negative for activity change, appetite change and fatigue.  HENT: Negative for congestion, sinus pressure and sore throat.   Eyes: Negative for visual disturbance.  Respiratory: Negative for cough, chest tightness, shortness of breath and wheezing.   Cardiovascular: Negative for chest pain and palpitations.  Gastrointestinal: Negative for abdominal distention, abdominal pain and constipation.  Endocrine: Negative for polydipsia.  Genitourinary: Negative for dysuria and frequency.  Musculoskeletal: Negative for arthralgias and back pain.  Skin: Negative for rash.  Neurological: Negative for tremors, light-headedness and numbness.  Hematological: Does not bruise/bleed easily.  Psychiatric/Behavioral: Negative for agitation and behavioral problems.    Objective:  BP 122/83   Pulse 89   Temp 98.5 F (36.9 C) (Oral)   Ht 5\' 7"  (1.702 m)   Wt 125 lb 9.6 oz (57 kg)  SpO2 99%   BMI 19.67 kg/m   BP/Weight 06/22/2019 06/03/2019 05/20/2019  Systolic BP 122 112 120  Diastolic BP 83 75 80  Wt. (Lbs) 125.6 123 -  BMI 19.67 19.26 -      Physical Exam Constitutional:      Appearance: She is well-developed.  Cardiovascular:     Rate and Rhythm: Normal rate.     Heart sounds: Normal heart sounds. No murmur.  Pulmonary:     Effort: Pulmonary effort is normal.     Breath sounds: Normal breath sounds. No wheezing or rales.  Chest:     Chest wall: No tenderness.  Abdominal:     General: Bowel sounds are  normal. There is no distension.     Palpations: Abdomen is soft. There is no mass.     Tenderness: There is no abdominal tenderness.  Musculoskeletal: Normal range of motion.  Neurological:     Mental Status: She is alert and oriented to person, place, and time.     CMP Latest Ref Rng & Units 05/18/2019 09/03/2018 06/24/2018  Glucose 70 - 99 mg/dL 702(O) - 73  BUN 6 - 20 mg/dL 6 - 6  Creatinine 3.78 - 1.00 mg/dL 5.88 - 5.02  Sodium 774 - 145 mmol/L 138 - 141  Potassium 3.5 - 5.1 mmol/L 3.0(L) 3.6 3.1(L)  Chloride 98 - 111 mmol/L 107 - 100  CO2 22 - 32 mmol/L 23 - 24  Calcium 8.9 - 10.3 mg/dL 9.0 - 9.1  Total Protein 6.5 - 8.1 g/dL 7.9 - -  Total Bilirubin 0.3 - 1.2 mg/dL 0.7 - -  Alkaline Phos 38 - 126 U/L 65 - -  AST 15 - 41 U/L 17 - -  ALT 0 - 44 U/L 15 - -    Lipid Panel     Component Value Date/Time   CHOL 117 02/11/2018 1104   TRIG 42 02/11/2018 1104   HDL 52 02/11/2018 1104   CHOLHDL 2.3 02/11/2018 1104   CHOLHDL 3.2 Ratio 10/14/2009 2108   VLDL 9 10/14/2009 2108   LDLCALC 57 02/11/2018 1104    CBC    Component Value Date/Time   WBC 9.2 05/18/2019 2021   RBC 4.02 05/18/2019 2021   HGB 11.8 (L) 05/18/2019 2021   HCT 35.2 (L) 05/18/2019 2021   PLT 242 05/18/2019 2021   MCV 87.6 05/18/2019 2021   MCH 29.4 05/18/2019 2021   MCHC 33.5 05/18/2019 2021   RDW 15.2 05/18/2019 2021   LYMPHSABS 4.3 (H) 04/08/2018 1521   MONOABS 0.4 04/08/2018 1521   EOSABS 0.0 04/08/2018 1521   BASOSABS 0.0 04/08/2018 1521    Lab Results  Component Value Date   HGBA1C 5.8 (H) 04/25/2015    Assessment & Plan:   1. Essential hypertension Controlled Counseled on blood pressure goal of less than 130/80, low-sodium, DASH diet, medication compliance, 150 minutes of moderate intensity exercise per week. Discussed medication compliance, adverse effects. - amLODipine (NORVASC) 10 MG tablet; Take 1 tablet (10 mg total) by mouth every morning.  Dispense: 90 tablet; Refill: 1 -  chlorthalidone (HYGROTON) 25 MG tablet; Take 1 tablet (25 mg total) by mouth daily.  Dispense: 90 tablet; Refill: 1  2. Screening for cervical cancer Previous history of LSIL, HPV+, status post LEEP - Cytology - PAP([facility]) - Cervicovaginal ancillary only   Health Care Maintenance: Advised and encouraged to take the flu shot and she is willing to receive today. Meds ordered this encounter  Medications  . amLODipine (  NORVASC) 10 MG tablet    Sig: Take 1 tablet (10 mg total) by mouth every morning.    Dispense:  90 tablet    Refill:  1  . chlorthalidone (HYGROTON) 25 MG tablet    Sig: Take 1 tablet (25 mg total) by mouth daily.    Dispense:  90 tablet    Refill:  1    Follow-up: Return in about 6 months (around 12/20/2019) for medical conditions.       Hoy RegisterEnobong Aira Sallade, MD, FAAFP. [facility] [place], [place] [phone]   06/22/2019, 10:00 AM

## 2019-06-23 LAB — CYTOLOGY - PAP
Diagnosis: NEGATIVE
HPV: NOT DETECTED

## 2019-06-24 LAB — CERVICOVAGINAL ANCILLARY ONLY
Bacterial vaginitis: NEGATIVE
Candida vaginitis: NEGATIVE
Chlamydia: NEGATIVE
Neisseria Gonorrhea: NEGATIVE
Trichomonas: NEGATIVE

## 2019-07-15 ENCOUNTER — Other Ambulatory Visit: Payer: Self-pay | Admitting: Family Medicine

## 2019-07-17 MED FILL — POTASSIUM CL ER 20 MEQ TAB: 20 | 30 days supply | Qty: 30 | Fill #0

## 2019-07-19 ENCOUNTER — Encounter: Payer: Self-pay | Admitting: Family Medicine

## 2019-07-20 NOTE — Telephone Encounter (Signed)
Patient would benefit from an ENT referral due to ongoing throat problems.

## 2019-07-22 MED FILL — ?CHLORTHALIDONE 25 MG TABLE: 25 | 30 days supply | Qty: 30 | Fill #1

## 2019-07-22 MED FILL — ?AMLODIPINE BESYLATE 10 MG: 10 | 30 days supply | Qty: 30 | Fill #1

## 2019-08-16 ENCOUNTER — Encounter (HOSPITAL_COMMUNITY): Payer: Self-pay

## 2019-08-16 ENCOUNTER — Other Ambulatory Visit: Payer: Self-pay

## 2019-08-16 ENCOUNTER — Ambulatory Visit (HOSPITAL_COMMUNITY)
Admission: EM | Admit: 2019-08-16 | Discharge: 2019-08-16 | Disposition: A | Discharge status: Home / Self Care | Payer: BC Managed Care – PPO | Attending: Family Medicine | Admitting: Family Medicine

## 2019-08-16 ENCOUNTER — Ambulatory Visit (INDEPENDENT_AMBULATORY_CARE_PROVIDER_SITE_OTHER): Payer: BC Managed Care – PPO

## 2019-08-16 DIAGNOSIS — S61452A Open bite of left hand, initial encounter: Secondary | ICD-10-CM

## 2019-08-16 DIAGNOSIS — W503XXA Accidental bite by another person, initial encounter: Secondary | ICD-10-CM

## 2019-08-16 DIAGNOSIS — Z23 Encounter for immunization: Secondary | ICD-10-CM | POA: Diagnosis not present

## 2019-08-16 DIAGNOSIS — S61255A Open bite of left ring finger without damage to nail, initial encounter: Secondary | ICD-10-CM

## 2019-08-16 MED ORDER — AMOXICILLIN-POT CLAVULANATE 875-125 MG PO TABS: 1.0000 | ORAL_TABLET | Freq: Two times a day (BID) | ORAL | 0 refills | Status: DC

## 2019-08-16 MED ORDER — TETANUS-DIPHTH-ACELL PERTUSSIS 5-2.5-18.5 LF-MCG/0.5 IM SUSP
0.5000 mL | Freq: Once | INTRAMUSCULAR | Status: AC
  Administered 2019-08-16: 0.5 mL via INTRAMUSCULAR

## 2019-08-16 MED ORDER — TETANUS-DIPHTH-ACELL PERTUSSIS 5-2.5-18.5 LF-MCG/0.5 IM SUSP
INTRAMUSCULAR | Status: AC
  Filled 2019-08-16: qty 0.5
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
## 2019-08-17 MED FILL — POTASSIUM CL ER 20 MEQ TAB: 20 | 30 days supply | Qty: 30 | Fill #1

## 2019-08-17 MED FILL — AMOX-CLAV 875-125 MG TABLET: 875-125 | 5 days supply | Qty: 10 | Fill #0

## 2019-08-24 MED FILL — ?CHLORTHALIDONE 25 MG TABLE: 25 | 30 days supply | Qty: 30 | Fill #2

## 2019-08-24 MED FILL — ?AMLODIPINE BESYLATE 10 MG: 10 | 30 days supply | Qty: 30 | Fill #2

## 2019-09-08 ENCOUNTER — Encounter: Payer: Self-pay | Admitting: Family Medicine

## 2019-09-21 ENCOUNTER — Encounter: Payer: Self-pay | Admitting: Family Medicine

## 2019-09-21 ENCOUNTER — Other Ambulatory Visit: Payer: Self-pay

## 2019-09-21 ENCOUNTER — Ambulatory Visit: Payer: BC Managed Care – PPO | Attending: Family Medicine | Admitting: Family Medicine

## 2019-09-21 DIAGNOSIS — I10 Essential (primary) hypertension: Secondary | ICD-10-CM

## 2019-09-21 DIAGNOSIS — K224 Dyskinesia of esophagus: Secondary | ICD-10-CM | POA: Diagnosis not present

## 2019-09-21 DIAGNOSIS — E876 Hypokalemia: Secondary | ICD-10-CM

## 2019-09-21 MED ORDER — AMLODIPINE BESYLATE 10 MG PO TABS: 10.0000 mg | ORAL_TABLET | ORAL | 1 refills | Status: DC

## 2019-09-21 MED ORDER — CHLORTHALIDONE 25 MG PO TABS: 25.0000 mg | ORAL_TABLET | Freq: Every day | ORAL | 1 refills | Status: DC

## 2019-09-21 MED ORDER — POTASSIUM CHLORIDE CRYS ER 20 MEQ PO TBCR: 20.0000 meq | EXTENDED_RELEASE_TABLET | Freq: Every day | ORAL | 1 refills | Status: DC

## 2019-09-21 MED FILL — CHLORTHALIDONE 25 MG TAB: 25 | 90 days supply | Qty: 90 | Fill #0

## 2019-09-21 MED FILL — POTASSIUM CL ER 20 MEQ TAB: 20 | 90 days supply | Qty: 90 | Fill #0

## 2019-09-21 MED FILL — AMLODIPINE BESYLATE 10 MG T: 10 | 90 days supply | Qty: 90 | Fill #0

## 2019-09-21 NOTE — Progress Notes (Signed)
Virtual Visit via Telephone Note  I connected with Kelsey Joyce, on 09/21/2019 at 9:25 AM by telephone due to the COVID-19 pandemic and verified that I am speaking with the correct person using two identifiers.   Consent: I discussed the limitations, risks, security and privacy concerns of performing an evaluation and management service by telephone and the availability of in person appointments. I also discussed with the patient that there may be a patient responsible charge related to this service. The patient expressed understanding and agreed to proceed.   Location of Patient: Home  Location of Provider: Clinic   Persons participating in Telemedicine visit: Lucilla Antwanette Wesche Farrington-CMA Dr. Alvis Lemmings     History of Present Illness: 33 year old female with a history of Hypertension seen today with the following concerns.  She complains of feeling like something is holding her esophagus. Has been doing home remedies including camomile tea and sometimes feels like she is about to have a panic attack because of the feeling in her throat; she has also had associated fluttering of her heart. She almost feels like she is about to choke, like a piece is stuck in her throat. She sometimes has acid reflux symptoms and uses tums sometimes with relief.  Ultrasound of the thyroid from 05/2019 revealed: IMPRESSION: Mildly enlarged thyroid gland. 0.9 cm spongiform nodule in the right mid lobe does not meet criteria for biopsy or dedicated follow-up.  Past Medical History:  Diagnosis Date  . Anxiety   . Hypertension    Allergies  Allergen Reactions  . Acetaminophen Nausea Only  . Asa [Aspirin] Nausea Only    Current Outpatient Medications on File Prior to Visit  Medication Sig Dispense Refill  . amLODipine (NORVASC) 10 MG tablet Take 1 tablet (10 mg total) by mouth every morning. 90 tablet 1  . chlorthalidone (HYGROTON) 25 MG tablet Take 1 tablet (25 mg total) by mouth daily.  90 tablet 1  . ibuprofen (ADVIL,MOTRIN) 200 MG tablet Take 400 mg by mouth daily as needed for moderate pain.    . potassium chloride SA (KLOR-CON) 20 MEQ tablet TAKE 1 TABLET BY MOUTH ONCE DAILY 30 tablet 2  . amoxicillin-clavulanate (AUGMENTIN) 875-125 MG tablet Take 1 tablet by mouth 2 (two) times daily. (Patient not taking: Reported on 09/21/2019) 10 tablet 0  . cetirizine (ZYRTEC) 10 MG tablet Take 1 tablet (10 mg total) by mouth daily. (Patient not taking: Reported on 06/03/2019) 30 tablet 6  . fluticasone (FLONASE) 50 MCG/ACT nasal spray Place 2 sprays into both nostrils daily. (Patient not taking: Reported on 03/19/2019) 16 g 6  . ibuprofen (ADVIL,MOTRIN) 600 MG tablet Take 1 tablet (600 mg total) by mouth every 12 (twelve) hours as needed for moderate pain. (Patient not taking: Reported on 03/19/2019) 30 tablet 0   No current facility-administered medications on file prior to visit.    Observations/Objective: Awake, alert, oriented x3 Not in acute distress  Assessment and Plan: 1. Essential hypertension Stable Counseled on blood pressure goal of less than 130/80, low-sodium, DASH diet, medication compliance, 150 minutes of moderate intensity exercise per week. Discussed medication compliance, adverse effects. - amLODipine (NORVASC) 10 MG tablet; Take 1 tablet (10 mg total) by mouth every morning.  Dispense: 90 tablet; Refill: 1 - chlorthalidone (HYGROTON) 25 MG tablet; Take 1 tablet (25 mg total) by mouth daily.  Dispense: 90 tablet; Refill: 1  2. Esophageal spasm Ultrasound of the neck does not explain her symptoms She may benefit from a upper endoscopy -  Ambulatory referral to Gastroenterology  3. Hypokalemia Secondary to diuretic use - potassium chloride SA (KLOR-CON) 20 MEQ tablet; Take 1 tablet (20 mEq total) by mouth daily.  Dispense: 90 tablet; Refill: 1   Follow Up Instructions: Return for Medical conditions, keep previously scheduled appointment..    I discussed  the assessment and treatment plan with the patient. The patient was provided an opportunity to ask questions and all were answered. The patient agreed with the plan and demonstrated an understanding of the instructions.   The patient was advised to call back or seek an in-person evaluation if the symptoms worsen or if the condition fails to improve as anticipated.     I provided 15 minutes total of non-face-to-face time during this encounter including median intraservice time, reviewing previous notes, labs, imaging, medications, management and patient verbalized understanding.     Charlott Rakes, MD, FAAFP. [facility] [place], [place]   09/21/2019, 9:25 AM

## 2019-09-21 NOTE — Progress Notes (Signed)
Patient has been called and DOB has been verified. Patient has been screened and transferred to PCP to start phone visit.     

## 2019-10-16 ENCOUNTER — Other Ambulatory Visit: Payer: Self-pay | Admitting: Gastroenterology

## 2019-10-16 DIAGNOSIS — R1310 Dysphagia, unspecified: Secondary | ICD-10-CM

## 2019-10-23 ENCOUNTER — Other Ambulatory Visit: Payer: Self-pay | Admitting: Gastroenterology

## 2019-10-23 ENCOUNTER — Ambulatory Visit
Admission: RE | Admit: 2019-10-23 | Discharge: 2019-10-23 | Disposition: A | Discharge status: Home / Self Care | Payer: BC Managed Care – PPO | Source: Ambulatory Visit | Attending: Gastroenterology | Admitting: Gastroenterology

## 2019-10-23 DIAGNOSIS — R1310 Dysphagia, unspecified: Secondary | ICD-10-CM

## 2019-11-17 MED FILL — AMOXICILLIN 875 MG TABS: 875 | 5 days supply | Qty: 10 | Fill #0

## 2019-11-17 MED FILL — IBUPROFEN 800 MG TABLET: 800 | 7 days supply | Qty: 30 | Fill #0

## 2019-12-28 MED FILL — CHLORTHALIDONE 25 MG TAB: 25 | 90 days supply | Qty: 90 | Fill #1

## 2019-12-28 MED FILL — POTASSIUM CL ER 20 MEQ TABL: 20 | 90 days supply | Qty: 90 | Fill #1

## 2019-12-28 MED FILL — AMLODIPINE BESYLATE 10 MG T: 10 | 90 days supply | Qty: 90 | Fill #3

## 2020-04-04 ENCOUNTER — Other Ambulatory Visit: Payer: Self-pay | Admitting: Family Medicine

## 2020-04-04 DIAGNOSIS — I10 Essential (primary) hypertension: Secondary | ICD-10-CM

## 2020-04-04 MED FILL — CHLORTHALIDONE 25 MG TAB: 25 | 30 days supply | Qty: 30 | Fill #3

## 2020-04-04 MED FILL — AMLODIPINE BESYLATE 10 MG T: 10 | 90 days supply | Qty: 90 | Fill #1

## 2020-04-04 MED FILL — POTASSIUM CL ER 20 MEQ TABL: 20 | 30 days supply | Qty: 30 | Fill #2

## 2020-04-16 IMAGING — US US THYROID
1 series · 13 of 25 positions shown · non-contrast
Comparison: None.

CLINICAL DATA: Palpable abnormality. Thyromegaly, neck swelling and
obstructive symptoms.

EXAM:
THYROID ULTRASOUND
TECHNIQUE: Ultrasound examination of the thyroid gland and adjacent soft
tissues was performed.

[Series 1: us thyroid · 0.07mm/px · 13 of 43 slices shown]
[im 1/43]
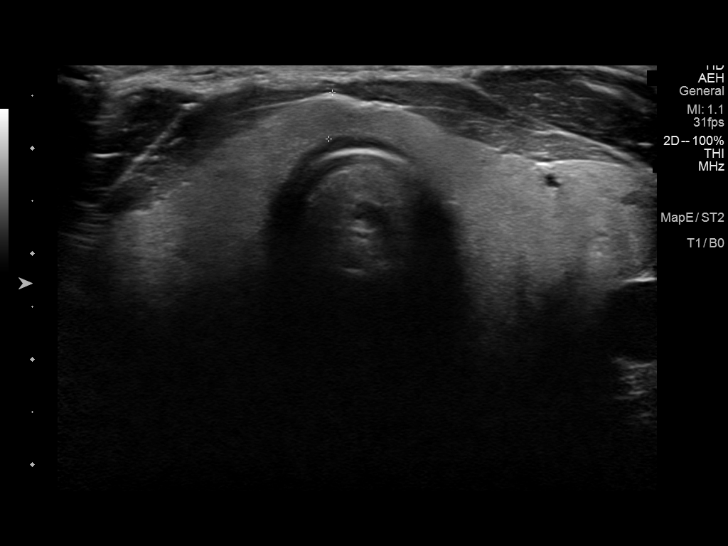
[im 4/43]
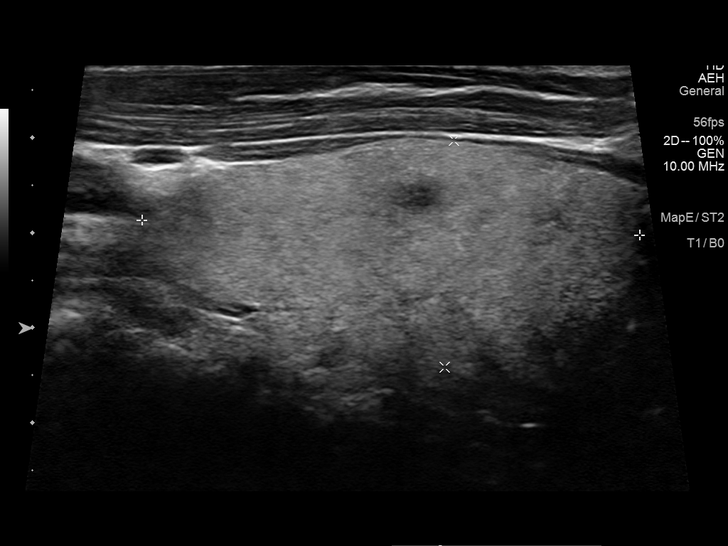
[im 8/43]
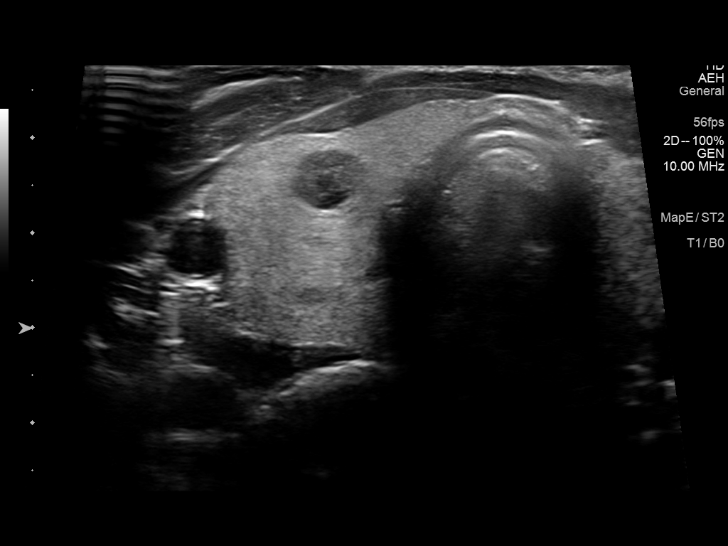
[im 11/43]
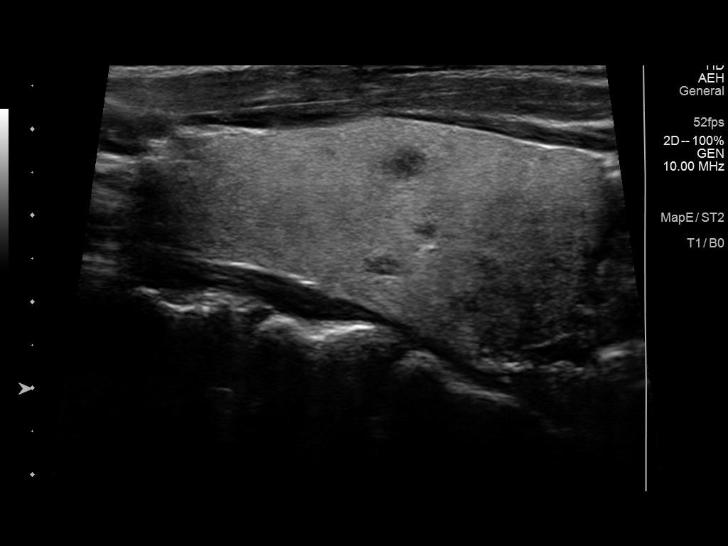
[im 15/43]
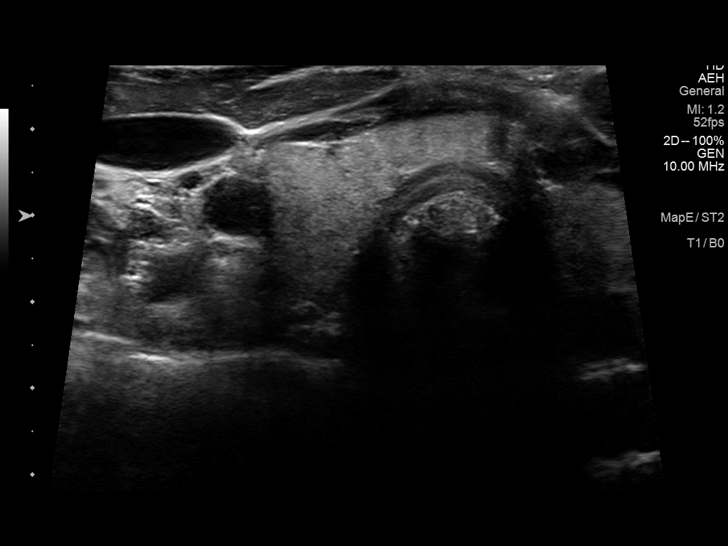
[im 18/43]
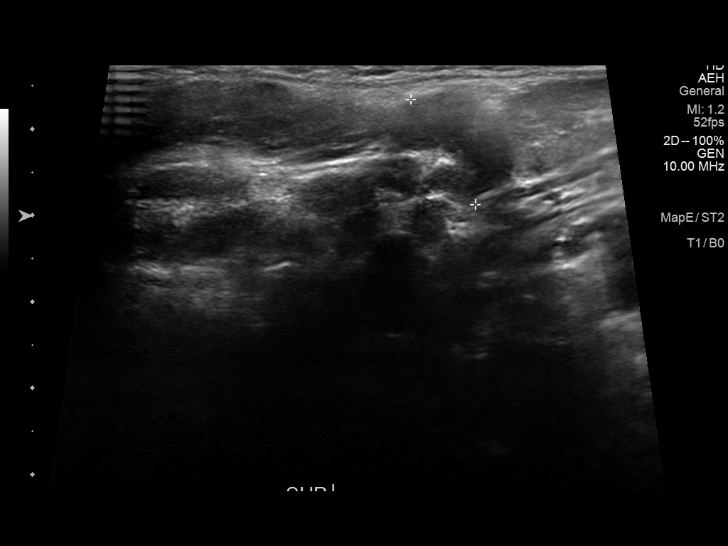
[im 22/43]
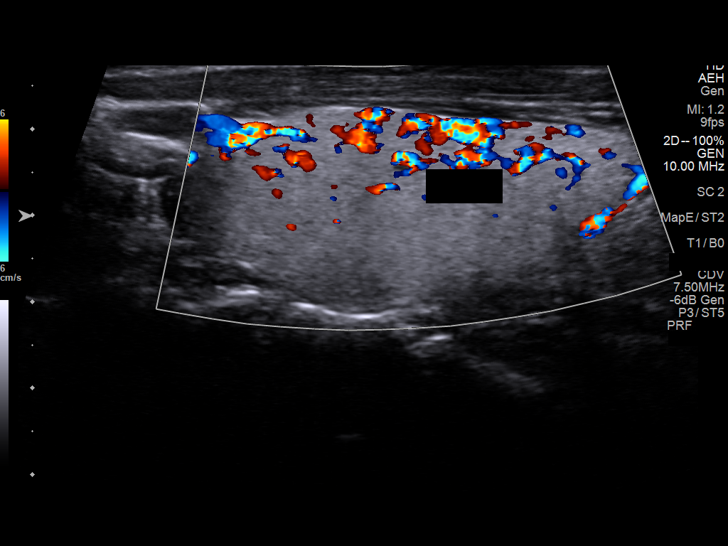
[im 25/43]
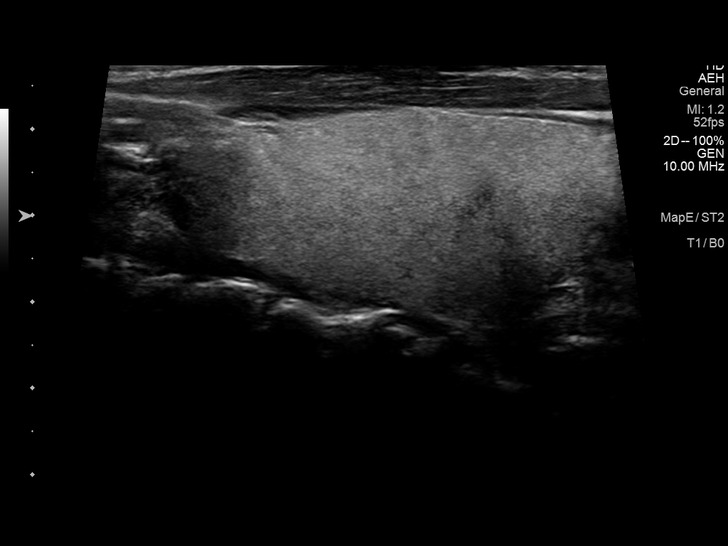
[im 29/43]
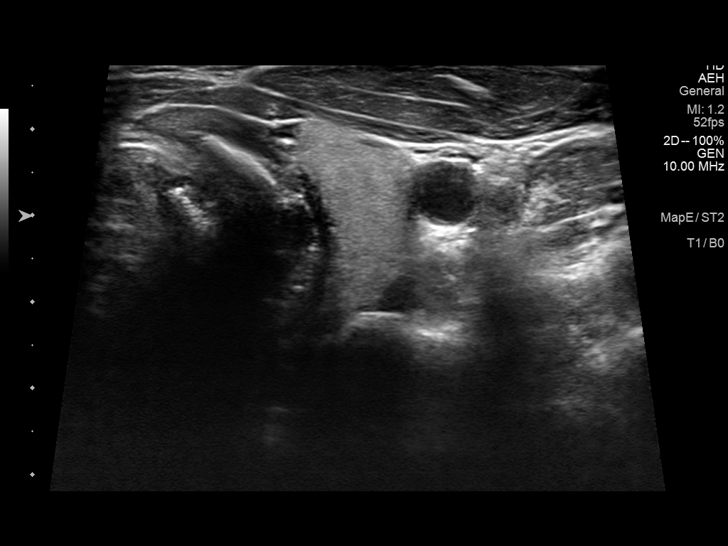
[im 32/43]
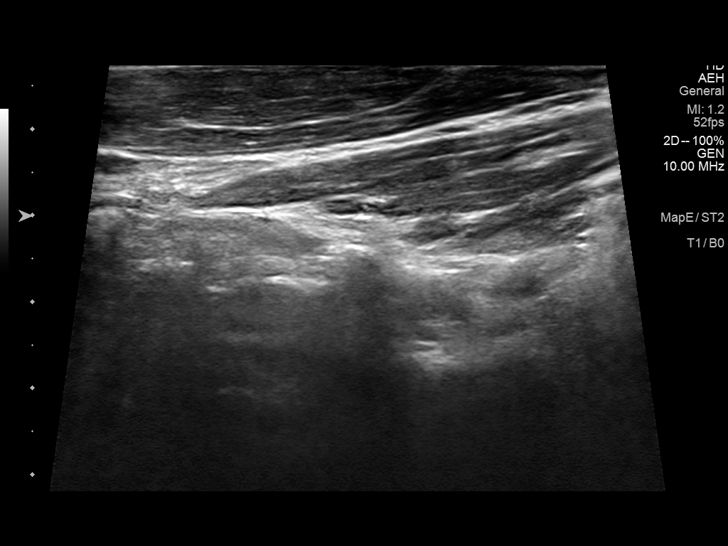
[im 36/43]
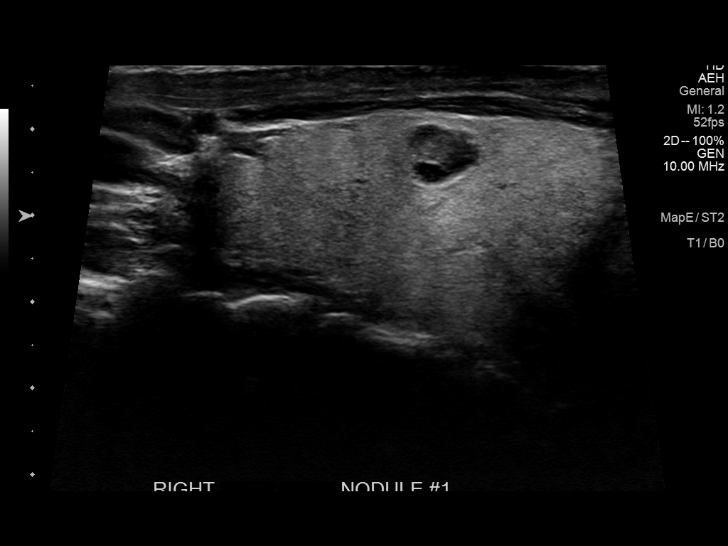
[im 39/43]
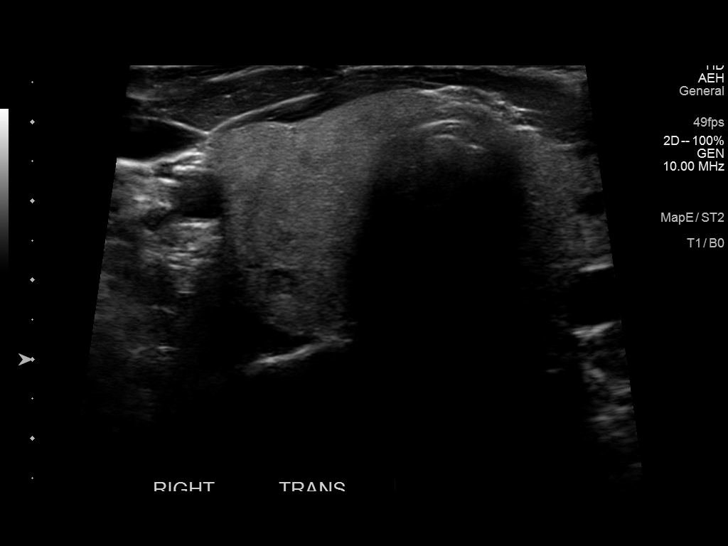
[im 43/43]
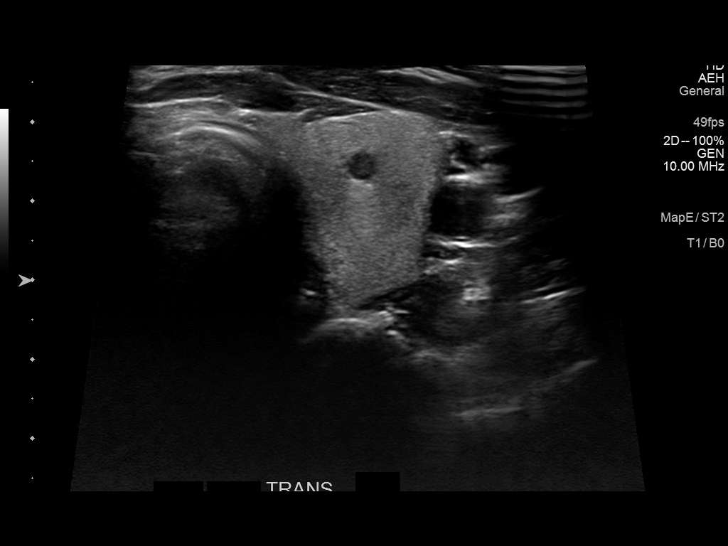

[13 of 25 positions shown; findings below may reference images not displayed]

FINDINGS: Parenchymal Echotexture: Normal

Isthmus: 0.4 cm

Right lobe: 5.2 x 2.4 x 2.0 cm

Left lobe: 5.5 x 2.5 x 2.1 cm

_________________________________________________________

Estimated total number of nodules >/= 1 cm: 0

Number of spongiform nodules >/=  2 cm not described below (TR1): 0

Number of mixed cystic and solid nodules >/= 1.5 cm not described
below (TR2): 0

_________________________________________________________

Nodule # 1:

Location: Right; Mid

Maximum size: 0.9 cm; Other 2 dimensions: 0.8 x 0.6 cm

Composition: spongiform (0)

Echogenicity: anechoic (0)

Shape: not taller-than-wide (0)

Margins: smooth (0)

Echogenic foci: none (0)

ACR TI-RADS total points: 0.

ACR TI-RADS risk category: TR1 (0-1 points).

ACR TI-RADS recommendations:

This nodule does NOT meet TI-RADS criteria for biopsy or dedicated
follow-up.

_________________________________________________________

No abnormal lymph nodes identified.
IMPRESSION: Mildly enlarged thyroid gland. 0.9 cm spongiform nodule in the right
mid lobe does not meet criteria for biopsy or dedicated follow-up.

The above is in keeping with the ACR TI-RADS recommendations - [HOSPITAL] 1867;[DATE].

## 2020-07-07 MED FILL — AMLODIPINE BESYLATE 10 MG T: 10 | 30 days supply | Qty: 30 | Fill #0

## 2020-08-15 ENCOUNTER — Other Ambulatory Visit: Payer: Self-pay | Admitting: Family Medicine

## 2020-08-15 DIAGNOSIS — I10 Essential (primary) hypertension: Secondary | ICD-10-CM

## 2020-08-15 MED FILL — AMLODIPINE BESYLATE 10 MG T: 10 | 30 days supply | Qty: 30 | Fill #0

## 2020-08-15 NOTE — Telephone Encounter (Signed)
Refill request for Amlodipine last refilled 07/07/20; no valid encounter within last 6 months; no upcoming visit noted; pt notified; decision tree completed; pt offered and accepted appt with [facility],08/25/20 at 1610; she verbalized understanding; 30 day courtesy refill granted; visit needed for additional refills; will route to office for notification of encounter. Requested Prescriptions  Pending Prescriptions Disp Refills  . amLODipine (NORVASC) 10 MG tablet [Pharmacy Med Name: AMLODIPINE BESYLATE 10 MG T 10 Tablet] 30 tablet 0    Sig: TAKE 1 TABLET (10 MG TOTAL) BY MOUTH EVERY MORNING.     Cardiovascular:  Calcium Channel Blockers Failed - 08/15/2020  8:51 AM      Failed - Valid encounter within last 6 months    Recent Outpatient Visits          10 months ago Esophageal spasm   [facility] Hoy Register, MD   1 year ago Screening for cervical cancer   [facility] Hoy Register, MD   1 year ago Neck swelling   [facility] Hoy Register, MD   1 year ago Essential hypertension   [facility] [place], [place], [place]   2 years ago Screening for cervical cancer   [facility] Hoy Register, MD      Future Appointments            In 1 week Anders Simmonds, PA-C [facility]           Passed - Last BP in normal range    BP Readings from Last 1 Encounters:  08/16/19 120/86

## 2020-08-25 ENCOUNTER — Ambulatory Visit: Payer: Self-pay | Attending: Physician Assistant | Admitting: Physician Assistant

## 2020-08-25 ENCOUNTER — Other Ambulatory Visit: Payer: Self-pay

## 2020-08-25 ENCOUNTER — Other Ambulatory Visit: Payer: Self-pay | Admitting: Physician Assistant

## 2020-08-25 ENCOUNTER — Ambulatory Visit: Payer: BC Managed Care – PPO | Admitting: Physician Assistant

## 2020-08-25 DIAGNOSIS — Z131 Encounter for screening for diabetes mellitus: Secondary | ICD-10-CM

## 2020-08-25 DIAGNOSIS — I10 Essential (primary) hypertension: Secondary | ICD-10-CM

## 2020-08-25 MED ORDER — AMLODIPINE BESYLATE 10 MG PO TABS: 10.0000 mg | ORAL_TABLET | ORAL | 1 refills | Status: DC

## 2020-08-25 MED ORDER — CHLORTHALIDONE 25 MG PO TABS: 25.0000 mg | ORAL_TABLET | Freq: Every day | ORAL | 1 refills | Status: DC

## 2020-08-25 MED FILL — CHLORTHALIDONE 25 MG TAB: 25 | 30 days supply | Qty: 30 | Fill #0

## 2020-08-25 NOTE — Progress Notes (Signed)
BP normal  Only off meds 2 days

## 2020-08-25 NOTE — Progress Notes (Signed)
Virtual Visit via Telephone Note  I connected with Kelsey Joyce on 08/25/20 at  8:30 AM EST by telephone and verified that I am speaking with the correct person using two identifiers.  Location: Patient: Kelsey Joyce Provider: Georgian Co, PA-C   I discussed the limitations, risks, security and privacy concerns of performing an evaluation and management service by telephone and the availability of in person appointments. I also discussed with the patient that there may be a patient responsible charge related to this service. The patient expressed understanding and agreed to proceed.  PATIENT visit by telephone virtually in the context of Covid-19 pandemic. Patient location:  home My Location:  [facility] office Persons on the call: me and the patient.  Roomed by Margorie John   History of Present Illness: BP OOO running 120/80-90.  No HA/CP/SOB.  She is willing to come and get labs today or tomorrow.  No issues or concerns today.  Compliant with chlorthalidone and amlodipine.      Observations/Objective:  NAd.  A&Ox3   Assessment and Plan: 1. Essential hypertension We have discussed target BP range and blood pressure goal. I have advised patient to check BP regularly and to call us back or report to clinic if the numbers are consistently higher than 140/90. We discussed the importance of compliance with medical therapy and DASH diet recommended, consequences of uncontrolled hypertension discussed.  - amLODipine (NORVASC) 10 MG tablet; Take 1 tablet (10 mg total) by mouth every morning.  Dispense: 90 tablet; Refill: 1 - chlorthalidone (HYGROTON) 25 MG tablet; Take 1 tablet (25 mg total) by mouth daily.  Dispense: 90 tablet; Refill: 1 - Comprehensive metabolic panel; Future - Hemoglobin A1c; Future  2. Screening for diabetes mellitus I have had a lengthy discussion and provided education about insulin resistance and the intake of too much sugar/refined carbohydrates.  I have  advised the patient to work at a goal of eliminating sugary drinks, candy, desserts, sweets, refined sugars, processed foods, and white carbohydrates.  The patient expresses understanding.   - Hemoglobin A1c; Future    Follow Up Instructions: See PCP in 3 months   I discussed the assessment and treatment plan with the patient. The patient was provided an opportunity to ask questions and all were answered. The patient agreed with the plan and demonstrated an understanding of the instructions.   The patient was advised to call back or seek an in-person evaluation if the symptoms worsen or if the condition fails to improve as anticipated.  I provided 9 minutes of non-face-to-face time during this encounter.   Georgian Co, PA-C  Patient ID: Kelsey Joyce, female   DOB: 09-18-1986, 34 y.o.   MRN: [id number]

## 2020-08-25 NOTE — Addendum Note (Signed)
Addended byMemory Dance on: 08/25/2020 10:26 AM   Modules accepted: Orders

## 2020-08-26 LAB — HEMOGLOBIN A1C
Est. average glucose Bld gHb Est-mCnc: 114 mg/dL
Hgb A1c MFr Bld: 5.6 % (ref 4.8–5.6)

## 2020-08-26 LAB — COMPREHENSIVE METABOLIC PANEL
ALT: 9 IU/L (ref 0–32)
AST: 14 IU/L (ref 0–40)
Albumin/Globulin Ratio: 1.7 (ref 1.2–2.2)
Albumin: 4 g/dL (ref 3.8–4.8)
Alkaline Phosphatase: 69 IU/L (ref 44–121)
BUN/Creatinine Ratio: 9 (ref 9–23)
BUN: 7 mg/dL (ref 6–20)
Bilirubin Total: <0.2 mg/dL (ref 0.0–1.2)
CO2: 21 mmol/L (ref 20–29)
Calcium: 8.6 mg/dL — ABNORMAL LOW (ref 8.7–10.2)
Chloride: 108 mmol/L — ABNORMAL HIGH (ref 96–106)
Creatinine, Ser: 0.8 mg/dL (ref 0.57–1.00)
GFR calc Af Amer: 111 mL/min/{1.73_m2} (ref >59)
GFR calc non Af Amer: 96 mL/min/{1.73_m2} (ref >59)
Globulin, Total: 2.4 g/dL (ref 1.5–4.5)
Glucose: 73 mg/dL (ref 65–99)
Potassium: 4.2 mmol/L (ref 3.5–5.2)
Sodium: 142 mmol/L (ref 134–144)
Total Protein: 6.4 g/dL (ref 6.0–8.5)

## 2020-09-05 ENCOUNTER — Encounter: Payer: Self-pay | Admitting: Family Medicine

## 2020-09-20 MED FILL — AMLODIPINE BESYLATE 10 MG T: 10 | 90 days supply | Qty: 90 | Fill #0

## 2020-09-26 ENCOUNTER — Encounter: Payer: Self-pay | Admitting: Family Medicine

## 2020-10-17 ENCOUNTER — Ambulatory Visit: Payer: Medicaid Other | Admitting: Family Medicine

## 2020-11-10 ENCOUNTER — Ambulatory Visit: Payer: Medicaid Other | Admitting: Family Medicine

## 2020-12-08 ENCOUNTER — Other Ambulatory Visit: Payer: Self-pay | Admitting: Pharmacy Technician

## 2020-12-08 ENCOUNTER — Other Ambulatory Visit: Payer: Self-pay | Admitting: Family Medicine

## 2020-12-08 ENCOUNTER — Other Ambulatory Visit (HOSPITAL_COMMUNITY)
Admission: RE | Admit: 2020-12-08 | Discharge: 2020-12-08 | Disposition: A | Discharge status: Home / Self Care | Payer: Medicaid Other | Source: Ambulatory Visit | Attending: Family Medicine | Admitting: Family Medicine

## 2020-12-08 ENCOUNTER — Encounter: Payer: Self-pay | Admitting: Family Medicine

## 2020-12-08 ENCOUNTER — Other Ambulatory Visit: Payer: Self-pay

## 2020-12-08 ENCOUNTER — Ambulatory Visit: Payer: Self-pay | Attending: Family Medicine | Admitting: Family Medicine

## 2020-12-08 VITALS — BP 120/73 | HR 85 | Ht 67.0 in | Wt 129.0 lb

## 2020-12-08 DIAGNOSIS — Z2821 Immunization not carried out because of patient refusal: Secondary | ICD-10-CM

## 2020-12-08 DIAGNOSIS — Z01419 Encounter for gynecological examination (general) (routine) without abnormal findings: Secondary | ICD-10-CM | POA: Diagnosis not present

## 2020-12-08 DIAGNOSIS — Z113 Encounter for screening for infections with a predominantly sexual mode of transmission: Secondary | ICD-10-CM | POA: Diagnosis present

## 2020-12-08 DIAGNOSIS — Z0000 Encounter for general adult medical examination without abnormal findings: Secondary | ICD-10-CM

## 2020-12-08 DIAGNOSIS — N760 Acute vaginitis: Secondary | ICD-10-CM | POA: Diagnosis not present

## 2020-12-08 DIAGNOSIS — Z124 Encounter for screening for malignant neoplasm of cervix: Secondary | ICD-10-CM | POA: Diagnosis present

## 2020-12-08 DIAGNOSIS — I10 Essential (primary) hypertension: Secondary | ICD-10-CM | POA: Insufficient documentation

## 2020-12-08 DIAGNOSIS — N898 Other specified noninflammatory disorders of vagina: Secondary | ICD-10-CM | POA: Insufficient documentation

## 2020-12-08 DIAGNOSIS — Z1159 Encounter for screening for other viral diseases: Secondary | ICD-10-CM

## 2020-12-08 DIAGNOSIS — B9689 Other specified bacterial agents as the cause of diseases classified elsewhere: Secondary | ICD-10-CM | POA: Insufficient documentation

## 2020-12-08 MED ORDER — AMLODIPINE BESYLATE 10 MG PO TABS: 10.0000 mg | ORAL_TABLET | ORAL | 1 refills | Status: DC

## 2020-12-08 NOTE — Progress Notes (Signed)
Subjective:  Patient ID: Kelsey Joyce, female    DOB: 11/09/1985  Age: 35 y.o. MRN: [id number]  CC: Gynecologic Exam   HPI Kelsey Joyce is a 35 year old female with a history of hypertension who presents today for a gynecological exam.  Last Pap smear was in 06/2019- NILM, HPV-ve.  She has a history of LSIL, HPV+ in 01/2017, LSIL HPV-ve in 06/2018 s/p LEEP in 06/2017.  She was noticing whitish thick infection, minimal vaginal itching and is unrelated to her cycles. Sometimes comes before or after her cycles.  She has not paid attention to the pattern and is unsure if this occurs every month.  With regards to her hypertension she is compliant with amlodipine and hydrochlorothiazide and denies adverse effects of her medication.  She has no chest pain or dyspnea. Past Medical History:  Diagnosis Date  . Anxiety   . Hypertension     Past Surgical History:  Procedure Laterality Date  . COLPOSCOPY    . LEEP    . ROBOTIC ASSISTED LAPAROSCOPIC CHOLECYSTECTOMY-MULTI SITE N/A 09/09/2018   Procedure: ROBOTIC ASSISTED LAPAROSCOPIC CHOLECYSTECTOMY-MULTI SITE;  Surgeon: Jules Husbands, MD;  Location: [facility] ORS;  Service: General;  Laterality: N/A;  . UMBILICAL HERNIA REPAIR N/A 09/09/2018   Procedure: HERNIA REPAIR UMBILICAL ADULT;  Surgeon: Jules Husbands, MD;  Location: [facility] ORS;  Service: General;  Laterality: N/A;    Family History  Problem Relation Age of Onset  . Hypertension Mother   . Cervical cancer Other 39       Grandmother  . Hypertension Paternal Aunt   . Breast cancer Other 49       Aunt  . Breast cancer Other        Great Aunt   . Cancer Maternal Grandmother        cerival    Allergies  Allergen Reactions  . Acetaminophen Nausea Only  . Asa [Aspirin] Nausea Only    Outpatient Medications Prior to Visit  Medication Sig Dispense Refill  . amLODipine (NORVASC) 10 MG tablet Take 1 tablet (10 mg total) by mouth every morning. 90 tablet 1  . chlorthalidone (HYGROTON) 25  MG tablet Take 1 tablet (25 mg total) by mouth daily. 90 tablet 1  . potassium chloride SA (KLOR-CON) 20 MEQ tablet Take 1 tablet (20 mEq total) by mouth daily. 90 tablet 1  . fluticasone (FLONASE) 50 MCG/ACT nasal spray Place 2 sprays into both nostrils daily. (Patient not taking: No sig reported) 16 g 6  . ibuprofen (ADVIL,MOTRIN) 200 MG tablet Take 400 mg by mouth daily as needed for moderate pain.    Marland Kitchen ibuprofen (ADVIL,MOTRIN) 600 MG tablet Take 1 tablet (600 mg total) by mouth every 12 (twelve) hours as needed for moderate pain. (Patient not taking: No sig reported) 30 tablet 0   No facility-administered medications prior to visit.     ROS Review of Systems  Constitutional: Negative for activity change, appetite change and fatigue.  HENT: Negative for congestion, sinus pressure and sore throat.   Eyes: Negative for visual disturbance.  Respiratory: Negative for cough, chest tightness, shortness of breath and wheezing.   Cardiovascular: Negative for chest pain and palpitations.  Gastrointestinal: Negative for abdominal distention, abdominal pain and constipation.  Endocrine: Negative for polydipsia.  Genitourinary: Positive for vaginal discharge. Negative for dysuria and frequency.  Musculoskeletal: Negative for arthralgias and back pain.  Skin: Negative for rash.  Neurological: Negative for tremors, light-headedness and numbness.  Hematological: Does not bruise/bleed easily.  Psychiatric/Behavioral:  Negative for agitation and behavioral problems.    Objective:  BP 120/73   Pulse 85   Ht _0  (1.702 m)   Wt 129 lb (58.5 kg)   SpO2 100%   BMI 20.20 kg/m   BP/Weight 12/08/2020 08/16/2019 02/22/16  Systolic BP 494 496 759  Diastolic BP 73 86 83  Wt. (Lbs) 129 - 125.6  BMI 20.2 - 19.67      Physical Exam Constitutional:      General: She is not in acute distress.    Appearance: She is well-developed and well-nourished. She is not diaphoretic.  HENT:     Head:  Normocephalic.     Right Ear: External ear normal.     Left Ear: External ear normal.     Nose: Nose normal.     Mouth/Throat:     Mouth: Oropharynx is clear and moist.  Eyes:     Extraocular Movements: EOM normal.     Conjunctiva/sclera: Conjunctivae normal.     Pupils: Pupils are equal, round, and reactive to light.  Neck:     Vascular: No JVD.  Cardiovascular:     Rate and Rhythm: Normal rate and regular rhythm.     Pulses: Intact distal pulses.     Heart sounds: Normal heart sounds. No murmur heard. No gallop.   Pulmonary:     Effort: Pulmonary effort is normal. No respiratory distress.     Breath sounds: Normal breath sounds. No wheezing or rales.  Chest:     Chest wall: No tenderness.  Abdominal:     General: Bowel sounds are normal. There is no distension.     Palpations: Abdomen is soft. There is no mass.     Tenderness: There is no abdominal tenderness.  Genitourinary:    Comments: Normal external genitalia Vagina with whitish discharge Normal cervix, normal adnexa Musculoskeletal:        General: No tenderness or edema. Normal range of motion.     Cervical back: Normal range of motion.  Skin:    General: Skin is warm and dry.  Neurological:     Mental Status: She is alert and oriented to person, place, and time.     Deep Tendon Reflexes: Reflexes are normal and symmetric.  Psychiatric:        Mood and Affect: Mood and affect normal.     CMP Latest Ref Rng & Units 08/25/2020 05/18/2019 09/03/2018  Glucose 65 - 99 mg/dL 73 104(H) -  BUN 6 - 20 mg/dL 7 6 -  Creatinine 0.57 - 1.00 mg/dL 0.80 0.77 -  Sodium 134 - 144 mmol/L 142 138 -  Potassium 3.5 - 5.2 mmol/L 4.2 3.0(L) 3.6  Chloride 96 - 106 mmol/L 108(H) 107 -  CO2 20 - 29 mmol/L 21 23 -  Calcium 8.7 - 10.2 mg/dL 8.6(L) 9.0 -  Total Protein 6.0 - 8.5 g/dL 6.4 7.9 -  Total Bilirubin 0.0 - 1.2 mg/dL <0.2 0.7 -  Alkaline Phos 44 - 121 IU/L 69 65 -  AST 0 - 40 IU/L 14 17 -  ALT 0 - 32 IU/L 9 15 -     Lipid Panel     Component Value Date/Time   CHOL 117 02/11/2018 1104   TRIG 42 02/11/2018 1104   HDL 52 02/11/2018 1104   CHOLHDL 2.3 02/11/2018 1104   CHOLHDL 3.2 Ratio 10/14/2009 2108   VLDL 9 10/14/2009 2108   LDLCALC 57 02/11/2018 1104    CBC    Component Value  Date/Time   WBC 9.2 05/18/2019 2021   RBC 4.02 05/18/2019 2021   HGB 11.8 (L) 05/18/2019 2021   HCT 35.2 (L) 05/18/2019 2021   PLT 242 05/18/2019 2021   MCV 87.6 05/18/2019 2021   MCH 29.4 05/18/2019 2021   MCHC 33.5 05/18/2019 2021   RDW 15.2 05/18/2019 2021   LYMPHSABS 4.3 (H) 04/08/2018 1521   MONOABS 0.4 04/08/2018 1521   EOSABS 0.0 04/08/2018 1521   BASOSABS 0.0 04/08/2018 1521    Lab Results  Component Value Date   HGBA1C 5.6 08/25/2020    Assessment & Plan:  1. Annual physical exam Counseled on 150 minutes of exercise per week, healthy eating (including decreased daily intake of saturated fats, cholesterol, added sugars, sodium), routine healthcare maintenance.  - CMP14+EGFR - Lipid panel  2. Screening for cervical cancer - Cytology - PAP([place])  3. Screening for STD (sexually transmitted disease) - Cervicovaginal ancillary only  4. Essential hypertension Controlled Counseled on blood pressure goal of less than 130/80, low-sodium, DASH diet, medication compliance, 150 minutes of moderate intensity exercise per week. Discussed medication compliance, adverse effects. - amLODipine (NORVASC) 10 MG tablet; Take 1 tablet (10 mg total) by mouth every morning.  Dispense: 90 tablet; Refill: 1  5. Vaginal discharge We will send of vaginal ancillary test and treat accordingly Advised that if symptoms are recurrent she might have to be placed on prophylactic treatment   6. Influenza vaccination declined Counseled on benefits of flu vaccine but she declines  7. Need for hepatitis C screening test - HCV RNA quant rflx ultra or genotyp([facility]); Future    No orders of the  defined types were placed in this encounter.   Follow-up: No follow-ups on file.       Charlott Rakes, MD, FAAFP. [facility] [place], [place]   12/08/2020, 8:45 AM

## 2020-12-08 NOTE — Patient Instructions (Signed)
Health Maintenance, Female Adopting a healthy lifestyle and getting preventive care are important in promoting health and wellness. Ask your health care provider about:  The right schedule for you to have regular tests and exams.  Things you can do on your own to prevent diseases and keep yourself healthy. What should I know about diet, weight, and exercise? Eat a healthy diet  Eat a diet that includes plenty of vegetables, fruits, low-fat dairy products, and lean protein.  Do not eat a lot of foods that are high in solid fats, added sugars, or sodium.   Maintain a healthy weight Body mass index (BMI) is used to identify weight problems. It estimates body fat based on height and weight. Your health care provider can help determine your BMI and help you achieve or maintain a healthy weight. Get regular exercise Get regular exercise. This is one of the most important things you can do for your health. Most adults should:  Exercise for at least 150 minutes each week. The exercise should increase your heart rate and make you sweat (moderate-intensity exercise).  Do strengthening exercises at least twice a week. This is in addition to the moderate-intensity exercise.  Spend less time sitting. Even light physical activity can be beneficial. Watch cholesterol and blood lipids Have your blood tested for lipids and cholesterol at 35 years of age, then have this test every 5 years. Have your cholesterol levels checked more often if:  Your lipid or cholesterol levels are high.  You are older than 35 years of age.  You are at high risk for heart disease. What should I know about cancer screening? Depending on your health history and family history, you may need to have cancer screening at various ages. This may include screening for:  Breast cancer.  Cervical cancer.  Colorectal cancer.  Skin cancer.  Lung cancer. What should I know about heart disease, diabetes, and high blood  pressure? Blood pressure and heart disease  High blood pressure causes heart disease and increases the risk of stroke. This is more likely to develop in people who have high blood pressure readings, are of African descent, or are overweight.  Have your blood pressure checked: ? Every 3-5 years if you are 18-39 years of age. ? Every year if you are 40 years old or older. Diabetes Have regular diabetes screenings. This checks your fasting blood sugar level. Have the screening done:  Once every three years after age 40 if you are at a normal weight and have a low risk for diabetes.  More often and at a younger age if you are overweight or have a high risk for diabetes. What should I know about preventing infection? Hepatitis B If you have a higher risk for hepatitis B, you should be screened for this virus. Talk with your health care provider to find out if you are at risk for hepatitis B infection. Hepatitis C Testing is recommended for:  Everyone born from 1945 through 1965.  Anyone with known risk factors for hepatitis C. Sexually transmitted infections (STIs)  Get screened for STIs, including gonorrhea and chlamydia, if: ? You are sexually active and are younger than 35 years of age. ? You are older than 35 years of age and your health care provider tells you that you are at risk for this type of infection. ? Your sexual activity has changed since you were last screened, and you are at increased risk for chlamydia or gonorrhea. Ask your health care provider   if you are at risk.  Ask your health care provider about whether you are at high risk for HIV. Your health care provider may recommend a prescription medicine to help prevent HIV infection. If you choose to take medicine to prevent HIV, you should first get tested for HIV. You should then be tested every 3 months for as long as you are taking the medicine. Pregnancy  If you are about to stop having your period (premenopausal) and  you may become pregnant, seek counseling before you get pregnant.  Take 400 to 800 micrograms (mcg) of folic acid every day if you become pregnant.  Ask for birth control (contraception) if you want to prevent pregnancy. Osteoporosis and menopause Osteoporosis is a disease in which the bones lose minerals and strength with aging. This can result in bone fractures. If you are 65 years old or older, or if you are at risk for osteoporosis and fractures, ask your health care provider if you should:  Be screened for bone loss.  Take a calcium or vitamin D supplement to lower your risk of fractures.  Be given hormone replacement therapy (HRT) to treat symptoms of menopause. Follow these instructions at home: Lifestyle  Do not use any products that contain nicotine or tobacco, such as cigarettes, e-cigarettes, and chewing tobacco. If you need help quitting, ask your health care provider.  Do not use street drugs.  Do not share needles.  Ask your health care provider for help if you need support or information about quitting drugs. Alcohol use  Do not drink alcohol if: ? Your health care provider tells you not to drink. ? You are pregnant, may be pregnant, or are planning to become pregnant.  If you drink alcohol: ? Limit how much you use to 0-1 drink a day. ? Limit intake if you are breastfeeding.  Be aware of how much alcohol is in your drink. In the [place] one drink equals one 12 oz bottle of beer (355 mL), one 5 oz glass of wine (148 mL), or one 1 oz glass of hard liquor (44 mL). General instructions  Schedule regular health, dental, and eye exams.  Stay current with your vaccines.  Tell your health care provider if: ? You often feel depressed. ? You have ever been abused or do not feel safe at home. Summary  Adopting a healthy lifestyle and getting preventive care are important in promoting health and wellness.  Follow your health care provider's instructions about healthy  diet, exercising, and getting tested or screened for diseases.  Follow your health care provider's instructions on monitoring your cholesterol and blood pressure. This information is not intended to replace advice given to you by your health care provider. Make sure you discuss any questions you have with your health care provider. Document Revised: 09/17/2018 Document Reviewed: 09/17/2018 Elsevier Patient Education  2021 Elsevier Inc.  

## 2020-12-09 LAB — CERVICOVAGINAL ANCILLARY ONLY
Bacterial Vaginitis (gardnerella): POSITIVE — AB
Candida Glabrata: NEGATIVE
Candida Vaginitis: NEGATIVE
Chlamydia: NEGATIVE
Comment: NEGATIVE
Comment: NEGATIVE
Comment: NEGATIVE
Comment: NEGATIVE
Comment: NEGATIVE
Comment: NORMAL
Neisseria Gonorrhea: NEGATIVE
Trichomonas: NEGATIVE

## 2020-12-12 ENCOUNTER — Other Ambulatory Visit: Payer: Self-pay | Admitting: Family Medicine

## 2020-12-12 ENCOUNTER — Telehealth: Payer: Self-pay

## 2020-12-12 LAB — CYTOLOGY - PAP
Comment: NEGATIVE
Diagnosis: NEGATIVE
High risk HPV: NEGATIVE

## 2020-12-12 MED ORDER — METRONIDAZOLE 0.75 % VA GEL: 1.0000 | Freq: Every day | VAGINAL | 0 refills | Status: DC

## 2020-12-12 NOTE — Telephone Encounter (Signed)
Patient name and DOB has been verified Patient was informed of lab results. Patient had no questions.  

## 2020-12-12 NOTE — Telephone Encounter (Signed)
-----   Message from Hoy Register, MD sent at 12/12/2020  3:51 PM EST ----- PAP smear is negative for malignancy. Ancillary test is positive for BV which is not a STD. I have sent a rx fro Metrogel to her Pharmacy

## 2020-12-13 MED FILL — METRONIDAZOLE 0.75 % GEL: 0.75 | 30 days supply | Qty: 70 | Fill #0

## 2020-12-28 MED FILL — AMLODIPINE BESYLATE 10 MG T: 10 | 90 days supply | Qty: 90 | Fill #1

## 2021-01-07 ENCOUNTER — Other Ambulatory Visit: Payer: Self-pay

## 2021-04-14 ENCOUNTER — Other Ambulatory Visit: Payer: Self-pay

## 2021-04-14 MED FILL — Amlodipine Besylate Tab 10 MG (Base Equivalent): ORAL | 30 days supply | Qty: 30 | Fill #0 | Status: AC

## 2021-04-29 ENCOUNTER — Other Ambulatory Visit: Payer: Self-pay

## 2021-04-29 ENCOUNTER — Encounter (HOSPITAL_COMMUNITY): Payer: Self-pay | Admitting: *Deleted

## 2021-04-29 ENCOUNTER — Ambulatory Visit (HOSPITAL_COMMUNITY)
Admission: EM | Admit: 2021-04-29 | Discharge: 2021-04-29 | Disposition: A | Discharge status: Home / Self Care | Payer: Medicaid Other | Attending: Urgent Care | Admitting: Urgent Care

## 2021-04-29 DIAGNOSIS — S39012A Strain of muscle, fascia and tendon of lower back, initial encounter: Secondary | ICD-10-CM

## 2021-04-29 DIAGNOSIS — M62830 Muscle spasm of back: Secondary | ICD-10-CM

## 2021-04-29 LAB — POCT URINALYSIS DIPSTICK, ED / UC
Bilirubin Urine: NEGATIVE
Glucose, UA: NEGATIVE mg/dL
Ketones, ur: NEGATIVE mg/dL
Leukocytes,Ua: NEGATIVE
Nitrite: NEGATIVE
Protein, ur: NEGATIVE mg/dL
Specific Gravity, Urine: 1.02 (ref 1.005–1.030)
Urobilinogen, UA: 0.2 mg/dL (ref 0.0–1.0)
pH: 5.5 (ref 5.0–8.0)

## 2021-04-29 LAB — POC URINE PREG, ED: Preg Test, Ur: NEGATIVE

## 2021-04-29 MED ORDER — TIZANIDINE HCL 4 MG PO TABS: 4.0000 mg | ORAL_TABLET | Freq: Three times a day (TID) | ORAL | 0 refills | Status: DC | PRN

## 2021-04-29 MED ORDER — NAPROXEN 500 MG PO TABS: 500.0000 mg | ORAL_TABLET | Freq: Two times a day (BID) | ORAL | 0 refills | Status: DC

## 2021-04-29 NOTE — ED Triage Notes (Signed)
Pt reports back pain after moving boxes

## 2021-04-29 NOTE — ED Provider Notes (Signed)
[facility]   MRN: [id number] DOB: 28-Apr-1986  Subjective:   Kelsey Joyce is a 35 y.o. female presenting for 6 day history of acute onset persistent bilateral low back pain with slight stiffness.  Patient reports that symptoms started after she was moving a lot of heavy boxes.  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  She used ibuprofen a couple of times without any change in her symptoms.  Tries to stay well-hydrated.  Denies numbness, tingling, fall, trauma, bruising, swelling, radicular symptoms.  No current facility-administered medications for this encounter.  Current Outpatient Medications:    naproxen (NAPROSYN) 500 MG tablet, Take 1 tablet (500 mg total) by mouth 2 (two) times daily with a meal., Disp: 30 tablet, Rfl: 0   tiZANidine (ZANAFLEX) 4 MG tablet, Take 1 tablet (4 mg total) by mouth every 8 (eight) hours as needed., Disp: 30 tablet, Rfl: 0   amLODipine (NORVASC) 10 MG tablet, TAKE 1 TABLET (10 MG TOTAL) BY MOUTH EVERY MORNING., Disp: 90 tablet, Rfl: 1   chlorthalidone (HYGROTON) 25 MG tablet, TAKE 1 TABLET (25 MG TOTAL) BY MOUTH DAILY., Disp: 90 tablet, Rfl: 1   fluticasone (FLONASE) 50 MCG/ACT nasal spray, Place 2 sprays into both nostrils daily. (Patient not taking: No sig reported), Disp: 16 g, Rfl: 6   ibuprofen (ADVIL,MOTRIN) 200 MG tablet, Take 400 mg by mouth daily as needed for moderate pain., Disp: , Rfl:    ibuprofen (ADVIL,MOTRIN) 600 MG tablet, Take 1 tablet (600 mg total) by mouth every 12 (twelve) hours as needed for moderate pain. (Patient not taking: No sig reported), Disp: 30 tablet, Rfl: 0   metroNIDAZOLE (METROGEL) 0.75 % vaginal gel, PLACE 1 APPLICATORFUL VAGINALLY AT BEDTIME., Disp: 70 g, Rfl: 0   potassium chloride SA (KLOR-CON) 20 MEQ tablet, Take 1 tablet (20 mEq total) by mouth daily., Disp: 90 tablet, Rfl: 1   Allergies  Allergen Reactions   Acetaminophen Nausea Only   Asa [Aspirin] Nausea Only     Past Medical History:  Diagnosis Date   Anxiety    Hypertension      Past Surgical History:  Procedure Laterality Date   COLPOSCOPY     LEEP     ROBOTIC ASSISTED LAPAROSCOPIC CHOLECYSTECTOMY-MULTI SITE N/A 09/09/2018   Procedure: ROBOTIC ASSISTED LAPAROSCOPIC CHOLECYSTECTOMY-MULTI SITE;  Surgeon: Leafy Ro, MD;  Location: [facility] ORS;  Service: General;  Laterality: N/A;   UMBILICAL HERNIA REPAIR N/A 09/09/2018   Procedure: HERNIA REPAIR UMBILICAL ADULT;  Surgeon: Leafy Ro, MD;  Location: [facility];  Service: General;  Laterality: N/A;    Family History  Problem Relation Age of Onset   Hypertension Mother    Cervical cancer Other 11       Grandmother   Hypertension Paternal Aunt    Breast cancer Other 24       Aunt   Breast cancer Other        Great Aunt    Cancer Maternal Grandmother        cerival    Social History   Tobacco Use   Smoking status: Former    Packs/day: 0.20    Years: 5.00    Pack years: 1.00    Types: Cigarettes   Smokeless tobacco: Former    Quit date: 04/23/2018   Tobacco comments:    smoking 1-2 cigs per day  Vaping Use   Vaping Use: Never used  Substance Use Topics   Alcohol use: Yes  Alcohol/week: 0.0 standard drinks    Comment: occassionally   Drug use: Not Currently    Types: Marijuana    ROS   Objective:   Vitals: BP 121/88   Pulse 80   Temp 98.9 F (37.2 C)   Resp 16   LMP 03/08/2021   SpO2 100%   Physical Exam Constitutional:      General: She is not in acute distress.    Appearance: Normal appearance. She is well-developed. She is not ill-appearing, toxic-appearing or diaphoretic.  HENT:     Head: Normocephalic and atraumatic.     Nose: Nose normal.     Mouth/Throat:     Mouth: Mucous membranes are moist.     Pharynx: Oropharynx is clear.  Eyes:     General: No scleral icterus.    Extraocular Movements: Extraocular movements intact.     Pupils: Pupils are equal, round, and reactive to light.   Cardiovascular:     Rate and Rhythm: Normal rate.  Pulmonary:     Effort: Pulmonary effort is normal.  Musculoskeletal:     Comments: Full range of motion throughout.  Strength 5/5 for lower extremities.  Patient ambulates without any assistance at expected pace.  No ecchymosis, swelling, lacerations or abrasions.  Patient does have paraspinal muscle tenderness along the lumbar region of her back excluding the midline.  Tenderness worse over the upper paraspinal muscles of the lumbar region.  Skin:    General: Skin is warm and dry.  Neurological:     General: No focal deficit present.     Mental Status: She is alert and oriented to person, place, and time.     Motor: No weakness.     Coordination: Coordination normal.     Gait: Gait normal.     Deep Tendon Reflexes: Reflexes normal.  Psychiatric:        Mood and Affect: Mood normal.        Behavior: Behavior normal.        Thought Content: Thought content normal.        Judgment: Judgment normal.    Results for orders placed or performed during the hospital encounter of 04/29/21 (from the past 24 hour(s))  POCT Urinalysis Dipstick (ED/UC)     Status: Abnormal   Collection Time: 04/29/21 11:18 AM  Result Value Ref Range   Glucose, UA NEGATIVE NEGATIVE mg/dL   Bilirubin Urine NEGATIVE NEGATIVE   Ketones, ur NEGATIVE NEGATIVE mg/dL   Specific Gravity, Urine 1.020 1.005 - 1.030   Hgb urine dipstick TRACE (A) NEGATIVE   pH 5.5 5.0 - 8.0   Protein, ur NEGATIVE NEGATIVE mg/dL   Urobilinogen, UA 0.2 0.0 - 1.0 mg/dL   Nitrite NEGATIVE NEGATIVE   Leukocytes,Ua NEGATIVE NEGATIVE  POC urine preg, ED (not at [facility])     Status: None   Collection Time: 04/29/21 11:26 AM  Result Value Ref Range   Preg Test, Ur NEGATIVE NEGATIVE    Assessment and Plan :   PDMP not reviewed this encounter.  1. Strain of lumbar region, initial encounter   2. Muscle spasm of back     Will manage conservatively for back strain with NSAID and muscle  relaxant, rest and modification of physical activity.  Anticipatory guidance provided.  Counseled patient on potential for adverse effects with medications prescribed/recommended today, ER and return-to-clinic precautions discussed, patient verbalized understanding.    Wallis Bamberg, [place] 04/29/21 1220

## 2021-05-18 MED FILL — Amlodipine Besylate Tab 10 MG (Base Equivalent): ORAL | 90 days supply | Qty: 90 | Fill #1 | Status: AC

## 2021-05-19 ENCOUNTER — Other Ambulatory Visit: Payer: Self-pay

## 2021-05-22 ENCOUNTER — Other Ambulatory Visit: Payer: Self-pay

## 2021-06-30 ENCOUNTER — Encounter: Payer: Self-pay | Admitting: General Surgery

## 2021-08-24 ENCOUNTER — Other Ambulatory Visit: Payer: Self-pay

## 2021-08-24 MED FILL — Amlodipine Besylate Tab 10 MG (Base Equivalent): ORAL | 30 days supply | Qty: 30 | Fill #2 | Status: AC

## 2021-08-25 ENCOUNTER — Other Ambulatory Visit: Payer: Self-pay

## 2021-09-28 ENCOUNTER — Other Ambulatory Visit: Payer: Self-pay

## 2021-09-28 MED FILL — Amlodipine Besylate Tab 10 MG (Base Equivalent): ORAL | 30 days supply | Qty: 30 | Fill #3 | Status: AC

## 2021-09-28 MED FILL — Amlodipine Besylate Tab 10 MG (Base Equivalent): ORAL | 30 days supply | Qty: 30 | Fill #3 | Status: CN

## 2021-10-03 ENCOUNTER — Other Ambulatory Visit: Payer: Self-pay

## 2021-11-02 ENCOUNTER — Other Ambulatory Visit: Payer: Self-pay

## 2021-11-02 ENCOUNTER — Other Ambulatory Visit: Payer: Self-pay | Admitting: Family Medicine

## 2021-11-02 DIAGNOSIS — I10 Essential (primary) hypertension: Secondary | ICD-10-CM

## 2021-11-02 MED ORDER — AMLODIPINE BESYLATE 10 MG PO TABS
ORAL_TABLET | ORAL | 0 refills | Status: DC
  Filled 2021-11-02: qty 30, 30d supply, fill #0

## 2021-11-02 NOTE — Telephone Encounter (Signed)
Requested medication (s) are due for refill today: requesting early  Requested medication (s) are on the active medication list: yes  Last refill:  10/03/21 for #90  Future visit scheduled: no, last visit 12/08/20  Notes to clinic:  Failed protocol due to no valid visit within 6  months, no upcoming visit scheduled, please assess.   Requested Prescriptions  Pending Prescriptions Disp Refills   amLODipine (NORVASC) 10 MG tablet 90 tablet 1    Sig: TAKE 1 TABLET (10 MG TOTAL) BY MOUTH EVERY MORNING.     Cardiovascular:  Calcium Channel Blockers Failed - 11/02/2021  5:13 AM      Failed - Valid encounter within last 6 months    Recent Outpatient Visits           10 months ago Annual physical exam   [facility] Hoy Register, MD   1 year ago Screening for diabetes mellitus   [facility] [place], [place], [place]   2 years ago Esophageal spasm   [facility] Hoy Register, MD   2 years ago Screening for cervical cancer    [facility] Hoy [facility], [facility]   2 years ago Neck swelling    [facility] [place], Odette Horns, MD              Passed - Last BP in normal range    BP Readings from Last 1 Encounters:  04/29/21 121/88

## 2021-11-03 ENCOUNTER — Other Ambulatory Visit: Payer: Self-pay

## 2021-12-08 ENCOUNTER — Other Ambulatory Visit: Payer: Self-pay | Admitting: Family Medicine

## 2021-12-08 ENCOUNTER — Other Ambulatory Visit: Payer: Self-pay

## 2021-12-08 DIAGNOSIS — I10 Essential (primary) hypertension: Secondary | ICD-10-CM

## 2021-12-08 NOTE — Telephone Encounter (Signed)
Requested medication (s) are due for refill today - yes ? ?Requested medication (s) are on the active medication list -yes ? ?Future visit scheduled -yes ? ?Last refill: 11/02/21 #30 ? ?Notes to clinic: Call to patient- advised needs appointment- scheduled 3/23. Request RF until that appointment ? ?Requested Prescriptions  ?Pending Prescriptions Disp Refills  ? amLODipine (NORVASC) 10 MG tablet 30 tablet 0  ?  Sig: TAKE 1 TABLET (10 MG TOTAL) BY MOUTH EVERY MORNING.  ?  ? Cardiovascular: Calcium Channel Blockers 2 Failed - 12/08/2021  6:25 AM  ?  ?  Failed - Valid encounter within last 6 months  ?  Recent Outpatient Visits   ? ?      ? 1 year ago Annual physical exam  ? [facility] [place], Odette Horns, MD  ? 1 year ago Screening for diabetes mellitus  ? [facility] [place], [place], [place]  ? 2 years ago Esophageal spasm  ? [facility] [place], Odette Horns, MD  ? 2 years ago Screening for cervical cancer  ? [facility] [place], Odette Horns, MD  ? 2 years ago Neck swelling  ? [facility] Hoy Register, MD  ? ?  ?  ?Future Appointments   ? ?        ? In 3 weeks Sharon Seller, [place] [facility]  ? ?  ? ?  ?  ?  Passed - Last BP in normal range  ?  BP Readings from Last 1 Encounters:  ?04/29/21 121/88  ?  ?  ?  ?  Passed - Last Heart Rate in normal range  ?  Pulse Readings from Last 1 Encounters:  ?04/29/21 80  ?  ?  ?  ?  ? ? ? ?Requested Prescriptions  ?Pending Prescriptions Disp Refills  ? amLODipine (NORVASC) 10 MG tablet 30 tablet 0  ?  Sig: TAKE 1 TABLET (10 MG TOTAL) BY MOUTH EVERY MORNING.  ?  ? Cardiovascular: Calcium Channel Blockers 2 Failed - 12/08/2021  6:25 AM  ?  ?  Failed - Valid encounter within last 6 months  ?  Recent Outpatient Visits   ? ?      ? 1 year ago Annual physical exam  ? [facility] [place],  Odette Horns, MD  ? 1 year ago Screening for diabetes mellitus  ? [facility] [place], [place], [place]  ? 2 years ago Esophageal spasm  ? [facility] [place], Odette Horns, MD  ? 2 years ago Screening for cervical cancer  ? [facility] [place], Odette Horns, MD  ? 2 years ago Neck swelling  ? [facility] [place] [facility] Hoy Register, MD  ? ?  ?  ?Future Appointments   ? ?        ? In 3 weeks Sharon Seller, [place] [facility]  ? ?  ? ?  ?  ?  Passed - Last BP in normal range  ?  BP Readings from Last 1 Encounters:  ?04/29/21 121/88  ?  ?  ?  ?  Passed - Last Heart Rate in normal range  ?  Pulse Readings from Last 1 Encounters:  ?04/29/21 80  ?  ?  ?  ?  ? ? ? ?

## 2021-12-11 ENCOUNTER — Other Ambulatory Visit: Payer: Self-pay

## 2022-01-03 ENCOUNTER — Encounter: Payer: Self-pay | Admitting: Physician Assistant

## 2022-01-03 ENCOUNTER — Ambulatory Visit: Payer: Medicaid Other | Attending: Physician Assistant | Admitting: Physician Assistant

## 2022-01-03 ENCOUNTER — Other Ambulatory Visit: Payer: Self-pay

## 2022-01-03 VITALS — BP 124/82 | HR 74 | Wt 127.4 lb

## 2022-01-03 DIAGNOSIS — Z7901 Long term (current) use of anticoagulants: Secondary | ICD-10-CM | POA: Insufficient documentation

## 2022-01-03 DIAGNOSIS — F17210 Nicotine dependence, cigarettes, uncomplicated: Secondary | ICD-10-CM | POA: Insufficient documentation

## 2022-01-03 DIAGNOSIS — Z760 Encounter for issue of repeat prescription: Secondary | ICD-10-CM | POA: Insufficient documentation

## 2022-01-03 DIAGNOSIS — J3489 Other specified disorders of nose and nasal sinuses: Secondary | ICD-10-CM

## 2022-01-03 DIAGNOSIS — I10 Essential (primary) hypertension: Secondary | ICD-10-CM | POA: Insufficient documentation

## 2022-01-03 DIAGNOSIS — T465X6A Underdosing of other antihypertensive drugs, initial encounter: Secondary | ICD-10-CM | POA: Insufficient documentation

## 2022-01-03 MED ORDER — FLUTICASONE PROPIONATE 50 MCG/ACT NA SUSP
2.0000 | Freq: Every day | NASAL | 6 refills | Status: DC
Start: 2022-01-03 — End: 2022-07-09
  Filled 2022-01-03: qty 16, 30d supply, fill #0
  Filled 2022-04-01 – 2022-04-09 (×3): qty 16, 30d supply, fill #1
  Filled 2022-05-16: qty 16, 25d supply, fill #2
  Filled 2022-06-25: qty 16, 25d supply, fill #3

## 2022-01-03 MED ORDER — AMLODIPINE BESYLATE 10 MG PO TABS
ORAL_TABLET | ORAL | 1 refills | Status: DC
  Filled 2022-01-03: qty 30, 30d supply, fill #0
  Filled 2022-01-03: qty 90, 90d supply, fill #0
  Filled 2022-04-01 – 2022-04-02 (×2): qty 90, 90d supply, fill #1
  Filled 2022-04-09: qty 30, 30d supply, fill #1
  Filled 2022-05-16: qty 30, 30d supply, fill #2
  Filled 2022-06-25: qty 30, 30d supply, fill #3

## 2022-01-03 NOTE — Progress Notes (Signed)
Patient ID: Kelsey Joyce, female   DOB: Feb 08, 1986, 35 y.o.   MRN: [id number] ? ? ?Kelsey Joyce, is a 36 y.o. female ? ?[id number] ? ?[id number] ? ?DOB - [birth year] ? ?Chief Complaint  ?Patient presents with  ? Medication Refill  ?    ? ?Subjective:  ? ?Kelsey Joyce is a 36 y.o. female here today for BP check.  She has made a lot of lifestyle changes with eating less salt and only smoking about 1 cig per day.  She has not been taking BP med for about 1 month.  When she is checking her BP at home usu numbers are ~120/80.  She has only had 1 higher reading in the last month and that was when she had a sinus HA which she gets due to allergies. ? ? ?No problems updated. ? ?ALLERGIES: ?Allergies  ?Allergen Reactions  ? Acetaminophen Nausea Only  ? Asa [Aspirin] Nausea Only  ? ? ?PAST MEDICAL HISTORY: ?Past Medical History:  ?Diagnosis Date  ? Anxiety   ? Hypertension   ? ? ?MEDICATIONS AT HOME: ?Prior to Admission medications   ?Medication Sig Start Date End Date Taking? Authorizing Provider  ?amLODipine (NORVASC) 10 MG tablet TAKE 1 TABLET (10 MG TOTAL) BY MOUTH EVERY MORNING. 01/03/22 01/03/23  Anders Simmonds, PA-C  ?fluticasone (FLONASE) 50 MCG/ACT nasal spray Place 2 sprays into both nostrils daily. 01/03/22   Anders Simmonds, PA-C  ?ibuprofen (ADVIL,MOTRIN) 200 MG tablet Take 400 mg by mouth daily as needed for moderate pain.    [provider]  ?ibuprofen (ADVIL,MOTRIN) 600 MG tablet Take 1 tablet (600 mg total) by mouth every 12 (twelve) hours as needed for moderate pain. ?Patient not taking: No sig reported 08/27/17   Hoy Register, MD  ?naproxen (NAPROSYN) 500 MG tablet Take 1 tablet (500 mg total) by mouth 2 (two) times daily with a meal. 04/29/21   Wallis Bamberg, PA-C  ?tiZANidine (ZANAFLEX) 4 MG tablet Take 1 tablet (4 mg total) by mouth every 8 (eight) hours as needed. 04/29/21   Wallis Bamberg, PA-C  ? ? ?ROS: ?Neg HEENT ?Neg resp ?Neg cardiac ?Neg GI ?Neg GU ?Neg MS ?Neg psych ?Neg  neuro ? ?Objective:  ? ?Vitals:  ? 01/03/22 1347  ?BP: 124/82  ?Pulse: 74  ?SpO2: 100%  ?Weight: 127 lb 6.4 oz (57.8 kg)  ? ?Exam  recheck BP manually and it was 116/78 ?General appearance : Awake, alert, not in any distress. Speech Clear. Not toxic looking ?HEENT: Atraumatic and Normocephalic ?Neck: Supple, no JVD. No cervical lymphadenopathy.  ?Chest: Good air entry bilaterally, CTAB.  No rales/rhonchi/wheezing ?CVS: S1 S2 regular, no murmurs.  ?Extremities: B/L Lower Ext shows no edema, both legs are warm to touch ?Neurology: Awake alert, and oriented X 3, CN II-XII intact, Non focal ?Skin: No Rash ? ?Data Review ?Lab Results  ?Component Value Date  ? HGBA1C 5.6 08/25/2020  ? HGBA1C 5.8 (H) 04/25/2015  ? ? ?Assessment & Plan  ? ?1. Essential hypertension ?She may no longer need medications with the lifestyle changes she has made.blood pressure is normal with NOT taking medication for a month.  For now, do not take your blood pressure medication.  Check your blood pressure daily.  If your numbers average(over 2 weeks) <130/85 you do not need to take your medications.  ? ?If your blood pressures are averaging >130/85, restart amlodipine at 5mg (1/2 tablet daily) and continue to check blood pressures for 2 more weeks.  If your blood pressure is  still averaging more than 130/85, take the full 10mg  amlodipine.   ? ?Your blood pressure goal is <130/85 ?- Comprehensive metabolic panel ?- Lipid panel ?- CBC with Differential/Platelet ?- amLODipine (NORVASC) 10 MG tablet; TAKE 1 TABLET (10 MG TOTAL) BY MOUTH EVERY MORNING.  Dispense: 90 tablet; Refill: 1 ? ?2. Sinus pressure ?- fluticasone (FLONASE) 50 MCG/ACT nasal spray; Place 2 sprays into both nostrils daily.  Dispense: 16 g; Refill: 6 ? ? ? ?Patient have been counseled extensively about nutrition and exercise. Other issues discussed during this visit include: low cholesterol diet, weight control and daily exercise, foot care, annual eye examinations at Ophthalmology,  importance of adherence with medications and regular follow-up. We also discussed long term complications of uncontrolled diabetes and hypertension.  ? ?Return in about 6 months (around 07/06/2022) for PCP;  chronic conditions. ? ?The patient was given clear instructions to go to ER or return to medical center if symptoms don't improve, worsen or new problems develop. The patient verbalized understanding. The patient was told to call to get lab results if they haven't heard anything in the next week.  ? ? ? ? ?07/08/2022, PA-C ?[facility] ?[place], [place] ?[phone]   ?01/03/2022, 2:16 PM  ?

## 2022-01-03 NOTE — Progress Notes (Signed)
Pt states she would like a 3 month supply of htn medication if possible. ?

## 2022-01-03 NOTE — Patient Instructions (Signed)
As we discussed today your blood pressure is normal with you NOT taking medication for a month.  For now, do not take your blood pressure medication.  Check your blood pressure daily.  If your numbers average(over 2 weeks) <130/85 you do not need to take your medications.  ? ?If your blood pressures are averaging >130/85, restart amlodipine at 5mg (1/2 tablet daily) and continue to check blood pressures for 2 more weeks.  If your blood pressure is still averaging more than 130/85, take the full 10mg  amlodipine.   ? ?Your blood pressure goal is <130/85 ?

## 2022-01-04 LAB — CBC WITH DIFFERENTIAL/PLATELET
Basophils Absolute: 0 10*3/uL (ref 0.0–0.2)
Basos: 0 %
EOS (ABSOLUTE): 0 10*3/uL (ref 0.0–0.4)
Eos: 0 %
Hematocrit: 35.5 % (ref 34.0–46.6)
Hemoglobin: 11.8 g/dL (ref 11.1–15.9)
Immature Grans (Abs): 0 10*3/uL (ref 0.0–0.1)
Immature Granulocytes: 0 %
Lymphocytes Absolute: 3.7 10*3/uL — ABNORMAL HIGH (ref 0.7–3.1)
Lymphs: 39 %
MCH: 29.3 pg (ref 26.6–33.0)
MCHC: 33.2 g/dL (ref 31.5–35.7)
MCV: 88 fL (ref 79–97)
Monocytes Absolute: 1.1 10*3/uL — ABNORMAL HIGH (ref 0.1–0.9)
Monocytes: 12 %
Neutrophils Absolute: 4.5 10*3/uL (ref 1.4–7.0)
Neutrophils: 49 %
Platelets: 181 10*3/uL (ref 150–450)
RBC: 4.03 x10E6/uL (ref 3.77–5.28)
RDW: 14.4 % (ref 11.7–15.4)
WBC: 9.4 10*3/uL (ref 3.4–10.8)

## 2022-01-04 LAB — COMPREHENSIVE METABOLIC PANEL
ALT: 11 IU/L (ref 0–32)
AST: 17 IU/L (ref 0–40)
Albumin/Globulin Ratio: 1.8 (ref 1.2–2.2)
Albumin: 4.4 g/dL (ref 3.8–4.8)
Alkaline Phosphatase: 66 IU/L (ref 44–121)
BUN/Creatinine Ratio: 12 (ref 9–23)
BUN: 9 mg/dL (ref 6–20)
Bilirubin Total: <0.2 mg/dL (ref 0.0–1.2)
CO2: 21 mmol/L (ref 20–29)
Calcium: 9.1 mg/dL (ref 8.7–10.2)
Chloride: 107 mmol/L — ABNORMAL HIGH (ref 96–106)
Creatinine, Ser: 0.76 mg/dL (ref 0.57–1.00)
Globulin, Total: 2.5 g/dL (ref 1.5–4.5)
Glucose: 92 mg/dL (ref 70–99)
Potassium: 3.8 mmol/L (ref 3.5–5.2)
Sodium: 141 mmol/L (ref 134–144)
Total Protein: 6.9 g/dL (ref 6.0–8.5)
eGFR: 105 mL/min/{1.73_m2} (ref >59)

## 2022-01-04 LAB — LIPID PANEL
Chol/HDL Ratio: 2 ratio (ref 0.0–4.4)
Cholesterol, Total: 113 mg/dL (ref 100–199)
HDL: 57 mg/dL (ref >39)
LDL Chol Calc (NIH): 46 mg/dL (ref 0–99)
Triglycerides: 37 mg/dL (ref 0–149)
VLDL Cholesterol Cal: 10 mg/dL (ref 5–40)

## 2022-04-02 ENCOUNTER — Other Ambulatory Visit (HOSPITAL_COMMUNITY): Payer: Self-pay

## 2022-04-02 ENCOUNTER — Other Ambulatory Visit: Payer: Self-pay

## 2022-04-06 ENCOUNTER — Other Ambulatory Visit: Payer: Self-pay

## 2022-04-09 ENCOUNTER — Other Ambulatory Visit: Payer: Self-pay

## 2022-04-13 ENCOUNTER — Other Ambulatory Visit: Payer: Self-pay

## 2022-05-16 ENCOUNTER — Other Ambulatory Visit: Payer: Self-pay

## 2022-06-25 ENCOUNTER — Other Ambulatory Visit: Payer: Self-pay

## 2022-07-09 ENCOUNTER — Other Ambulatory Visit: Payer: Self-pay

## 2022-07-09 ENCOUNTER — Ambulatory Visit: Payer: Medicaid Other | Attending: Family Medicine | Admitting: Family Medicine

## 2022-07-09 ENCOUNTER — Encounter: Payer: Self-pay | Admitting: Family Medicine

## 2022-07-09 VITALS — BP 120/60 | HR 94 | Temp 98.3°F | Ht 63.0 in | Wt 126.8 lb

## 2022-07-09 DIAGNOSIS — I10 Essential (primary) hypertension: Secondary | ICD-10-CM

## 2022-07-09 DIAGNOSIS — Z1159 Encounter for screening for other viral diseases: Secondary | ICD-10-CM

## 2022-07-09 DIAGNOSIS — J3489 Other specified disorders of nose and nasal sinuses: Secondary | ICD-10-CM

## 2022-07-09 MED ORDER — AMLODIPINE BESYLATE 5 MG PO TABS
5.0000 mg | ORAL_TABLET | Freq: Every day | ORAL | 1 refills | Status: DC
  Filled 2022-07-09 – 2022-08-16 (×2): qty 90, 90d supply, fill #0
  Filled 2022-11-20: qty 90, 90d supply, fill #1

## 2022-07-09 MED ORDER — FLUTICASONE PROPIONATE 50 MCG/ACT NA SUSP
2.0000 | Freq: Every day | NASAL | 6 refills | Status: DC
  Filled 2022-07-09: qty 16, 25d supply, fill #0
  Filled 2022-10-19 – 2022-11-20 (×2): qty 16, 30d supply, fill #0

## 2022-07-09 NOTE — Patient Instructions (Signed)
Managing Your Hypertension Hypertension, also called high blood pressure, is when the force of the blood pressing against the walls of the arteries is too strong. Arteries are blood vessels that carry blood from your heart throughout your body. Hypertension forces the heart to work harder to pump blood and may cause the arteries to become narrow or stiff. Understanding blood pressure readings A blood pressure reading includes a higher number over a lower number: The first, or top, number is called the systolic pressure. It is a measure of the pressure in your arteries as your heart beats. The second, or bottom number, is called the diastolic pressure. It is a measure of the pressure in your arteries as the heart relaxes. For most people, a normal blood pressure is below 120/80. Your personal target blood pressure may vary depending on your medical conditions, your age, and other factors. Blood pressure is classified into four stages. Based on your blood pressure reading, your health care provider may use the following stages to determine what type of treatment you need, if any. Systolic pressure and diastolic pressure are measured in a unit called millimeters of mercury (mmHg). Normal Systolic pressure: below 120. Diastolic pressure: below 80. Elevated Systolic pressure: 120-129. Diastolic pressure: below 80. Hypertension stage 1 Systolic pressure: 130-139. Diastolic pressure: 80-89. Hypertension stage 2 Systolic pressure: 140 or above. Diastolic pressure: 90 or above. How can this condition affect me? Managing your hypertension is very important. Over time, hypertension can damage the arteries and decrease blood flow to parts of the body, including the brain, heart, and kidneys. Having untreated or uncontrolled hypertension can lead to: A heart attack. A stroke. A weakened blood vessel (aneurysm). Heart failure. Kidney damage. Eye damage. Memory and concentration problems. Vascular  dementia. What actions can I take to manage this condition? Hypertension can be managed by making lifestyle changes and possibly by taking medicines. Your health care provider will help you make a plan to bring your blood pressure within a normal range. You may be referred for counseling on a healthy diet and physical activity. Nutrition  Eat a diet that is high in fiber and potassium, and low in salt (sodium), added sugar, and fat. An example eating plan is called the DASH diet. DASH stands for Dietary Approaches to Stop Hypertension. To eat this way: Eat plenty of fresh fruits and vegetables. Try to fill one-half of your plate at each meal with fruits and vegetables. Eat whole grains, such as whole-wheat pasta, brown rice, or whole-grain bread. Fill about one-fourth of your plate with whole grains. Eat low-fat dairy products. Avoid fatty cuts of meat, processed or cured meats, and poultry with skin. Fill about one-fourth of your plate with lean proteins such as fish, chicken without skin, beans, eggs, and tofu. Avoid pre-made and processed foods. These tend to be higher in sodium, added sugar, and fat. Reduce your daily sodium intake. Many people with hypertension should eat less than 1,500 mg of sodium a day. Lifestyle  Work with your health care provider to maintain a healthy body weight or to lose weight. Ask what an ideal weight is for you. Get at least 30 minutes of exercise that causes your heart to beat faster (aerobic exercise) most days of the week. Activities may include walking, swimming, or biking. Include exercise to strengthen your muscles (resistance exercise), such as weight lifting, as part of your weekly exercise routine. Try to do these types of exercises for 30 minutes at least 3 days a week. Do   not use any products that contain nicotine or tobacco. These products include cigarettes, chewing tobacco, and vaping devices, such as e-cigarettes. If you need help quitting, ask your  health care provider. Control any long-term (chronic) conditions you have, such as high cholesterol or diabetes. Identify your sources of stress and find ways to manage stress. This may include meditation, deep breathing, or making time for fun activities. Alcohol use Do not drink alcohol if: Your health care provider tells you not to drink. You are pregnant, may be pregnant, or are planning to become pregnant. If you drink alcohol: Limit how much you have to: 0-1 drink a day for women. 0-2 drinks a day for men. Know how much alcohol is in your drink. In the [place] one drink equals one 12 oz bottle of beer (355 mL), one 5 oz glass of wine (148 mL), or one 1 oz glass of hard liquor (44 mL). Medicines Your health care provider may prescribe medicine if lifestyle changes are not enough to get your blood pressure under control and if: Your systolic blood pressure is 130 or higher. Your diastolic blood pressure is 80 or higher. Take medicines only as told by your health care provider. Follow the directions carefully. Blood pressure medicines must be taken as told by your health care provider. The medicine does not work as well when you skip doses. Skipping doses also puts you at risk for problems. Monitoring Before you monitor your blood pressure: Do not smoke, drink caffeinated beverages, or exercise within 30 minutes before taking a measurement. Use the bathroom and empty your bladder (urinate). Sit quietly for at least 5 minutes before taking measurements. Monitor your blood pressure at home as told by your health care provider. To do this: Sit with your back straight and supported. Place your feet flat on the floor. Do not cross your legs. Support your arm on a flat surface, such as a table. Make sure your upper arm is at heart level. Each time you measure, take two or three readings one minute apart and record the results. You may also need to have your blood pressure checked regularly by  your health care provider. General information Talk with your health care provider about your diet, exercise habits, and other lifestyle factors that may be contributing to hypertension. Review all the medicines you take with your health care provider because there may be side effects or interactions. Keep all follow-up visits. Your health care provider can help you create and adjust your plan for managing your high blood pressure. Where to find more information National Heart, Lung, and Blood Institute: www.nhlbi.nih.gov American Heart Association: www.heart.org Contact a health care provider if: You think you are having a reaction to medicines you have taken. You have repeated (recurrent) headaches. You feel dizzy. You have swelling in your ankles. You have trouble with your vision. Get help right away if: You develop a severe headache or confusion. You have unusual weakness or numbness, or you feel faint. You have severe pain in your chest or abdomen. You vomit repeatedly. You have trouble breathing. These symptoms may be an emergency. Get help right away. Call 911. Do not wait to see if the symptoms will go away. Do not drive yourself to the hospital. Summary Hypertension is when the force of blood pumping through your arteries is too strong. If this condition is not controlled, it may put you at risk for serious complications. Your personal target blood pressure may vary depending on your medical conditions,   your age, and other factors. For most people, a normal blood pressure is less than 120/80. Hypertension is managed by lifestyle changes, medicines, or both. Lifestyle changes to help manage hypertension include losing weight, eating a healthy, low-sodium diet, exercising more, stopping smoking, and limiting alcohol. This information is not intended to replace advice given to you by your health care provider. Make sure you discuss any questions you have with your health care  provider. Document Revised: 06/08/2021 Document Reviewed: 06/08/2021 Elsevier Patient Education  2023 [employer]  

## 2022-07-09 NOTE — Progress Notes (Signed)
Subjective:  Patient ID: Kelsey Joyce, female    DOB: 08-11-1986  Age: 36 y.o. MRN: [id number]  CC: Hypertension   HPI Kelsey Joyce is a 36 y.o. year old female with a history of Lsil, HPV+ in 01/2017, s/p LEEP. She has had 2 consecutive negative Pap smears since her positive Pap smear in 01/2018 (LSIL, HPV -ve); negative in 06/2019 and negative in 12/2020.  Interval History:  She has been taking Amlodipine every other day with the goal of coming off her antihypertensive.  On some days she has noticed frontal headache At home BP has been around 509-326 systolic.  She denies additional concerns today and exercises regularly by means of walking at her job and her diet consists of increased fruits and vegetables.  Past Medical History:  Diagnosis Date   Anxiety    Hypertension     Past Surgical History:  Procedure Laterality Date   COLPOSCOPY     LEEP     ROBOTIC ASSISTED LAPAROSCOPIC CHOLECYSTECTOMY-MULTI SITE N/A 09/09/2018   Procedure: ROBOTIC ASSISTED LAPAROSCOPIC CHOLECYSTECTOMY-MULTI SITE;  Surgeon: Jules Husbands, MD;  Location: [facility] ORS;  Service: General;  Laterality: N/A;   UMBILICAL HERNIA REPAIR N/A 09/09/2018   Procedure: HERNIA REPAIR UMBILICAL ADULT;  Surgeon: Jules Husbands, MD;  Location: [facility] ORS;  Service: General;  Laterality: N/A;    Family History  Problem Relation Age of Onset   Hypertension Mother    Cervical cancer Other 86       Grandmother   Hypertension Paternal Aunt    Breast cancer Other 39       Aunt   Breast cancer Other        Great Aunt    Cancer Maternal Grandmother        cerival    Social History   Socioeconomic History   Marital status: Single    Spouse name: Not on file   Number of children: Not on file   Years of education: Not on file   Highest education level: Not on file  Occupational History   Not on file  Tobacco Use   Smoking status: Former    Packs/day: 0.20    Years: 5.00    Total pack years: 1.00    Types:  Cigarettes   Smokeless tobacco: Former    Quit date: 04/23/2018   Tobacco comments:    smoking 1-2 cigs per day  Vaping Use   Vaping Use: Never used  Substance and Sexual Activity   Alcohol use: Yes    Alcohol/week: 0.0 standard drinks of alcohol    Comment: occassionally   Drug use: Not Currently    Types: Marijuana   Sexual activity: Yes    Birth control/protection: Condom  Other Topics Concern   Not on file  Social History Narrative   Not on file   Social Determinants of Health   Financial Resource Strain: Not on file  Food Insecurity: Not on file  Transportation Needs: Not on file  Physical Activity: Not on file  Stress: Not on file  Social Connections: Not on file    Allergies  Allergen Reactions   Acetaminophen Nausea Only   Asa [Aspirin] Nausea Only    Outpatient Medications Prior to Visit  Medication Sig Dispense Refill   tiZANidine (ZANAFLEX) 4 MG tablet Take 1 tablet (4 mg total) by mouth every 8 (eight) hours as needed. 30 tablet 0   amLODipine (NORVASC) 10 MG tablet TAKE 1 TABLET (10 MG TOTAL) BY MOUTH EVERY  MORNING. 90 tablet 1   fluticasone (FLONASE) 50 MCG/ACT nasal spray Place 2 sprays into both nostrils daily. 16 g 6   ibuprofen (ADVIL,MOTRIN) 200 MG tablet Take 400 mg by mouth daily as needed for moderate pain. (Patient not taking: Reported on 07/09/2022)     ibuprofen (ADVIL,MOTRIN) 600 MG tablet Take 1 tablet (600 mg total) by mouth every 12 (twelve) hours as needed for moderate pain. (Patient not taking: Reported on 03/19/2019) 30 tablet 0   naproxen (NAPROSYN) 500 MG tablet Take 1 tablet (500 mg total) by mouth 2 (two) times daily with a meal. (Patient not taking: Reported on 07/09/2022) 30 tablet 0   No facility-administered medications prior to visit.     ROS Review of Systems  Constitutional:  Negative for activity change and appetite change.  HENT:  Negative for sinus pressure and sore throat.   Respiratory:  Negative for chest tightness,  shortness of breath and wheezing.   Cardiovascular:  Negative for chest pain and palpitations.  Gastrointestinal:  Negative for abdominal distention, abdominal pain and constipation.  Genitourinary: Negative.   Musculoskeletal: Negative.   Psychiatric/Behavioral:  Negative for behavioral problems and dysphoric mood.     Objective:  BP 120/60   Pulse 94   Temp 98.3 F (36.8 C) (Oral)   Ht 5\' 3"  (1.6 m)   Wt 126 lb 12.8 oz (57.5 kg)   LMP 06/13/2022   SpO2 99%   BMI 22.46 kg/m      07/09/2022    2:27 PM 07/09/2022    1:58 PM 01/03/2022    1:47 PM  BP/Weight  Systolic BP [id number]  Diastolic BP 60 82 82  Wt. (Lbs)  126.8 127.4  BMI  22.46 kg/m2 19.95 kg/m2    Wt Readings from Last 3 Encounters:  07/09/22 126 lb 12.8 oz (57.5 kg)  01/03/22 127 lb 6.4 oz (57.8 kg)  12/08/20 129 lb (58.5 kg)      Physical Exam Constitutional:      Appearance: She is well-developed.  Cardiovascular:     Rate and Rhythm: Normal rate.     Heart sounds: Normal heart sounds. No murmur heard. Pulmonary:     Effort: Pulmonary effort is normal.     Breath sounds: Normal breath sounds. No wheezing or rales.  Chest:     Chest wall: No tenderness.  Abdominal:     General: Bowel sounds are normal. There is no distension.     Palpations: Abdomen is soft. There is no mass.     Tenderness: There is no abdominal tenderness.  Musculoskeletal:        General: Normal range of motion.     Right lower leg: No edema.     Left lower leg: No edema.  Neurological:     Mental Status: She is alert and oriented to person, place, and time.  Psychiatric:        Mood and Affect: Mood normal.        Latest Ref Rng & Units 01/03/2022    2:21 PM 08/25/2020   10:29 AM 05/18/2019    8:21 PM  CMP  Glucose 70 - 99 mg/dL 92  73  104   BUN 6 - 20 mg/dL 9  7  6    Creatinine 0.57 - 1.00 mg/dL 0.76  0.80  0.77   Sodium 134 - 144 mmol/L 141  142  138   Potassium 3.5 - 5.2 mmol/L 3.8  4.2  3.0   Chloride 96 -  106 mmol/L 107  108  107   CO2 20 - 29 mmol/L 21  21  23    Calcium 8.7 - 10.2 mg/dL 9.1  8.6  9.0   Total Protein 6.0 - 8.5 g/dL 6.9  6.4  7.9   Total Bilirubin 0.0 - 1.2 mg/dL <0.2  <0.2  0.7   Alkaline Phos 44 - 121 IU/L 66  69  65   AST 0 - 40 IU/L 17  14  17    ALT 0 - 32 IU/L 11  9  15      Lipid Panel     Component Value Date/Time   CHOL 113 01/03/2022 1421   TRIG 37 01/03/2022 1421   HDL 57 01/03/2022 1421   CHOLHDL 2.0 01/03/2022 1421   CHOLHDL 3.2 Ratio 10/14/2009 2108   VLDL 9 10/14/2009 2108   LDLCALC 46 01/03/2022 1421    CBC    Component Value Date/Time   WBC 9.4 01/03/2022 1421   WBC 9.2 05/18/2019 2021   RBC 4.03 01/03/2022 1421   RBC 4.02 05/18/2019 2021   HGB 11.8 01/03/2022 1421   HCT 35.5 01/03/2022 1421   PLT 181 01/03/2022 1421   MCV 88 01/03/2022 1421   MCH 29.3 01/03/2022 1421   MCH 29.4 05/18/2019 2021   MCHC 33.2 01/03/2022 1421   MCHC 33.5 05/18/2019 2021   RDW 14.4 01/03/2022 1421   LYMPHSABS 3.7 (H) 01/03/2022 1421   MONOABS 0.4 04/08/2018 1521   EOSABS 0.0 01/03/2022 1421   BASOSABS 0.0 01/03/2022 1421    Lab Results  Component Value Date   HGBA1C 5.6 08/25/2020    Assessment & Plan:  1. Essential hypertension Blood pressure is on the soft side Decrease amlodipine dose from 10 mg down to 5 mg and she will continue with her ambulatory blood pressure logs Advised that if blood pressure starts to rise she will need to increase back to 10 mg of amlodipine Counseled on blood pressure goal of less than 130/80, low-sodium, DASH diet, medication compliance, 150 minutes of moderate intensity exercise per week. Discussed medication compliance, adverse effects. - Basic Metabolic Panel - amLODipine (NORVASC) 5 MG tablet; Take 1 tablet (5 mg total) by mouth daily.  Dispense: 90 tablet; Refill: 1  2. Sinus pressure Chronic sinusitis - fluticasone (FLONASE) 50 MCG/ACT nasal spray; Place 2 sprays into both nostrils daily.  Dispense: 16 g;  Refill: 6  3. Screening for viral disease - HCV Ab w Reflex to Quant PCR   HCM: Next Pap smear due in 12/2023  Meds ordered this encounter  Medications   amLODipine (NORVASC) 5 MG tablet    Sig: Take 1 tablet (5 mg total) by mouth daily.    Dispense:  90 tablet    Refill:  1    Dose decreased   fluticasone (FLONASE) 50 MCG/ACT nasal spray    Sig: Place 2 sprays into both nostrils daily.    Dispense:  16 g    Refill:  6    Follow-up: Return in about 6 months (around 01/08/2023) for Chronic medical conditions.       Charlott Rakes, MD, FAAFP. [facility] [place], [place]   07/09/2022, 2:36 PM

## 2022-07-10 LAB — BASIC METABOLIC PANEL
BUN/Creatinine Ratio: 9 (ref 9–23)
BUN: 7 mg/dL (ref 6–20)
CO2: 21 mmol/L (ref 20–29)
Calcium: 9 mg/dL (ref 8.7–10.2)
Chloride: 105 mmol/L (ref 96–106)
Creatinine, Ser: 0.79 mg/dL (ref 0.57–1.00)
Glucose: 74 mg/dL (ref 70–99)
Potassium: 4.1 mmol/L (ref 3.5–5.2)
Sodium: 139 mmol/L (ref 134–144)
eGFR: 99 mL/min/{1.73_m2} (ref >59)

## 2022-07-10 LAB — HCV INTERPRETATION

## 2022-07-10 LAB — HCV AB W REFLEX TO QUANT PCR: HCV Ab: NONREACTIVE

## 2022-07-16 ENCOUNTER — Other Ambulatory Visit: Payer: Self-pay

## 2022-08-16 ENCOUNTER — Other Ambulatory Visit: Payer: Self-pay

## 2022-10-19 ENCOUNTER — Other Ambulatory Visit (HOSPITAL_COMMUNITY): Payer: Self-pay

## 2022-10-23 ENCOUNTER — Other Ambulatory Visit (HOSPITAL_BASED_OUTPATIENT_CLINIC_OR_DEPARTMENT_OTHER): Payer: Self-pay

## 2022-10-23 ENCOUNTER — Encounter (HOSPITAL_BASED_OUTPATIENT_CLINIC_OR_DEPARTMENT_OTHER): Payer: Self-pay

## 2022-10-24 ENCOUNTER — Other Ambulatory Visit: Payer: Self-pay

## 2022-11-20 ENCOUNTER — Other Ambulatory Visit: Payer: Self-pay

## 2023-01-08 ENCOUNTER — Ambulatory Visit: Payer: Medicaid Other | Attending: Family Medicine | Admitting: Family Medicine

## 2023-01-15 ENCOUNTER — Other Ambulatory Visit (HOSPITAL_COMMUNITY)
Admission: RE | Admit: 2023-01-15 | Discharge: 2023-01-15 | Disposition: A | Discharge status: Home / Self Care | Payer: Medicaid Other | Source: Ambulatory Visit | Attending: Family Medicine | Admitting: Family Medicine

## 2023-01-15 ENCOUNTER — Ambulatory Visit: Payer: Medicaid Other | Attending: Family Medicine | Admitting: Family Medicine

## 2023-01-15 ENCOUNTER — Other Ambulatory Visit: Payer: Self-pay

## 2023-01-15 VITALS — BP 134/88 | Temp 99.1°F | Ht 63.0 in | Wt 132.0 lb

## 2023-01-15 DIAGNOSIS — L290 Pruritus ani: Secondary | ICD-10-CM | POA: Diagnosis not present

## 2023-01-15 DIAGNOSIS — Z113 Encounter for screening for infections with a predominantly sexual mode of transmission: Secondary | ICD-10-CM | POA: Diagnosis present

## 2023-01-15 DIAGNOSIS — J3489 Other specified disorders of nose and nasal sinuses: Secondary | ICD-10-CM | POA: Diagnosis not present

## 2023-01-15 DIAGNOSIS — I10 Essential (primary) hypertension: Secondary | ICD-10-CM | POA: Diagnosis not present

## 2023-01-15 DIAGNOSIS — Z131 Encounter for screening for diabetes mellitus: Secondary | ICD-10-CM

## 2023-01-15 MED ORDER — FLUTICASONE PROPIONATE 50 MCG/ACT NA SUSP
2.0000 | Freq: Every day | NASAL | 6 refills | Status: DC
  Filled 2023-01-15 – 2023-03-15 (×3): qty 16, 30d supply, fill #0

## 2023-01-15 MED ORDER — AMLODIPINE BESYLATE 5 MG PO TABS
5.0000 mg | ORAL_TABLET | Freq: Every day | ORAL | 1 refills | Status: DC
  Filled 2023-01-15 – 2023-03-15 (×3): qty 90, 90d supply, fill #0
  Filled 2023-07-04: qty 90, 90d supply, fill #1
  Filled 2023-07-05: qty 90, 90d supply, fill #0

## 2023-01-15 MED ORDER — CLOTRIMAZOLE 1 % EX CREA
1.0000 | TOPICAL_CREAM | Freq: Two times a day (BID) | CUTANEOUS | 0 refills | Status: AC
Start: 2023-01-15 — End: ?
  Filled 2023-01-15: qty 30, 15d supply, fill #0

## 2023-01-15 NOTE — Progress Notes (Signed)
Subjective:  Patient ID: Kelsey Joyce, female    DOB: [birth year]  Age: 37 y.o. MRN: [id number]  CC: Vaginal Itching   HPI Kelsey Smothersameshia Golz is a 37 y.o. year old female with a history of  Lsil, HPV+ in 01/2017, s/p LEEP. She has had 2 consecutive negative Pap smears since her positive Pap smear in 01/2018 (LSIL, HPV -ve); negative in 06/2019 and negative in 12/2020.  Interval History:  She has a vaginal discharge which is clear, thick with a pinkish tinge and it does not smell good. Also feels like she is 'urinating on herself' and feels her gluteal cleft is irritated. She had unprotected sexual intercourse 1 month ago. LMP was 01/08/23. She states she feels her body is different and she would like to be tested for "everything possible". Endorses adherence with her antihypertensive.  She will need a refill on Flonase. Past Medical History:  Diagnosis Date   Anxiety    Hypertension     Past Surgical History:  Procedure Laterality Date   COLPOSCOPY     LEEP     ROBOTIC ASSISTED LAPAROSCOPIC CHOLECYSTECTOMY-MULTI SITE N/A 09/09/2018   Procedure: ROBOTIC ASSISTED LAPAROSCOPIC CHOLECYSTECTOMY-MULTI SITE;  Surgeon: Leafy RoPabon, Diego F, MD;  Location: [facility] ORS;  Service: General;  Laterality: N/A;   UMBILICAL HERNIA REPAIR N/A 09/09/2018   Procedure: HERNIA REPAIR UMBILICAL ADULT;  Surgeon: Leafy RoPabon, Diego F, MD;  Location: [facility] OR[facility];  Service: General;  Laterality: N/A;    Family History  Problem Relation Age of Onset   Hypertension Mother    Cervical cancer Other 7260       Grandmother   Hypertension Paternal Aunt    Breast cancer Other 5039       Aunt   Breast cancer Other        Great Aunt    Cancer Maternal Grandmother        cerival    Social History   Socioeconomic History   Marital status: Single    Spouse name: Not on file   Number of children: Not on file   Years of education: Not on file   Highest education level: Some college, no degree  Occupational History   Not on file   Tobacco Use   Smoking status: Former    Packs/day: 0.20    Years: 5.00    Additional pack years: 0.00    Total pack years: 1.00    Types: Cigarettes   Smokeless tobacco: Former    Quit date: 04/23/2018   Tobacco comments:    smoking 1-2 cigs per day  Vaping Use   Vaping Use: Never used  Substance and Sexual Activity   Alcohol use: Yes    Alcohol/week: 0.0 standard drinks of alcohol    Comment: occassionally   Drug use: Not Currently    Types: Marijuana   Sexual activity: Yes    Birth control/protection: Condom  Other Topics Concern   Not on file  Social History Narrative   Not on file   Social Determinants of Health   Financial Resource Strain: Low Risk  (01/15/2023)   Overall Financial Resource Strain (CARDIA)    Difficulty of Paying Living Expenses: Not very hard  Food Insecurity: Unknown (01/15/2023)   Hunger Vital Sign    Worried About Running Out of Food in the Last Year: Never true    Ran Out of Food in the Last Year: Patient declined  Transportation Needs: No Transportation Needs (01/15/2023)   PRAPARE - Transportation    Lack  of Transportation (Medical): No    Lack of Transportation (Non-Medical): No  Physical Activity: Insufficiently Active (01/15/2023)   Exercise Vital Sign    Days of Exercise per Week: 3 days    Minutes of Exercise per Session: 20 min  Stress: No Stress Concern Present (01/15/2023)   [employer] of Occupational Health - Occupational Stress Questionnaire    Feeling of Stress : Only a little  Social Connections: Unknown (01/15/2023)   Social Connection and Isolation Panel [NHANES]    Frequency of Communication with Friends and Family: Patient declined    Frequency of Social Gatherings with Friends and Family: Patient declined    Attends Religious Services: Patient declined    Database administrator or Organizations: No    Attends Engineer, structural: Not on file    Marital Status: Never married    Allergies  Allergen Reactions    Acetaminophen Nausea Only   Asa [Aspirin] Nausea Only    Outpatient Medications Prior to Visit  Medication Sig Dispense Refill   ibuprofen (ADVIL,MOTRIN) 200 MG tablet Take 400 mg by mouth daily as needed for moderate pain. (Patient not taking: Reported on 07/09/2022)     tiZANidine (ZANAFLEX) 4 MG tablet Take 1 tablet (4 mg total) by mouth every 8 (eight) hours as needed. 30 tablet 0   amLODipine (NORVASC) 5 MG tablet Take 1 tablet (5 mg total) by mouth daily. 90 tablet 1   fluticasone (FLONASE) 50 MCG/ACT nasal spray Place 2 sprays into both nostrils daily. 16 g 6   No facility-administered medications prior to visit.     ROS Review of Systems  Constitutional:  Negative for activity change and appetite change.  HENT:  Negative for sinus pressure and sore throat.   Respiratory:  Negative for chest tightness, shortness of breath and wheezing.   Cardiovascular:  Negative for chest pain and palpitations.  Gastrointestinal:  Negative for abdominal distention, abdominal pain and constipation.  Genitourinary:  Positive for vaginal discharge.  Musculoskeletal: Negative.   Psychiatric/Behavioral:  Negative for behavioral problems and dysphoric mood.     Objective:  BP 134/88   Temp 99.1 F (37.3 C)   Ht 5\' 3"  (1.6 m)   Wt 132 lb (59.9 kg)   LMP 01/08/2023   SpO2 100%   BMI 23.38 kg/m      01/15/2023    9:43 AM 01/15/2023    9:06 AM 07/09/2022    2:27 PM  BP/Weight  Systolic BP 134 129 120  Diastolic BP 88 90 60  Wt. (Lbs)  132   BMI  23.38 kg/m2       Physical Exam Constitutional:      Appearance: She is well-developed.  Cardiovascular:     Rate and Rhythm: Normal rate.     Heart sounds: Normal heart sounds. No murmur heard. Pulmonary:     Effort: Pulmonary effort is normal.     Breath sounds: Normal breath sounds. No wheezing or rales.  Chest:     Chest wall: No tenderness.  Abdominal:     General: Bowel sounds are normal. There is no distension.     Palpations:  Abdomen is soft. There is no mass.     Tenderness: There is no abdominal tenderness.  Musculoskeletal:        General: Normal range of motion.     Right lower leg: No edema.     Left lower leg: No edema.  Skin:    Comments: Gluteal cleft is normal with  no evidence of skin color change, no rash  Neurological:     Mental Status: She is alert and oriented to person, place, and time.  Psychiatric:        Mood and Affect: Mood normal.        Latest Ref Rng & Units 07/09/2022    2:38 PM 01/03/2022    2:21 PM 08/25/2020   10:29 AM  CMP  Glucose 70 - 99 mg/dL 74  92  73   BUN 6 - 20 mg/dL 7  9  7    Creatinine 0.57 - 1.00 mg/dL 0.45  4.09  8.11   Sodium 134 - 144 mmol/L 139  141  142   Potassium 3.5 - 5.2 mmol/L 4.1  3.8  4.2   Chloride 96 - 106 mmol/L 105  107  108   CO2 20 - 29 mmol/L 21  21  21    Calcium 8.7 - 10.2 mg/dL 9.0  9.1  8.6   Total Protein 6.0 - 8.5 g/dL  6.9  6.4   Total Bilirubin 0.0 - 1.2 mg/dL  <9.1  <4.7   Alkaline Phos 44 - 121 IU/L  66  69   AST 0 - 40 IU/L  17  14   ALT 0 - 32 IU/L  11  9     Lipid Panel     Component Value Date/Time   CHOL 113 01/03/2022 1421   TRIG 37 01/03/2022 1421   HDL 57 01/03/2022 1421   CHOLHDL 2.0 01/03/2022 1421   CHOLHDL 3.2 Ratio 10/14/2009 2108   VLDL 9 10/14/2009 2108   LDLCALC 46 01/03/2022 1421    CBC    Component Value Date/Time   WBC 9.4 01/03/2022 1421   WBC 9.2 05/18/2019 2021   RBC 4.03 01/03/2022 1421   RBC 4.02 05/18/2019 2021   HGB 11.8 01/03/2022 1421   HCT 35.5 01/03/2022 1421   PLT 181 01/03/2022 1421   MCV 88 01/03/2022 1421   MCH 29.3 01/03/2022 1421   MCH 29.4 05/18/2019 2021   MCHC 33.2 01/03/2022 1421   MCHC 33.5 05/18/2019 2021   RDW 14.4 01/03/2022 1421   LYMPHSABS 3.7 (H) 01/03/2022 1421   MONOABS 0.4 04/08/2018 1521   EOSABS 0.0 01/03/2022 1421   BASOSABS 0.0 01/03/2022 1421    Lab Results  Component Value Date   HGBA1C 5.6 08/25/2020    Assessment & Plan:  1. Essential  hypertension Controlled Counseled on blood pressure goal of less than 130/80, low-sodium, DASH diet, medication compliance, 150 minutes of moderate intensity exercise per week. Discussed medication compliance, adverse effects. - amLODipine (NORVASC) 5 MG tablet; Take 1 tablet (5 mg total) by mouth daily.  Dispense: 90 tablet; Refill: 1 - Hemoglobin A1c - CBC with Differential/Platelet  2. Screening for STD (sexually transmitted disease) - Cervicovaginal ancillary only - HIV Antibody (routine testing w rflx) - RPR w/reflex to TrepSure  3. Sinus pressure Stable - fluticasone (FLONASE) 50 MCG/ACT nasal spray; Place 2 sprays into both nostrils daily.  Dispense: 16 g; Refill: 6  4. Perianal itch Likely due to irritation from persistent drainage - clotrimazole (LOTRIMIN) 1 % cream; Apply 1 Application topically 2 (two) times daily.  Dispense: 30 g; Refill: 0  5. Screening for diabetes mellitus A1c ordered - CMP14+EGFR   Health Care Maintenance: Last Pap smear from 12/2020 was normal Meds ordered this encounter  Medications   amLODipine (NORVASC) 5 MG tablet    Sig: Take 1 tablet (5 mg total) by  mouth daily.    Dispense:  90 tablet    Refill:  1    Dose decreased   fluticasone (FLONASE) 50 MCG/ACT nasal spray    Sig: Place 2 sprays into both nostrils daily.    Dispense:  16 g    Refill:  6   clotrimazole (LOTRIMIN) 1 % cream    Sig: Apply 1 Application topically 2 (two) times daily.    Dispense:  30 g    Refill:  0    Follow-up: Return in about 6 months (around 07/17/2023) for Chronic medical conditions.       Hoy Register, MD, FAAFP. [facility] [place], [place] [phone]   01/15/2023, 10:32 AM

## 2023-01-15 NOTE — Patient Instructions (Signed)
Exercising to Stay Healthy To become healthy and stay healthy, it is recommended that you do moderate-intensity and vigorous-intensity exercise. You can tell that you are exercising at a moderate intensity if your heart starts beating faster and you start breathing faster but can still hold a conversation. You can tell that you are exercising at a vigorous intensity if you are breathing much harder and faster and cannot hold a conversation while exercising. How can exercise benefit me? Exercising regularly is important. It has many health benefits, such as: Improving overall fitness, flexibility, and endurance. Increasing bone density. Helping with weight control. Decreasing body fat. Increasing muscle strength and endurance. Reducing stress and tension, anxiety, depression, or anger. Improving overall health. What guidelines should I follow while exercising? Before you start a new exercise program, talk with your health care provider. Do not exercise so much that you hurt yourself, feel dizzy, or get very short of breath. Wear comfortable clothes and wear shoes with good support. Drink plenty of water while you exercise to prevent dehydration or heat stroke. Work out until your breathing and your heartbeat get faster (moderate intensity). How often should I exercise? Choose an activity that you enjoy, and set realistic goals. Your health care provider can help you make an activity plan that is individually designed and works best for you. Exercise regularly as told by your health care provider. This may include: Doing strength training two times a week, such as: Lifting weights. Using resistance bands. Push-ups. Sit-ups. Yoga. Doing a certain intensity of exercise for a given amount of time. Choose from these options: A total of 150 minutes of moderate-intensity exercise every week. A total of 75 minutes of vigorous-intensity exercise every week. A mix of moderate-intensity and  vigorous-intensity exercise every week. Children, pregnant women, people who have not exercised regularly, people who are overweight, and older adults may need to talk with a health care provider about what activities are safe to perform. If you have a medical condition, be sure to talk with your health care provider before you start a new exercise program. What are some exercise ideas? Moderate-intensity exercise ideas include: Walking 1 mile (1.6 km) in about 15 minutes. Biking. Hiking. Golfing. Dancing. Water aerobics. Vigorous-intensity exercise ideas include: Walking 4.5 miles (7.2 km) or more in about 1 hour. Jogging or running 5 miles (8 km) in about 1 hour. Biking 10 miles (16.1 km) or more in about 1 hour. Lap swimming. Roller-skating or in-line skating. Cross-country skiing. Vigorous competitive sports, such as football, basketball, and soccer. Jumping rope. Aerobic dancing. What are some everyday activities that can help me get exercise? Yard work, such as: Pushing a lawn mower. Raking and bagging leaves. Washing your car. Pushing a stroller. Shoveling snow. Gardening. Washing windows or floors. How can I be more active in my day-to-day activities? Use stairs instead of an elevator. Take a walk during your lunch break. If you drive, park your car farther away from your work or school. If you take public transportation, get off one stop early and walk the rest of the way. Stand up or walk around during all of your indoor phone calls. Get up, stretch, and walk around every 30 minutes throughout the day. Enjoy exercise with a friend. Support to continue exercising will help you keep a regular routine of activity. Where to find more information You can find more information about exercising to stay healthy from: U.S. Department of Health and Human Services: www.hhs.gov Centers for Disease Control and Prevention (  CDC): www.cdc.gov Summary Exercising regularly is  important. It will improve your overall fitness, flexibility, and endurance. Regular exercise will also improve your overall health. It can help you control your weight, reduce stress, and improve your bone density. Do not exercise so much that you hurt yourself, feel dizzy, or get very short of breath. Before you start a new exercise program, talk with your health care provider. This information is not intended to replace advice given to you by your health care provider. Make sure you discuss any questions you have with your health care provider. Document Revised: 01/20/2021 Document Reviewed: 01/20/2021 Elsevier Patient Education  2023 [employer] Inc.  

## 2023-01-16 LAB — CBC WITH DIFFERENTIAL/PLATELET
Basophils Absolute: 0 10*3/uL (ref 0.0–0.2)
Basos: 0 %
EOS (ABSOLUTE): 0 10*3/uL (ref 0.0–0.4)
Eos: 1 %
Hematocrit: 35.8 % (ref 34.0–46.6)
Hemoglobin: 12 g/dL (ref 11.1–15.9)
Immature Grans (Abs): 0 10*3/uL (ref 0.0–0.1)
Immature Granulocytes: 0 %
Lymphocytes Absolute: 3.9 10*3/uL — ABNORMAL HIGH (ref 0.7–3.1)
Lymphs: 44 %
MCH: 29.6 pg (ref 26.6–33.0)
MCHC: 33.5 g/dL (ref 31.5–35.7)
MCV: 88 fL (ref 79–97)
Monocytes Absolute: 0.7 10*3/uL (ref 0.1–0.9)
Monocytes: 8 %
Neutrophils Absolute: 4.1 10*3/uL (ref 1.4–7.0)
Neutrophils: 47 %
Platelets: 191 10*3/uL (ref 150–450)
RBC: 4.06 x10E6/uL (ref 3.77–5.28)
RDW: 13.5 % (ref 11.7–15.4)
WBC: 8.8 10*3/uL (ref 3.4–10.8)

## 2023-01-16 LAB — CMP14+EGFR
ALT: 10 IU/L (ref 0–32)
AST: 20 IU/L (ref 0–40)
Albumin/Globulin Ratio: 1.4 (ref 1.2–2.2)
Albumin: 4.2 g/dL (ref 3.9–4.9)
Alkaline Phosphatase: 76 IU/L (ref 44–121)
BUN/Creatinine Ratio: 9 (ref 9–23)
BUN: 8 mg/dL (ref 6–20)
Bilirubin Total: 0.2 mg/dL (ref 0.0–1.2)
CO2: 21 mmol/L (ref 20–29)
Calcium: 9 mg/dL (ref 8.7–10.2)
Chloride: 104 mmol/L (ref 96–106)
Creatinine, Ser: 0.92 mg/dL (ref 0.57–1.00)
Globulin, Total: 3 g/dL (ref 1.5–4.5)
Glucose: 90 mg/dL (ref 70–99)
Potassium: 3.8 mmol/L (ref 3.5–5.2)
Sodium: 141 mmol/L (ref 134–144)
Total Protein: 7.2 g/dL (ref 6.0–8.5)
eGFR: 83 mL/min/{1.73_m2} (ref >59)

## 2023-01-16 LAB — CERVICOVAGINAL ANCILLARY ONLY
Bacterial Vaginitis (gardnerella): POSITIVE — AB
Chlamydia: NEGATIVE
Comment: NEGATIVE
Comment: NEGATIVE
Comment: NEGATIVE
Comment: NORMAL
Neisseria Gonorrhea: NEGATIVE
Trichomonas: NEGATIVE

## 2023-01-16 LAB — T PALLIDUM ANTIBODY, EIA: T pallidum Antibody, EIA: NEGATIVE

## 2023-01-16 LAB — HEMOGLOBIN A1C
Est. average glucose Bld gHb Est-mCnc: 117 mg/dL
Hgb A1c MFr Bld: 5.7 % — ABNORMAL HIGH (ref 4.8–5.6)

## 2023-01-16 LAB — RPR W/REFLEX TO TREPSURE: RPR: NONREACTIVE

## 2023-01-16 LAB — HIV ANTIBODY (ROUTINE TESTING W REFLEX): HIV Screen 4th Generation wRfx: NONREACTIVE

## 2023-01-17 ENCOUNTER — Other Ambulatory Visit: Payer: Self-pay | Admitting: Family Medicine

## 2023-01-17 ENCOUNTER — Other Ambulatory Visit: Payer: Self-pay

## 2023-01-17 MED ORDER — METRONIDAZOLE 500 MG PO TABS
500.0000 mg | ORAL_TABLET | Freq: Two times a day (BID) | ORAL | 0 refills | Status: AC
  Filled 2023-01-17: qty 14, 7d supply, fill #0

## 2023-03-15 ENCOUNTER — Other Ambulatory Visit: Payer: Self-pay

## 2023-03-15 ENCOUNTER — Other Ambulatory Visit (HOSPITAL_COMMUNITY): Payer: Self-pay

## 2023-03-18 ENCOUNTER — Other Ambulatory Visit: Payer: Self-pay

## 2023-04-24 ENCOUNTER — Encounter: Payer: Self-pay | Admitting: Family Medicine

## 2023-04-24 ENCOUNTER — Other Ambulatory Visit: Payer: Self-pay

## 2023-04-24 ENCOUNTER — Other Ambulatory Visit (HOSPITAL_COMMUNITY)
Admission: RE | Admit: 2023-04-24 | Discharge: 2023-04-24 | Disposition: A | Discharge status: Home / Self Care | Payer: Medicaid Other | Source: Ambulatory Visit | Attending: Family Medicine | Admitting: Family Medicine

## 2023-04-24 ENCOUNTER — Ambulatory Visit: Payer: Medicaid Other | Attending: Family Medicine | Admitting: Family Medicine

## 2023-04-24 VITALS — BP 135/88 | HR 78 | Temp 98.2°F | Ht 67.0 in | Wt 129.8 lb

## 2023-04-24 DIAGNOSIS — B3731 Acute candidiasis of vulva and vagina: Secondary | ICD-10-CM | POA: Insufficient documentation

## 2023-04-24 MED ORDER — FLUCONAZOLE 150 MG PO TABS
150.0000 mg | ORAL_TABLET | Freq: Once | ORAL | 6 refills | Status: AC
  Filled 2023-04-24: qty 2, 3d supply, fill #0

## 2023-04-24 NOTE — Progress Notes (Signed)
Vaginal discharge before and after period

## 2023-04-24 NOTE — Progress Notes (Signed)
Subjective:  Patient ID: Kelsey Joyce, female    DOB: [birth year]  Age: 37 y.o. MRN: [id number]  CC: Vaginal Discharge   HPI Kelsey Joyce is a 37 y.o. year old female with a history of hypertension here for an office visit.  Interval History: Discussed the use of AI scribe software for clinical note transcription with the patient, who gave verbal consent to proceed.   The patient presents with recurrent vaginal discharge, which she describes as white, similar to cheese, and with a bad smell. The discharge is not associated with sexual intercourse, as the patient has not been sexually active since her last visit in April. The discharge seems to occur before and after her menstrual period, and has been ongoing for about two weeks this time. However, the patient reports similar symptoms in the past, but it has been a few years since she last brought it to medical attention. The patient denies any associated itching or burning.  She has no urinary symptoms.       Past Medical History:  Diagnosis Date   Anxiety    Hypertension     Past Surgical History:  Procedure Laterality Date   COLPOSCOPY     LEEP     ROBOTIC ASSISTED LAPAROSCOPIC CHOLECYSTECTOMY-MULTI SITE N/A 09/09/2018   Procedure: ROBOTIC ASSISTED LAPAROSCOPIC CHOLECYSTECTOMY-MULTI SITE;  Surgeon: Leafy Ro, MD;  Location: [facility] OR[facility];  Service: General;  Laterality: N/A;   UMBILICAL HERNIA REPAIR N/A 09/09/2018   Procedure: HERNIA REPAIR UMBILICAL ADULT;  Surgeon: Leafy Ro, MD;  Location: [facility];  Service: General;  Laterality: N/A;    Family History  Problem Relation Age of Onset   Hypertension Mother    Cervical cancer Other 39       Grandmother   Hypertension Paternal Aunt    Breast cancer Other 70       Aunt   Breast cancer Other        Great Aunt    Cancer Maternal Grandmother        cerival    Social History   Socioeconomic History   Marital status: Single    Spouse name: Not on file    Number of children: Not on file   Years of education: Not on file   Highest education level: Some college, no degree  Occupational History   Not on file  Tobacco Use   Smoking status: Former    Current packs/day: 0.20    Average packs/day: 0.2 packs/day for 5.0 years (1.0 ttl pk-yrs)    Types: Cigarettes   Smokeless tobacco: Former    Quit date: 04/23/2018   Tobacco comments:    smoking 1-2 cigs per day  Vaping Use   Vaping status: Never Used  Substance and Sexual Activity   Alcohol use: Yes    Alcohol/week: 0.0 standard drinks of alcohol    Comment: occassionally   Drug use: Not Currently    Types: Marijuana   Sexual activity: Yes    Birth control/protection: Condom  Other Topics Concern   Not on file  Social History Narrative   Not on file   Social Determinants of Health   Financial Resource Strain: Low Risk  (01/15/2023)   Overall Financial Resource Strain (CARDIA)    Difficulty of Paying Living Expenses: Not very hard  Food Insecurity: Unknown (01/15/2023)   Hunger Vital Sign    Worried About Running Out of Food in the Last Year: Never true    Ran Out of Food in  the Last Year: Patient declined  Transportation Needs: No Transportation Needs (01/15/2023)   PRAPARE - Administrator, Civil Service (Medical): No    Lack of Transportation (Non-Medical): No  Physical Activity: Insufficiently Active (01/15/2023)   Exercise Vital Sign    Days of Exercise per Week: 3 days    Minutes of Exercise per Session: 20 min  Stress: No Stress Concern Present (01/15/2023)   [employer] of Occupational Health - Occupational Stress Questionnaire    Feeling of Stress : Only a little  Social Connections: Unknown (01/15/2023)   Social Connection and Isolation Panel [NHANES]    Frequency of Communication with Friends and Family: Patient declined    Frequency of Social Gatherings with Friends and Family: Patient declined    Attends Religious Services: Patient declined    Automotive engineer or Organizations: No    Attends Engineer, structural: Not on file    Marital Status: Never married    Allergies  Allergen Reactions   Acetaminophen Nausea Only   Asa [Aspirin] Nausea Only    Outpatient Medications Prior to Visit  Medication Sig Dispense Refill   amLODipine (NORVASC) 5 MG tablet Take 1 tablet (5 mg total) by mouth daily. 90 tablet 1   clotrimazole (LOTRIMIN) 1 % cream Apply 1 Application topically 2 (two) times daily. 30 g 0   fluticasone (FLONASE) 50 MCG/ACT nasal spray Place 2 sprays into both nostrils daily. 16 g 6   tiZANidine (ZANAFLEX) 4 MG tablet Take 1 tablet (4 mg total) by mouth every 8 (eight) hours as needed. 30 tablet 0   ibuprofen (ADVIL,MOTRIN) 200 MG tablet Take 400 mg by mouth daily as needed for moderate pain. (Patient not taking: Reported on 07/09/2022)     No facility-administered medications prior to visit.     ROS Review of Systems  Constitutional:  Negative for activity change and appetite change.  HENT:  Negative for sinus pressure and sore throat.   Respiratory:  Negative for chest tightness, shortness of breath and wheezing.   Cardiovascular:  Negative for chest pain and palpitations.  Gastrointestinal:  Negative for abdominal distention, abdominal pain and constipation.  Genitourinary: Negative.   Musculoskeletal: Negative.   Psychiatric/Behavioral:  Negative for behavioral problems and dysphoric mood.     Objective:  BP 135/88   Pulse 78   Temp 98.2 F (36.8 C) (Oral)   Ht 5\' 7"  (1.702 m)   Wt 129 lb 12.8 oz (58.9 kg)   SpO2 100%   BMI 20.33 kg/m      04/24/2023    9:47 AM 01/15/2023    9:43 AM 01/15/2023    9:06 AM  BP/Weight  Systolic BP 135 134 129  Diastolic BP 88 88 90  Wt. (Lbs) 129.8  132  BMI 20.33 kg/m2  23.38 kg/m2      Physical Exam Constitutional:      Appearance: She is well-developed.  Cardiovascular:     Rate and Rhythm: Normal rate.     Heart sounds: Normal heart sounds. No  murmur heard. Pulmonary:     Effort: Pulmonary effort is normal.     Breath sounds: Normal breath sounds. No wheezing or rales.  Chest:     Chest wall: No tenderness.  Abdominal:     General: Bowel sounds are normal. There is no distension.     Palpations: Abdomen is soft. There is no mass.     Tenderness: There is no abdominal tenderness.  Musculoskeletal:  General: Normal range of motion.     Right lower leg: No edema.     Left lower leg: No edema.  Neurological:     Mental Status: She is alert and oriented to person, place, and time.  Psychiatric:        Mood and Affect: Mood normal.        Latest Ref Rng & Units 01/15/2023    2:34 PM 07/09/2022    2:38 PM 01/03/2022    2:21 PM  CMP  Glucose 70 - 99 mg/dL 90  74  92   BUN 6 - 20 mg/dL 8  7  9    Creatinine 0.57 - 1.00 mg/dL 5.28  4.13  2.44   Sodium 134 - 144 mmol/L 141  139  141   Potassium 3.5 - 5.2 mmol/L 3.8  4.1  3.8   Chloride 96 - 106 mmol/L 104  105  107   CO2 20 - 29 mmol/L 21  21  21    Calcium 8.7 - 10.2 mg/dL 9.0  9.0  9.1   Total Protein 6.0 - 8.5 g/dL 7.2   6.9   Total Bilirubin 0.0 - 1.2 mg/dL 0.2   <0.1   Alkaline Phos 44 - 121 IU/L 76   66   AST 0 - 40 IU/L 20   17   ALT 0 - 32 IU/L 10   11     Lipid Panel     Component Value Date/Time   CHOL 113 01/03/2022 1421   TRIG 37 01/03/2022 1421   HDL 57 01/03/2022 1421   CHOLHDL 2.0 01/03/2022 1421   CHOLHDL 3.2 Ratio 10/14/2009 2108   VLDL 9 10/14/2009 2108   LDLCALC 46 01/03/2022 1421    CBC    Component Value Date/Time   WBC 8.8 01/15/2023 1434   WBC 9.2 05/18/2019 2021   RBC 4.06 01/15/2023 1434   RBC 4.02 05/18/2019 2021   HGB 12.0 01/15/2023 1434   HCT 35.8 01/15/2023 1434   PLT 191 01/15/2023 1434   MCV 88 01/15/2023 1434   MCH 29.6 01/15/2023 1434   MCH 29.4 05/18/2019 2021   MCHC 33.5 01/15/2023 1434   MCHC 33.5 05/18/2019 2021   RDW 13.5 01/15/2023 1434   LYMPHSABS 3.9 (H) 01/15/2023 1434   MONOABS 0.4 04/08/2018 1521    EOSABS 0.0 01/15/2023 1434   BASOSABS 0.0 01/15/2023 1434    Lab Results  Component Value Date   HGBA1C 5.7 (H) 01/15/2023    Assessment & Plan:      Recurrent Vaginal Yeast Infections: Recurrent symptoms of vaginal discharge, described as white and cheese-like, with associated odor, particularly around the time of menstruation. No associated itching, burning, or urinary symptoms. -Obtain vaginal swab for further testing. -Initiate treatment for yeast infection and provide prophylactic Diflucan to be taken monthly before menstruation for a period of six months. -If swab results indicate bacterial vaginosis, an additional prescription will be provided.           Meds ordered this encounter  Medications   fluconazole (DIFLUCAN) 150 MG tablet    Sig: Take 1 tablet (150 mg total) by mouth once for 1 dose. Then repeat in 72 hours.  Take once a month after that prior to periods.    Dispense:  2 tablet    Refill:  6    Follow-up: No follow-ups on file.       Hoy Register, MD, FAAFP. [facility] and [facility] [place], [place] [phone]  04/24/2023, 10:10 AM

## 2023-04-24 NOTE — Patient Instructions (Signed)

## 2023-04-25 LAB — CERVICOVAGINAL ANCILLARY ONLY
Bacterial Vaginitis (gardnerella): POSITIVE — AB
Candida Glabrata: NEGATIVE
Candida Vaginitis: NEGATIVE
Chlamydia: NEGATIVE
Comment: NEGATIVE
Comment: NEGATIVE
Comment: NEGATIVE
Comment: NEGATIVE
Comment: NEGATIVE
Comment: NORMAL
Neisseria Gonorrhea: NEGATIVE
Trichomonas: NEGATIVE

## 2023-04-26 ENCOUNTER — Encounter: Payer: Self-pay | Admitting: Family Medicine

## 2023-04-26 ENCOUNTER — Other Ambulatory Visit: Payer: Self-pay

## 2023-04-26 MED ORDER — METRONIDAZOLE 500 MG PO TABS
500.0000 mg | ORAL_TABLET | Freq: Two times a day (BID) | ORAL | 0 refills | Status: AC
  Filled 2023-04-26: qty 14, 7d supply, fill #0

## 2023-04-29 ENCOUNTER — Other Ambulatory Visit: Payer: Self-pay

## 2023-07-04 ENCOUNTER — Other Ambulatory Visit: Payer: Self-pay

## 2023-07-04 ENCOUNTER — Other Ambulatory Visit (HOSPITAL_COMMUNITY): Payer: Self-pay

## 2023-07-05 ENCOUNTER — Other Ambulatory Visit: Payer: Self-pay

## 2023-07-08 ENCOUNTER — Other Ambulatory Visit (HOSPITAL_COMMUNITY): Payer: Self-pay

## 2023-07-18 ENCOUNTER — Other Ambulatory Visit: Payer: Self-pay

## 2023-07-18 ENCOUNTER — Ambulatory Visit: Payer: Self-pay | Attending: Family Medicine | Admitting: Family Medicine

## 2023-07-18 ENCOUNTER — Encounter: Payer: Self-pay | Admitting: Family Medicine

## 2023-07-18 VITALS — BP 134/89 | HR 81 | Ht 67.0 in | Wt 129.4 lb

## 2023-07-18 DIAGNOSIS — Z23 Encounter for immunization: Secondary | ICD-10-CM

## 2023-07-18 DIAGNOSIS — B3732 Chronic candidiasis of vulva and vagina: Secondary | ICD-10-CM | POA: Insufficient documentation

## 2023-07-18 DIAGNOSIS — I10 Essential (primary) hypertension: Secondary | ICD-10-CM

## 2023-07-18 DIAGNOSIS — L299 Pruritus, unspecified: Secondary | ICD-10-CM

## 2023-07-18 DIAGNOSIS — L2989 Other pruritus: Secondary | ICD-10-CM | POA: Insufficient documentation

## 2023-07-18 DIAGNOSIS — R7303 Prediabetes: Secondary | ICD-10-CM

## 2023-07-18 DIAGNOSIS — B3731 Acute candidiasis of vulva and vagina: Secondary | ICD-10-CM

## 2023-07-18 LAB — POCT GLYCOSYLATED HEMOGLOBIN (HGB A1C): HbA1c, POC (prediabetic range): 5.7 % (ref 5.7–6.4)

## 2023-07-18 MED ORDER — AMLODIPINE BESYLATE 5 MG PO TABS
5.0000 mg | ORAL_TABLET | Freq: Every day | ORAL | 1 refills | Status: DC
  Filled 2023-07-18 – 2023-10-04 (×2): qty 90, 90d supply, fill #0
  Filled 2024-01-09: qty 90, 90d supply, fill #1

## 2023-07-18 MED ORDER — FLUCONAZOLE 150 MG PO TABS
150.0000 mg | ORAL_TABLET | Freq: Every day | ORAL | 1 refills | Status: DC | PRN
  Filled 2023-07-18 (×2): qty 15, 15d supply, fill #0

## 2023-07-18 NOTE — Patient Instructions (Signed)

## 2023-07-18 NOTE — Progress Notes (Signed)
Subjective:  Patient ID: Kelsey Joyce, female    DOB: 1986/10/07  Age: 37 y.o. MRN: [id number]  CC: Medical Management of Chronic Issues (Ears itching)   HPI Kelsey Joyce is a 37 y.o. year old female with a history of hypertension here for an office visit.   Interval History: Discussed the use of AI scribe software for clinical note transcription with the patient, who gave verbal consent to proceed.  The patient, with a history of hypertension managed on amlodipine, presents with chronic itching in her ears. She denies any hearing difficulty or sinus symptoms such as congestion. She has been using her nasal sprays as prescribed. She also reports occasional generalized skin itching, describing it as feeling like she is constantly running into a spider web. She denies any rash or skin changes.  In addition, the patient reports a recurrent issue with what she believes to be yeast infections around the time of her menstrual cycle. She was previously prescribed Diflucan (fluconazole) to take as needed around her cycle, but there seems to have been a misunderstanding about the refill instructions. The patient thought she was only given two pills total, when in fact she had multiple refills available to her. She plans to take the medication as directed moving forward.        Past Medical History:  Diagnosis Date   Anxiety    Hypertension     Past Surgical History:  Procedure Laterality Date   COLPOSCOPY     LEEP     ROBOTIC ASSISTED LAPAROSCOPIC CHOLECYSTECTOMY-MULTI SITE N/A 09/09/2018   Procedure: ROBOTIC ASSISTED LAPAROSCOPIC CHOLECYSTECTOMY-MULTI SITE;  Surgeon: Leafy Ro, MD;  Location: [facility] OR[facility];  Service: General;  Laterality: N/A;   UMBILICAL HERNIA REPAIR N/A 09/09/2018   Procedure: HERNIA REPAIR UMBILICAL ADULT;  Surgeon: Leafy Ro, MD;  Location: [facility];  Service: General;  Laterality: N/A;    Family History  Problem Relation Age of Onset   Hypertension  Mother    Cervical cancer Other 11       Grandmother   Hypertension Paternal Aunt    Breast cancer Other 49       Aunt   Breast cancer Other        Great Aunt    Cancer Maternal Grandmother        cerival    Social History   Socioeconomic History   Marital status: Single    Spouse name: Not on file   Number of children: Not on file   Years of education: Not on file   Highest education level: Some college, no degree  Occupational History   Not on file  Tobacco Use   Smoking status: Former    Current packs/day: 0.20    Average packs/day: 0.2 packs/day for 5.0 years (1.0 ttl pk-yrs)    Types: Cigarettes   Smokeless tobacco: Former    Quit date: 04/23/2018   Tobacco comments:    smoking 1-2 cigs per day  Vaping Use   Vaping status: Never Used  Substance and Sexual Activity   Alcohol use: Yes    Alcohol/week: 0.0 standard drinks of alcohol    Comment: occassionally   Drug use: Not Currently    Types: Marijuana   Sexual activity: Yes    Birth control/protection: Condom  Other Topics Concern   Not on file  Social History Narrative   Not on file   Social Determinants of Health   Financial Resource Strain: Medium Risk (07/18/2023)   Overall Financial  Resource Strain (CARDIA)    Difficulty of Paying Living Expenses: Somewhat hard  Food Insecurity: Food Insecurity Present (07/18/2023)   Hunger Vital Sign    Worried About Running Out of Food in the Last Year: Never true    Ran Out of Food in the Last Year: Sometimes true  Transportation Needs: No Transportation Needs (07/18/2023)   PRAPARE - Administrator, Civil Service (Medical): No    Lack of Transportation (Non-Medical): No  Physical Activity: Sufficiently Active (07/18/2023)   Exercise Vital Sign    Days of Exercise per Week: 7 days    Minutes of Exercise per Session: 30 min  Stress: No Stress Concern Present (07/18/2023)   [employer] of Occupational Health - Occupational Stress Questionnaire     Feeling of Stress : Only a little  Social Connections: Moderately Isolated (07/18/2023)   Social Connection and Isolation Panel [NHANES]    Frequency of Communication with Friends and Family: More than three times a week    Frequency of Social Gatherings with Friends and Family: More than three times a week    Attends Religious Services: Never    Database administrator or Organizations: No    Attends Engineer, structural: 1 to 4 times per year    Marital Status: Never married    Allergies  Allergen Reactions   Acetaminophen Nausea Only   Asa [Aspirin] Nausea Only    Outpatient Medications Prior to Visit  Medication Sig Dispense Refill   clotrimazole (LOTRIMIN) 1 % cream Apply 1 Application topically 2 (two) times daily. 30 g 0   fluticasone (FLONASE) 50 MCG/ACT nasal spray Place 2 sprays into both nostrils daily. 16 g 6   amLODipine (NORVASC) 5 MG tablet Take 1 tablet (5 mg total) by mouth daily. 90 tablet 1   ibuprofen (ADVIL,MOTRIN) 200 MG tablet Take 400 mg by mouth daily as needed for moderate pain. (Patient not taking: Reported on 07/09/2022)     tiZANidine (ZANAFLEX) 4 MG tablet Take 1 tablet (4 mg total) by mouth every 8 (eight) hours as needed. (Patient not taking: Reported on 07/18/2023) 30 tablet 0   No facility-administered medications prior to visit.     ROS Review of Systems  Constitutional:  Negative for activity change and appetite change.  HENT:  Negative for ear pain, sinus pressure and sore throat.   Respiratory:  Negative for chest tightness, shortness of breath and wheezing.   Cardiovascular:  Negative for chest pain and palpitations.  Gastrointestinal:  Negative for abdominal distention, abdominal pain and constipation.  Genitourinary: Negative.   Musculoskeletal: Negative.   Psychiatric/Behavioral:  Negative for behavioral problems and dysphoric mood.     Objective:  BP 134/89   Pulse 81   Ht 5\' 7"  (1.702 m)   Wt 129 lb 6.4 oz (58.7 kg)    SpO2 100%   BMI 20.27 kg/m      07/18/2023    9:37 AM 04/24/2023    9:47 AM 01/15/2023    9:43 AM  BP/Weight  Systolic BP 134 135 134  Diastolic BP 89 88 88  Wt. (Lbs) 129.4 129.8   BMI 20.27 kg/m2 20.33 kg/m2       Physical Exam Constitutional:      Appearance: She is well-developed.  HENT:     Right Ear: Tympanic membrane normal.     Left Ear: Tympanic membrane normal.  Cardiovascular:     Rate and Rhythm: Normal rate.     Heart sounds:  Normal heart sounds. No murmur heard. Pulmonary:     Effort: Pulmonary effort is normal.     Breath sounds: Normal breath sounds. No wheezing or rales.  Chest:     Chest wall: No tenderness.  Abdominal:     General: Bowel sounds are normal. There is no distension.     Palpations: Abdomen is soft. There is no mass.     Tenderness: There is no abdominal tenderness.  Musculoskeletal:        General: Normal range of motion.     Right lower leg: No edema.     Left lower leg: No edema.  Neurological:     Mental Status: She is alert and oriented to person, place, and time.  Psychiatric:        Mood and Affect: Mood normal.        Latest Ref Rng & Units 01/15/2023    2:34 PM 07/09/2022    2:38 PM 01/03/2022    2:21 PM  CMP  Glucose 70 - 99 mg/dL 90  74  92   BUN 6 - 20 mg/dL 8  7  9    Creatinine 0.57 - 1.00 mg/dL 8.65  7.84  6.96   Sodium 134 - 144 mmol/L 141  139  141   Potassium 3.5 - 5.2 mmol/L 3.8  4.1  3.8   Chloride 96 - 106 mmol/L 104  105  107   CO2 20 - 29 mmol/L 21  21  21    Calcium 8.7 - 10.2 mg/dL 9.0  9.0  9.1   Total Protein 6.0 - 8.5 g/dL 7.2   6.9   Total Bilirubin 0.0 - 1.2 mg/dL 0.2   <2.9   Alkaline Phos 44 - 121 IU/L 76   66   AST 0 - 40 IU/L 20   17   ALT 0 - 32 IU/L 10   11     Lipid Panel     Component Value Date/Time   CHOL 113 01/03/2022 1421   TRIG 37 01/03/2022 1421   HDL 57 01/03/2022 1421   CHOLHDL 2.0 01/03/2022 1421   CHOLHDL 3.2 Ratio 10/14/2009 2108   VLDL 9 10/14/2009 2108   LDLCALC 46  01/03/2022 1421    CBC    Component Value Date/Time   WBC 8.8 01/15/2023 1434   WBC 9.2 05/18/2019 2021   RBC 4.06 01/15/2023 1434   RBC 4.02 05/18/2019 2021   HGB 12.0 01/15/2023 1434   HCT 35.8 01/15/2023 1434   PLT 191 01/15/2023 1434   MCV 88 01/15/2023 1434   MCH 29.6 01/15/2023 1434   MCH 29.4 05/18/2019 2021   MCHC 33.5 01/15/2023 1434   MCHC 33.5 05/18/2019 2021   RDW 13.5 01/15/2023 1434   LYMPHSABS 3.9 (H) 01/15/2023 1434   MONOABS 0.4 04/08/2018 1521   EOSABS 0.0 01/15/2023 1434   BASOSABS 0.0 01/15/2023 1434    Lab Results  Component Value Date   HGBA1C 5.7 07/18/2023    Assessment & Plan:      Itchy Ears Chronic itching without pain, hearing loss, or sinus symptoms. No visible signs of wax buildup or psoriasis. -Consider trial of daily antihistamine (e.g., Zyrtec, loratadine) to manage itching. -No rash on her skin on exam ; dry skin/xerosis could be a cause.  Use moisturizers.  Recurrent genital candidiasis Reports of recurrent yeast infections around menstrual cycle. Misunderstanding about prescription for Diflucan (Fluconazole) leading to underutilization. -Clarified prescription instructions for Diflucan:take one tablet, repeat in 72 hours, then  once a month prior to periods. -Modify prescription to allow for larger quantity at once for convenience and adherence.  Prediabetes Labs reveal prediabetes with an A1c of 5.7.  Working on a low carbohydrate diet, exercise, weight loss is recommended in order to prevent progression to type 2 diabetes mellitus.  Hypertension -Control -Continue amlodipine -Counseled on blood pressure goal of less than 130/80, low-sodium, DASH diet, medication compliance, 150 minutes of moderate intensity exercise per week. Discussed medication compliance, adverse effects.   General Health Maintenance -Administer influenza vaccine today.          Meds ordered this encounter  Medications   amLODipine (NORVASC) 5 MG  tablet    Sig: Take 1 tablet (5 mg total) by mouth daily.    Dispense:  90 tablet    Refill:  1   fluconazole (DIFLUCAN) 150 MG tablet    Sig: Take 1 tablet (150 mg total) by mouth daily as needed (for vaginal candidiasis prior to periods).    Dispense:  15 tablet    Refill:  1    Follow-up: Return in about 6 months (around 01/16/2024) for Chronic medical conditions.       Hoy Register, MD, FAAFP. [facility] and [facility] [place], [place] [phone]   07/18/2023, 12:53 PM

## 2023-07-19 ENCOUNTER — Other Ambulatory Visit: Payer: Self-pay

## 2023-10-04 ENCOUNTER — Other Ambulatory Visit: Payer: Self-pay

## 2024-01-10 ENCOUNTER — Other Ambulatory Visit: Payer: Self-pay

## 2024-01-16 ENCOUNTER — Ambulatory Visit: Payer: Self-pay | Admitting: Family Medicine

## 2024-01-16 ENCOUNTER — Ambulatory Visit: Payer: Self-pay | Admitting: Physician Assistant

## 2024-03-05 ENCOUNTER — Other Ambulatory Visit: Payer: Self-pay

## 2024-03-05 ENCOUNTER — Encounter: Payer: Self-pay | Admitting: Internal Medicine

## 2024-03-05 ENCOUNTER — Ambulatory Visit: Payer: Self-pay | Admitting: Family Medicine

## 2024-03-05 ENCOUNTER — Ambulatory Visit: Payer: Self-pay | Attending: Internal Medicine | Admitting: Internal Medicine

## 2024-03-05 VITALS — BP 147/91 | HR 73 | Temp 98.2°F | Ht 67.0 in | Wt 136.0 lb

## 2024-03-05 DIAGNOSIS — B3731 Acute candidiasis of vulva and vagina: Secondary | ICD-10-CM

## 2024-03-05 DIAGNOSIS — R0981 Nasal congestion: Secondary | ICD-10-CM

## 2024-03-05 DIAGNOSIS — I10 Essential (primary) hypertension: Secondary | ICD-10-CM

## 2024-03-05 DIAGNOSIS — J3489 Other specified disorders of nose and nasal sinuses: Secondary | ICD-10-CM

## 2024-03-05 DIAGNOSIS — R7303 Prediabetes: Secondary | ICD-10-CM

## 2024-03-05 LAB — POCT GLYCOSYLATED HEMOGLOBIN (HGB A1C): HbA1c, POC (prediabetic range): 5.4 % — AB (ref 5.7–6.4)

## 2024-03-05 LAB — GLUCOSE, POCT (MANUAL RESULT ENTRY): POC Glucose: 92 mg/dL (ref 70–99)

## 2024-03-05 MED ORDER — FLUTICASONE PROPIONATE 50 MCG/ACT NA SUSP
2.0000 | Freq: Every day | NASAL | 6 refills | Status: DC
  Filled 2024-03-05: qty 16, 30d supply, fill #0
  Filled 2024-09-17: qty 16, 30d supply, fill #1

## 2024-03-05 MED ORDER — AMLODIPINE BESYLATE 10 MG PO TABS
10.0000 mg | ORAL_TABLET | Freq: Every day | ORAL | 1 refills | Status: DC
  Filled 2024-03-05: qty 90, 90d supply, fill #0
  Filled 2024-06-24: qty 90, 90d supply, fill #1

## 2024-03-05 MED ORDER — FLUCONAZOLE 150 MG PO TABS
150.0000 mg | ORAL_TABLET | Freq: Every day | ORAL | 1 refills | Status: AC | PRN
  Filled 2024-03-05: qty 6, 84d supply, fill #0
  Filled 2024-06-24: qty 6, 84d supply, fill #1
  Filled 2024-09-17: qty 6, 84d supply, fill #2

## 2024-03-05 NOTE — Patient Instructions (Signed)
 VISIT SUMMARY:  Today, we discussed your blood pressure management and follow-up for your hypertension and prediabetes. We also addressed your allergic rhinitis and recurrent vaginal candidiasis.  YOUR PLAN:  -HYPERTENSION: Hypertension means high blood pressure. Your blood pressure readings have been elevated despite your current medication. We will increase your amlodipine  dose to 10 mg daily. Please check your blood pressure weekly for the next 2-3 weeks and report if your systolic blood pressure drops below 100 mmHg. A 90-day prescription for amlodipine  has been sent to your pharmacy. Follow up with the clinical pharmacist in 6-8 weeks and schedule a follow-up appointment with Dr. Veronica Gordon in 6 months.  -PREDIABETES: Prediabetes means your blood sugar levels are higher than normal but not high enough to be classified as diabetes. Your recent A1c level is 5.4, which is within the normal range. Continue your good dietary habits and regular exercise. Try to further reduce your intake of sugary drinks and consider switching to zero-calorie flavored water.  -Nasal congestion: A refill for Flonase  has been sent to your pharmacy to help manage your symptoms.  -RECURRENT VAGINAL CANDIDIASIS: Recurrent vaginal candidiasis is a yeast infection that occurs frequently, especially before your menstrual period. A refill for Diflucan  has been sent to your pharmacy to help manage this condition.  INSTRUCTIONS:  Please follow up with the clinical pharmacist in 6-8 weeks for your hypertension management. Additionally, schedule a follow-up appointment with Dr. Veronica Gordon in 6 months.

## 2024-03-05 NOTE — Progress Notes (Signed)
 Patient ID: Kelsey Joyce, female    DOB: [birth year]  MRN: [id number]  CC: Hypertension (HTN & pre-diabetes. /No questions / concerns/Yes to pap for another appt)   Subjective: Kelsey Joyce is a 38 y.o. female who presents for chronic ds management. PCP is Dr. Newlin. Last seen 07/2023 Her concerns today include:   Discussed the use of AI scribe software for clinical note transcription with the patient, who gave verbal consent to proceed.  History of Present Illness Kelsey Joyce is a 38 year old female with hypertension and prediabetes who presents for blood pressure management and follow-up.  She takes amlodipine  5 mg daily and monitors her blood pressure two to three times a week. Home readings typically show a systolic pressure around 120 mmHg, with occasional diastolic elevations to 95-96 mmHg. She experiences a tension feeling in the frontal head area when blood pressure is elevated. There is no chest pain, shortness of breath, or leg swelling. She limits salt intake, using Mrs. Dash instead.  For prediabetes, she avoids junk food and sweets, drinks water and cranberry juice, and has reduced soda intake to a two-liter bottle every three days. 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 Her recent A1c was 5.4.  Results for orders placed or performed in visit on 03/05/24  POCT glucose (manual entry)   Collection Time: 03/05/24 11:08 AM  Result Value Ref Range   POC Glucose 92 70 - 99 mg/dl  POCT glycosylated hemoglobin (Hb A1C)   Collection Time: 03/05/24 11:49 AM  Result Value Ref Range   Hemoglobin A1C     HbA1c POC (<> result, manual entry)     HbA1c, POC (prediabetic range) 5.4 (A) 5.7 - 6.4 %   HbA1c, POC (controlled diabetic range)     Request refill on Diflucan  which she takes before her menstrual cycles.  She tends to get recurrent yeast infections around that time.  Also request refill on Flonase  nasal spray for nasal/sinus  congestion.   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 30 g 0   No current facility-administered medications on file prior to visit.    Allergies  Allergen Reactions   Acetaminophen  Nausea Only   Asa [Aspirin] Nausea Only    Social History   Socioeconomic History   Marital status: Single    Spouse name: Not on file   Number of children: Not on file   Years of education: Not on file   Highest education level: Some college, no degree  Occupational History   Not on file  Tobacco Use   Smoking status: Former    Current packs/day: 0.20    Average packs/day: 0.2 packs/day for 5.0 years (1.0 ttl pk-yrs)    Types: Cigarettes   Smokeless tobacco: Former    Quit date: 04/23/2018   Tobacco comments:    smoking 1-2 cigs per day  Vaping Use   Vaping status: Never Used  Substance and Sexual Activity   Alcohol use: Yes    Alcohol/week: 0.0 standard drinks of alcohol    Comment: occassionally   Drug use: Not Currently    Types: Marijuana   Sexual activity: Yes    Birth control/protection: Condom  Other Topics Concern   Not on file  Social History Narrative   Not on file   Social Drivers of Health   Financial Resource Strain: Medium Risk (03/01/2024)   Overall Financial Resource Strain (CARDIA)    Difficulty of Paying Living Expenses: Somewhat hard  Food  Insecurity: No Food Insecurity (03/01/2024)   Hunger Vital Sign    Worried About Running Out of Food in the Last Year: Never true    Ran Out of Food in the Last Year: Never true  Transportation Needs: No Transportation Needs (03/01/2024)   PRAPARE - Administrator, Civil Service (Medical): No    Lack of Transportation (Non-Medical): No  Physical Activity: Insufficiently Active (03/01/2024)   Exercise Vital Sign    Days of Exercise per Week: 2 days    Minutes of Exercise per Session: 20 min  Stress: No Stress Concern Present (03/01/2024)   [employer] of Occupational Health - Occupational Stress Questionnaire    Feeling of Stress : Not at all  Social Connections: Unknown (03/01/2024)   Social Connection and Isolation Panel [NHANES]    Frequency of Communication with Friends and Family: More than three times a week    Frequency of Social Gatherings with Friends and Family: Twice a week    Attends Religious Services: Patient declined    Database administrator or Organizations: No    Attends Banker Meetings: 1 to 4 times per year    Marital Status: Never married  Intimate Partner Violence: Not At Risk (07/18/2023)   Humiliation, Afraid, Rape, and Kick questionnaire    Fear of Current or Ex-Partner: No    Emotionally Abused: No    Physically Abused: No    Sexually Abused: No    Family History  Problem Relation Age of Onset   Hypertension Mother    Cervical cancer Other 38       Grandmother   Hypertension Paternal Aunt    Breast cancer Other 39       Aunt   Breast cancer Other        Great Aunt    Cancer Maternal Grandmother        cerival    Past Surgical History:  Procedure Laterality Date   COLPOSCOPY     LEEP     ROBOTIC ASSISTED LAPAROSCOPIC CHOLECYSTECTOMY-MULTI SITE N/A 09/09/2018   Procedure: ROBOTIC ASSISTED LAPAROSCOPIC CHOLECYSTECTOMY-MULTI SITE;  Surgeon: Alben Alma, MD;  Location: [facility];  Service: General;  Laterality: N/A;    UMBILICAL HERNIA REPAIR N/A 09/09/2018   Procedure: HERNIA REPAIR UMBILICAL ADULT;  Surgeon: Alben Alma, MD;  Location: [facility];  Service: General;  Laterality: N/A;    ROS: Review of Systems Negative except as stated above  PHYSICAL EXAM: BP (!) 147/91   Pulse 73   Temp 98.2 F (36.8 C) (Oral)   Ht 5\' 7"  (1.702 m)   Wt 136 lb (61.7 kg)   SpO2 100%   BMI 21.30 kg/m   Physical Exam  General appearance - alert, well appearing, and in no distress Mental status - normal mood, behavior, speech, dress, motor activity, and thought processes Neck - supple,  no significant adenopathy Chest - clear to auscultation, no wheezes, rales or rhonchi, symmetric air entry Heart - normal rate, regular rhythm, normal S1, S2, no murmurs, rubs, clicks or gallops Extremities - peripheral pulses normal, no pedal edema, no clubbing or cyanosis      Latest Ref Rng & Units 01/15/2023    2:34 PM 07/09/2022    2:38 PM 01/03/2022    2:21 PM  CMP  Glucose 70 - 99 mg/dL 90  74  92   BUN 6 - 20 mg/dL 8  7  9    Creatinine 0.57 - 1.00 mg/dL 5.40  9.81  1.91   Sodium 134 - 144 mmol/L 141  139  141   Potassium 3.5 - 5.2 mmol/L 3.8  4.1  3.8   Chloride 96 - 106 mmol/L 104  105  107   CO2 20 - 29 mmol/L 21  21  21    Calcium 8.7 - 10.2 mg/dL 9.0  9.0  9.1   Total Protein 6.0 - 8.5 g/dL 7.2   6.9   Total Bilirubin 0.0 - 1.2 mg/dL 0.2   <4.7   Alkaline Phos 44 - 121 IU/L 76   66   AST 0 - 40 IU/L 20   17   ALT 0 - 32 IU/L 10   11    Lipid Panel     Component Value Date/Time   CHOL 113 01/03/2022 1421   TRIG 37 01/03/2022 1421   HDL 57 01/03/2022 1421   CHOLHDL 2.0 01/03/2022 1421   CHOLHDL 3.2 Ratio 10/14/2009 2108   VLDL 9 10/14/2009 2108   LDLCALC 46 01/03/2022 1421    CBC    Component Value Date/Time   WBC 8.8 01/15/2023 1434   WBC 9.2 05/18/2019 2021   RBC 4.06 01/15/2023 1434   RBC 4.02 05/18/2019 2021   HGB 12.0 01/15/2023 1434   HCT 35.8 01/15/2023 1434   PLT 191 01/15/2023 1434    MCV 88 01/15/2023 1434   MCH 29.6 01/15/2023 1434   MCH 29.4 05/18/2019 2021   MCHC 33.5 01/15/2023 1434   MCHC 33.5 05/18/2019 2021   RDW 13.5 01/15/2023 1434   LYMPHSABS 3.9 (H) 01/15/2023 1434   MONOABS 0.4 04/08/2018 1521   EOSABS 0.0 01/15/2023 1434   BASOSABS 0.0 01/15/2023 1434    ASSESSMENT AND PLAN: Assessment & Plan Hypertension - Increase amlodipine  to 10 mg daily. - Check blood pressure weekly for 2-3 weeks. - Report if systolic BP <100 mmHg. - Send 90-day amlodipine  prescription to pharmacy. - Follow up with clinical pharmacist in 6-8 weeks for recheck. - Schedule follow-up with Dr. Newlin in 6 months.  Prediabetes A1c normalized at 5.4. Good dietary habits and regular exercise reported. - Encourage further reduction of sugary drinks. - Suggest zero-calorie flavored water.  Nasal congestion Requests refill of Flonase  for nasal congestion. - Send refill for Flonase  to pharmacy.  Recurrent vaginal candidiasis Recurrent premenstrual candidiasis managed with Diflucan . - Send refill for Diflucan  to pharmacy.      Patient was given the opportunity to ask questions.  Patient verbalized understanding of the plan and was able to repeat key elements of the plan.   This documentation was completed using Paediatric nurse.  Any transcriptional errors are unintentional.  Orders Placed This Encounter  Procedures   POCT glycosylated hemoglobin (Hb A1C)   POCT glucose (manual entry)     Requested Prescriptions   Signed Prescriptions Disp Refills   fluconazole  (DIFLUCAN ) 150 MG tablet 15  tablet 1    Sig: Take 1 tablet (150 mg total) by mouth daily as needed (for vaginal candidiasis prior to periods).   fluticasone  (FLONASE ) 50 MCG/ACT nasal spray 16 g 6    Sig: Place 2 sprays into both nostrils daily.   amLODipine  (NORVASC ) 10 MG tablet 90 tablet 1    Sig: Take 1 tablet (10 mg total) by mouth daily. Dose increase 03/05/24    Return in about 6  months (around 09/05/2024) for BP check Luke in 6 wks. give 6 mth f/u with Dr. Newlin.  Concetta Dee, MD, FACP

## 2024-03-06 ENCOUNTER — Other Ambulatory Visit: Payer: Self-pay

## 2024-04-13 ENCOUNTER — Telehealth: Payer: Self-pay | Admitting: Family Medicine

## 2024-04-13 NOTE — Telephone Encounter (Signed)
 Copied from CRM [id number]. Topic: Appointments - Scheduling Inquiry for Clinic >> Apr 13, 2024  2:28 PM Selinda RAMAN wrote:  Reason for CRM: The patient called in stating she thinks she needs to reschedule her appointment with Herlene that is scheduled for tomorrow and would like someone to call her.

## 2024-04-13 NOTE — Telephone Encounter (Signed)
 Called patient, verified name and date of birth, patient appointment has been rescheduled to 05/08/2024 at 1:30 pm. Patient acknowledged appointment.

## 2024-04-16 ENCOUNTER — Ambulatory Visit: Payer: Self-pay | Admitting: Pharmacist

## 2024-05-07 ENCOUNTER — Telehealth: Payer: Self-pay | Admitting: Family Medicine

## 2024-05-07 NOTE — Telephone Encounter (Signed)
 Called to confirm appt LVM 8/1

## 2024-05-08 ENCOUNTER — Ambulatory Visit: Payer: Self-pay | Admitting: Pharmacist

## 2024-06-24 ENCOUNTER — Other Ambulatory Visit: Payer: Self-pay

## 2024-06-25 ENCOUNTER — Other Ambulatory Visit: Payer: Self-pay

## 2024-06-26 ENCOUNTER — Other Ambulatory Visit: Payer: Self-pay

## 2024-09-07 ENCOUNTER — Ambulatory Visit: Payer: Self-pay | Admitting: Family Medicine

## 2024-09-17 ENCOUNTER — Other Ambulatory Visit: Payer: Self-pay | Admitting: Internal Medicine

## 2024-09-17 DIAGNOSIS — I10 Essential (primary) hypertension: Secondary | ICD-10-CM

## 2024-09-18 ENCOUNTER — Other Ambulatory Visit: Payer: Self-pay

## 2024-09-18 MED ORDER — AMLODIPINE BESYLATE 10 MG PO TABS
10.0000 mg | ORAL_TABLET | Freq: Every day | ORAL | 0 refills | Status: DC
  Filled 2024-09-18: qty 90, 90d supply, fill #0

## 2024-10-26 ENCOUNTER — Telehealth: Payer: Self-pay | Admitting: Family Medicine

## 2024-10-26 NOTE — Telephone Encounter (Signed)
 Pt confirmed appt

## 2024-10-27 ENCOUNTER — Ambulatory Visit: Payer: Self-pay | Attending: Family Medicine | Admitting: Family Medicine

## 2024-10-27 ENCOUNTER — Other Ambulatory Visit: Payer: Self-pay

## 2024-10-27 VITALS — BP 134/82 | HR 80 | Temp 98.4°F | Ht 67.0 in | Wt 137.0 lb

## 2024-10-27 DIAGNOSIS — Z23 Encounter for immunization: Secondary | ICD-10-CM

## 2024-10-27 DIAGNOSIS — I10 Essential (primary) hypertension: Secondary | ICD-10-CM

## 2024-10-27 DIAGNOSIS — R7303 Prediabetes: Secondary | ICD-10-CM

## 2024-10-27 DIAGNOSIS — J328 Other chronic sinusitis: Secondary | ICD-10-CM

## 2024-10-27 LAB — POCT GLYCOSYLATED HEMOGLOBIN (HGB A1C): HbA1c, POC (prediabetic range): 5.5 % — AB (ref 5.7–6.4)

## 2024-10-27 MED ORDER — AMLODIPINE BESYLATE 10 MG PO TABS
10.0000 mg | ORAL_TABLET | Freq: Every day | ORAL | 1 refills | Status: AC
  Filled 2024-10-27: qty 90, 90d supply, fill #0

## 2024-10-27 MED ORDER — FLUTICASONE PROPIONATE 50 MCG/ACT NA SUSP
2.0000 | Freq: Every day | NASAL | 6 refills | Status: AC
  Filled 2024-10-27: qty 16, 30d supply, fill #0

## 2024-10-27 NOTE — Patient Instructions (Signed)
 VISIT SUMMARY:  During your visit, we discussed your hypertension, anxiety, recent weight gain, and general health maintenance. Your blood pressure is well-controlled with your current medication. We also reviewed your glucose levels, which are normal. We administered a flu shot and refilled your prescriptions.  YOUR PLAN:  -ESSENTIAL HYPERTENSION: Essential hypertension means high blood pressure without a known secondary cause. Your blood pressure is well-controlled with your current medication, Amlodipine  10 mg daily. We will continue this medication and have ordered blood work to monitor your kidney and liver function.  -PREDIABETES: Prediabetes means your blood sugar levels are higher than normal but not high enough to be classified as diabetes. Your A1c levels are normal, indicating good glucose control. We have ordered blood work to monitor your glucose levels.  -GENERAL HEALTH MAINTENANCE: We discussed routine health maintenance. You received a flu shot, and we refilled your prescriptions for Amlodipine  and Fluticasone  nasal spray.  INSTRUCTIONS:  Please follow up with the blood work for kidney and liver function and glucose levels as ordered. Continue taking your medications as prescribed. If you experience any new symptoms or have concerns, please schedule an appointment.

## 2024-10-27 NOTE — Progress Notes (Signed)
 "  Subjective:  Patient ID: Kelsey Joyce, female    DOB: [birth year]  Age: 39 y.o. MRN: [id number]  CC: Medical Management of Chronic Issues (No questions )     Discussed the use of AI scribe software for clinical note transcription with the patient, who gave verbal consent to proceed.  History of Present Illness Kelsey Joyce is a 39 year old female with hypertension who presents for a routine follow-up visit.  She has anxiety that is worse when driving alone. She is not on medication for this. She has recent weight gain, which she associates with increased food intake and decreased stress after leaving a stressful job. 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  She takes amlodipine  10 mg daily for hypertension and uses Flonase . She is considering reapplying for insurance now that her employment has changed. She notes some discomfort in her left hand and wears a brace for relief.    Past Medical History:  Diagnosis Date   Anxiety    Hypertension     Past Surgical History:  Procedure Laterality Date   COLPOSCOPY     LEEP     ROBOTIC ASSISTED LAPAROSCOPIC CHOLECYSTECTOMY-MULTI SITE N/A 09/09/2018   Procedure: ROBOTIC ASSISTED LAPAROSCOPIC CHOLECYSTECTOMY-MULTI SITE;  Surgeon: Jordis Laneta FALCON, MD;  Location: [facility];  Service: General;  Laterality: N/A;   UMBILICAL HERNIA REPAIR N/A 09/09/2018   Procedure: HERNIA REPAIR UMBILICAL ADULT;  Surgeon: Jordis Laneta FALCON, MD;  Location: [facility];  Service: General;  Laterality: N/A;    Family History  Problem Relation Age of Onset   Hypertension Mother    Cervical cancer Other 63       Grandmother   Hypertension Paternal Aunt    Breast cancer Other 62       Aunt   Breast cancer Other        Great Aunt    Cancer Maternal Grandmother        cerival    Social History   Socioeconomic History   Marital status: Single    Spouse name: Not on file   Number of children: Not on file   Years of  education: Not on file   Highest education level: Some college, no degree  Occupational History   Not on file  Tobacco Use   Smoking status: Former    Current packs/day: 0.20    Average packs/day: 0.2 packs/day for 5.0 years (1.0 ttl pk-yrs)    Types: Cigarettes   Smokeless tobacco: Former    Quit date: 04/23/2018   Tobacco comments:    smoking 1-2 cigs per day  Vaping Use   Vaping status: Never Used  Substance and Sexual Activity   Alcohol use: Yes    Alcohol/week: 0.0 standard drinks of alcohol    Comment: occassionally   Drug use: Not Currently    Types: Marijuana   Sexual activity: Yes    Birth control/protection: Condom  Other Topics Concern   Not on file  Social History Narrative   Not on file   Social Drivers of Health   Tobacco Use: Medium Risk (03/05/2024)   Patient History    Smoking Tobacco Use: Former    Smokeless Tobacco Use: Former    Passive Exposure: Not on Actuary Strain: Medium Risk (10/27/2024)   Overall Financial Resource Strain (CARDIA)    Difficulty of Paying Living Expenses: Somewhat hard  Food Insecurity: No Food Insecurity (10/27/2024)   Epic  Worried About Programme Researcher, Broadcasting/film/video in the Last Year: Never true    Ran Out of Food in the Last Year: Never true  Transportation Needs: No Transportation Needs (10/27/2024)   Epic    Lack of Transportation (Medical): No    Lack of Transportation (Non-Medical): No  Physical Activity: Insufficiently Active (03/01/2024)   Exercise Vital Sign    Days of Exercise per Week: 2 days    Minutes of Exercise per Session: 20 min  Stress: No Stress Concern Present (10/27/2024)   [employer] of Occupational Health - Occupational Stress Questionnaire    Feeling of Stress: Not at all  Social Connections: Socially Isolated (10/27/2024)   Social Connection and Isolation Panel    Frequency of Communication with Friends and Family: More than three times a week    Frequency of Social Gatherings with  Friends and Family: More than three times a week    Attends Religious Services: Patient declined    Active Member of Clubs or Organizations: No    Attends Engineer, Structural: Not on file    Marital Status: Never married  Depression (PHQ2-9): Low Risk (03/05/2024)   Depression (PHQ2-9)    PHQ-2 Score: 2  Alcohol Screen: Low Risk (10/27/2024)   Alcohol Screen    Last Alcohol Screening Score (AUDIT): 4  Housing: High Risk (10/27/2024)   Epic    Unable to Pay for Housing in the Last Year: Yes    Number of Times Moved in the Last Year: Not on file    Homeless in the Last Year: No  Utilities: Not At Risk (07/18/2023)   [employer]HC Utilities    Threatened with loss of utilities: No  Health Literacy: Adequate Health Literacy (07/18/2023)   B1300 Health Literacy    Frequency of need for help with medical instructions: Never    Allergies[1]  Outpatient Medications Prior to Visit  Medication Sig Dispense Refill   fluconazole  (DIFLUCAN ) 150 MG tablet Take 1 tablet (150 mg total) by mouth daily as needed (for vaginal candidiasis prior to periods). 15 tablet 1   amLODipine  (NORVASC ) 10 MG tablet Take 1 tablet (10 mg total) by mouth daily. Dose increase 03/05/24 90 tablet 0   fluticasone  (FLONASE ) 50 MCG/ACT nasal spray Place 2 sprays into both nostrils daily. 16 g 6   clotrimazole  (LOTRIMIN ) 1 % cream Apply 1 Application topically 2 (two) times daily. (Patient not taking: Reported on 10/27/2024) 30 g 0   No facility-administered medications prior to visit.     ROS Review of Systems  Constitutional:  Negative for activity change and appetite change.  HENT:  Negative for sinus pressure and sore throat.   Respiratory:  Negative for chest tightness, shortness of breath and wheezing.   Cardiovascular:  Negative for chest pain and palpitations.  Gastrointestinal:  Negative for abdominal distention, abdominal pain and constipation.  Genitourinary: Negative.   Musculoskeletal: Negative.         See HPI  Psychiatric/Behavioral:  Negative for behavioral problems and dysphoric mood.     Objective:  BP 134/82   Pulse 80   Temp 98.4 F (36.9 C) (Oral)   Ht 5' 7 (1.702 m)   Wt 137 lb (62.1 kg)   SpO2 100%   BMI 21.46 kg/m      10/27/2024    3:05 PM 03/05/2024   11:51 AM 03/05/2024   11:04 AM  BP/Weight  Systolic BP 134 147 154  Diastolic BP 82 91 101  Wt. (Lbs) 137  136  BMI 21.46 kg/m2  21.3 kg/m2    Wt Readings from Last 3 Encounters:  10/27/24 137 lb (62.1 kg)  03/05/24 136 lb (61.7 kg)  07/18/23 129 lb 6.4 oz (58.7 kg)      Physical Exam Constitutional:      Appearance: She is well-developed.  Cardiovascular:     Rate and Rhythm: Normal rate.     Heart sounds: Normal heart sounds. No murmur heard. Pulmonary:     Effort: Pulmonary effort is normal.     Breath sounds: Normal breath sounds. No wheezing or rales.  Chest:     Chest wall: No tenderness.  Abdominal:     General: Bowel sounds are normal. There is no distension.     Palpations: Abdomen is soft. There is no mass.     Tenderness: There is no abdominal tenderness.  Musculoskeletal:        General: Normal range of motion.     Right lower leg: No edema.     Left lower leg: No edema.     Comments: Left wrist brace in place  Neurological:     Mental Status: She is alert and oriented to person, place, and time.  Psychiatric:        Mood and Affect: Mood normal.        Latest Ref Rng & Units 01/15/2023    2:34 PM 07/09/2022    2:38 PM 01/03/2022    2:21 PM  CMP  Glucose 70 - 99 mg/dL 90  74  92   BUN 6 - 20 mg/dL 8  7  9    Creatinine 0.57 - 1.00 mg/dL 9.07  9.20  9.23   Sodium 134 - 144 mmol/L 141  139  141   Potassium 3.5 - 5.2 mmol/L 3.8  4.1  3.8   Chloride 96 - 106 mmol/L 104  105  107   CO2 20 - 29 mmol/L 21  21  21    Calcium 8.7 - 10.2 mg/dL 9.0  9.0  9.1   Total Protein 6.0 - 8.5 g/dL 7.2   6.9   Total Bilirubin 0.0 - 1.2 mg/dL 0.2   <9.7   Alkaline Phos 44 - 121 IU/L 76   66    AST 0 - 40 IU/L 20   17   ALT 0 - 32 IU/L 10   11     Lipid Panel     Component Value Date/Time   CHOL 113 01/03/2022 1421   TRIG 37 01/03/2022 1421   HDL 57 01/03/2022 1421   CHOLHDL 2.0 01/03/2022 1421   CHOLHDL 3.2 Ratio 10/14/2009 2108   VLDL 9 10/14/2009 2108   LDLCALC 46 01/03/2022 1421    CBC    Component Value Date/Time   WBC 8.8 01/15/2023 1434   WBC 9.2 05/18/2019 2021   RBC 4.06 01/15/2023 1434   RBC 4.02 05/18/2019 2021   HGB 12.0 01/15/2023 1434   HCT 35.8 01/15/2023 1434   PLT 191 01/15/2023 1434   MCV 88 01/15/2023 1434   MCH 29.6 01/15/2023 1434   MCH 29.4 05/18/2019 2021   MCHC 33.5 01/15/2023 1434   MCHC 33.5 05/18/2019 2021   RDW 13.5 01/15/2023 1434   LYMPHSABS 3.9 (H) 01/15/2023 1434   MONOABS 0.4 04/08/2018 1521   EOSABS 0.0 01/15/2023 1434   BASOSABS 0.0 01/15/2023 1434    Lab Results  Component Value Date   HGBA1C 5.5 (A) 10/27/2024    Lab Results  Component Value Date  HGBA1C 5.5 (A) 10/27/2024   HGBA1C 5.4 (A) 03/05/2024   HGBA1C 5.7 07/18/2023       Assessment & Plan Essential hypertension Blood pressure well-controlled with current medication. - Continue Amlodipine  10 mg daily. - Ordered blood work for kidney and liver function. - She had a bag of chips 2 hours ago hence I wil make no regimen changes if cholesterol is elevated. -Counseled on blood pressure goal of less than 130/80, low-sodium, DASH diet, medication compliance, 150 minutes of moderate intensity exercise per week. Discussed medication compliance, adverse effects.   Prediabetes A1c levels normal, indicating good glucose control. -She has been successful in reversing her Pre Diabetes - Ordered blood work to monitor glucose levels.   Chronic Sinusitis -Stable -Refilled Flonase    General health maintenance Routine health maintenance discussed. Encounter for vaccine administration- Administered flu shot. - Refilled Amlodipine  90-day supply. -  Refilled Fluticasone  nasal spray.       Meds ordered this encounter  Medications   amLODipine  (NORVASC ) 10 MG tablet    Sig: Take 1 tablet (10 mg total) by mouth daily. Dose increase 03/05/24    Dispense:  90 tablet    Refill:  1   fluticasone  (FLONASE ) 50 MCG/ACT nasal spray    Sig: Place 2 sprays into both nostrils daily.    Dispense:  16 g    Refill:  6    Follow-up: Return in about 6 months (around 04/26/2025) for Chronic medical conditions.       Corrina Sabin, MD, FAAFP. [facility] [place], [place] [phone]   10/27/2024, 3:30 PM    [1]  Allergies Allergen Reactions   Acetaminophen  Nausea Only   Asa [Aspirin] Nausea Only   "

## 2024-10-28 ENCOUNTER — Ambulatory Visit: Payer: Self-pay | Admitting: Family Medicine

## 2024-10-28 LAB — LP+NON-HDL CHOLESTEROL
Cholesterol, Total: 112 mg/dL (ref 100–199)
HDL: 48 mg/dL
LDL Chol Calc (NIH): 49 mg/dL (ref 0–99)
Total Non-HDL-Chol (LDL+VLDL): 64 mg/dL (ref 0–129)
Triglycerides: 75 mg/dL (ref 0–149)
VLDL Cholesterol Cal: 15 mg/dL (ref 5–40)

## 2024-10-28 LAB — CBC WITH DIFFERENTIAL/PLATELET
Basophils Absolute: 0 x10E3/uL (ref 0.0–0.2)
Basos: 0 %
EOS (ABSOLUTE): 0.1 x10E3/uL (ref 0.0–0.4)
Eos: 1 %
Hematocrit: 41.5 % (ref 34.0–46.6)
Hemoglobin: 13.8 g/dL (ref 11.1–15.9)
Immature Grans (Abs): 0 x10E3/uL (ref 0.0–0.1)
Immature Granulocytes: 0 %
Lymphocytes Absolute: 4.4 x10E3/uL — ABNORMAL HIGH (ref 0.7–3.1)
Lymphs: 47 %
MCH: 30.3 pg (ref 26.6–33.0)
MCHC: 33.3 g/dL (ref 31.5–35.7)
MCV: 91 fL (ref 79–97)
Monocytes Absolute: 0.8 x10E3/uL (ref 0.1–0.9)
Monocytes: 9 %
Neutrophils Absolute: 4 x10E3/uL (ref 1.4–7.0)
Neutrophils: 43 %
Platelets: 211 x10E3/uL (ref 150–450)
RBC: 4.56 x10E6/uL (ref 3.77–5.28)
RDW: 14.2 % (ref 11.7–15.4)
WBC: 9.3 x10E3/uL (ref 3.4–10.8)

## 2024-10-28 LAB — CMP14+EGFR
ALT: 39 IU/L — ABNORMAL HIGH (ref 0–32)
AST: 34 IU/L (ref 0–40)
Albumin: 4.1 g/dL (ref 3.9–4.9)
Alkaline Phosphatase: 114 IU/L (ref 41–116)
BUN/Creatinine Ratio: 9 (ref 9–23)
BUN: 7 mg/dL (ref 6–20)
Bilirubin Total: 0.2 mg/dL (ref 0.0–1.2)
CO2: 21 mmol/L (ref 20–29)
Calcium: 8.8 mg/dL (ref 8.7–10.2)
Chloride: 104 mmol/L (ref 96–106)
Creatinine, Ser: 0.79 mg/dL (ref 0.57–1.00)
Globulin, Total: 3 g/dL (ref 1.5–4.5)
Glucose: 81 mg/dL (ref 70–99)
Potassium: 4.2 mmol/L (ref 3.5–5.2)
Sodium: 138 mmol/L (ref 134–144)
Total Protein: 7.1 g/dL (ref 6.0–8.5)
eGFR: 98 mL/min/1.73

## 2024-10-30 ENCOUNTER — Other Ambulatory Visit: Payer: Self-pay

## 2025-04-26 ENCOUNTER — Ambulatory Visit: Payer: Self-pay | Admitting: Family Medicine
